# Patient Record
Sex: Female | Born: 1945 | Race: White | Hispanic: No | Marital: Married | Smoking: Former smoker
Health system: Southern US, Community
[De-identification: ages and names within clinical notes are randomized; demographics above are authoritative.]

## PROBLEM LIST (undated history)

## (undated) DIAGNOSIS — H269 Unspecified cataract: Secondary | ICD-10-CM

## (undated) DIAGNOSIS — M1990 Unspecified osteoarthritis, unspecified site: Secondary | ICD-10-CM

## (undated) DIAGNOSIS — Z86010 Personal history of colonic polyps: Secondary | ICD-10-CM

## (undated) DIAGNOSIS — M722 Plantar fascial fibromatosis: Secondary | ICD-10-CM

## (undated) DIAGNOSIS — I10 Essential (primary) hypertension: Secondary | ICD-10-CM

## (undated) DIAGNOSIS — M79673 Pain in unspecified foot: Secondary | ICD-10-CM

## (undated) DIAGNOSIS — M810 Age-related osteoporosis without current pathological fracture: Secondary | ICD-10-CM

## (undated) DIAGNOSIS — E119 Type 2 diabetes mellitus without complications: Secondary | ICD-10-CM

## (undated) DIAGNOSIS — K219 Gastro-esophageal reflux disease without esophagitis: Secondary | ICD-10-CM

## (undated) HISTORY — PX: OTHER SURGICAL HISTORY: SHX169

## (undated) HISTORY — PX: BREAST LUMPECTOMY: SHX2

## (undated) HISTORY — DX: Personal history of colonic polyps: Z86.010

## (undated) HISTORY — DX: Type 2 diabetes mellitus without complications: E11.9

## (undated) HISTORY — DX: Unspecified cataract: H26.9

## (undated) HISTORY — DX: Plantar fascial fibromatosis: M72.2

## (undated) HISTORY — DX: Age-related osteoporosis without current pathological fracture: M81.0

## (undated) HISTORY — PX: FINGER SURGERY: SHX640

## (undated) HISTORY — DX: Pain in unspecified foot: M79.673

## (undated) HISTORY — PX: COLONOSCOPY: SHX174

---

## 2006-08-21 ENCOUNTER — Ambulatory Visit: Payer: Self-pay | Admitting: Internal Medicine

## 2006-09-04 ENCOUNTER — Ambulatory Visit: Payer: Self-pay | Admitting: Internal Medicine

## 2006-09-04 ENCOUNTER — Encounter: Payer: Self-pay | Admitting: Internal Medicine

## 2009-07-19 ENCOUNTER — Encounter (INDEPENDENT_AMBULATORY_CARE_PROVIDER_SITE_OTHER): Payer: Self-pay | Admitting: *Deleted

## 2009-08-20 ENCOUNTER — Encounter: Admission: RE | Admit: 2009-08-20 | Discharge: 2009-08-20 | Payer: Self-pay | Admitting: Orthopaedic Surgery

## 2009-12-21 ENCOUNTER — Telehealth: Payer: Self-pay | Admitting: Internal Medicine

## 2011-01-04 ENCOUNTER — Encounter
Admission: RE | Admit: 2011-01-04 | Discharge: 2011-01-09 | Payer: Self-pay | Source: Home / Self Care | Attending: Podiatry | Admitting: Podiatry

## 2011-01-11 ENCOUNTER — Ambulatory Visit: Payer: Medicare Other | Attending: Podiatry | Admitting: Physical Therapy

## 2011-01-11 DIAGNOSIS — R5381 Other malaise: Secondary | ICD-10-CM | POA: Insufficient documentation

## 2011-01-11 DIAGNOSIS — M25673 Stiffness of unspecified ankle, not elsewhere classified: Secondary | ICD-10-CM | POA: Insufficient documentation

## 2011-01-11 DIAGNOSIS — R269 Unspecified abnormalities of gait and mobility: Secondary | ICD-10-CM | POA: Insufficient documentation

## 2011-01-11 DIAGNOSIS — M25676 Stiffness of unspecified foot, not elsewhere classified: Secondary | ICD-10-CM | POA: Insufficient documentation

## 2011-01-11 DIAGNOSIS — IMO0001 Reserved for inherently not codable concepts without codable children: Secondary | ICD-10-CM | POA: Insufficient documentation

## 2011-01-11 DIAGNOSIS — M25579 Pain in unspecified ankle and joints of unspecified foot: Secondary | ICD-10-CM | POA: Insufficient documentation

## 2011-01-11 NOTE — Progress Notes (Signed)
Summary: Changed practices--GI  Phone Note Outgoing Call   Call placed by: Harlow Mares CMA Duncan Dull),  December 21, 2009 1:47 PM Call placed to: Patient Summary of Call: patient states that she changed insurance and she changed practices and has already had a colonoscopy done. I had Lady Gary put a not in the system that the patient changed practices. Initial call taken by: Harlow Mares CMA (AAMA),  December 21, 2009 1:49 PM

## 2011-01-16 ENCOUNTER — Ambulatory Visit: Payer: Medicare Other | Admitting: *Deleted

## 2011-01-18 ENCOUNTER — Ambulatory Visit: Payer: Medicare Other | Admitting: Physical Therapy

## 2011-01-23 ENCOUNTER — Ambulatory Visit: Payer: Medicare Other | Admitting: Physical Therapy

## 2011-01-25 ENCOUNTER — Ambulatory Visit: Payer: Medicare Other | Admitting: *Deleted

## 2011-01-30 ENCOUNTER — Ambulatory Visit: Payer: Medicare Other | Admitting: Physical Therapy

## 2011-02-01 ENCOUNTER — Ambulatory Visit: Payer: Medicare Other | Admitting: Physical Therapy

## 2011-02-06 ENCOUNTER — Ambulatory Visit: Payer: Medicare Other | Admitting: *Deleted

## 2011-02-08 ENCOUNTER — Ambulatory Visit: Payer: Medicare Other | Attending: Podiatry | Admitting: *Deleted

## 2011-02-08 DIAGNOSIS — M25673 Stiffness of unspecified ankle, not elsewhere classified: Secondary | ICD-10-CM | POA: Insufficient documentation

## 2011-02-08 DIAGNOSIS — M25579 Pain in unspecified ankle and joints of unspecified foot: Secondary | ICD-10-CM | POA: Insufficient documentation

## 2011-02-08 DIAGNOSIS — IMO0001 Reserved for inherently not codable concepts without codable children: Secondary | ICD-10-CM | POA: Insufficient documentation

## 2011-02-08 DIAGNOSIS — R269 Unspecified abnormalities of gait and mobility: Secondary | ICD-10-CM | POA: Insufficient documentation

## 2011-02-08 DIAGNOSIS — M25676 Stiffness of unspecified foot, not elsewhere classified: Secondary | ICD-10-CM | POA: Insufficient documentation

## 2011-02-08 DIAGNOSIS — R5381 Other malaise: Secondary | ICD-10-CM | POA: Insufficient documentation

## 2011-03-22 ENCOUNTER — Ambulatory Visit (INDEPENDENT_AMBULATORY_CARE_PROVIDER_SITE_OTHER): Payer: Medicare Other | Admitting: Sports Medicine

## 2011-03-22 ENCOUNTER — Encounter: Payer: Self-pay | Admitting: Sports Medicine

## 2011-03-22 VITALS — BP 147/85 | Ht 61.0 in | Wt 200.0 lb

## 2011-03-22 DIAGNOSIS — M79609 Pain in unspecified limb: Secondary | ICD-10-CM

## 2011-03-22 DIAGNOSIS — M79672 Pain in left foot: Secondary | ICD-10-CM

## 2011-03-22 DIAGNOSIS — M7672 Peroneal tendinitis, left leg: Secondary | ICD-10-CM

## 2011-03-22 DIAGNOSIS — M7750 Other enthesopathy of unspecified foot: Secondary | ICD-10-CM

## 2011-03-22 DIAGNOSIS — M722 Plantar fascial fibromatosis: Secondary | ICD-10-CM

## 2011-03-22 MED ORDER — TRAMADOL HCL 50 MG PO TABS: 50.0000 mg | ORAL_TABLET | Freq: Three times a day (TID) | ORAL | Status: DC | PRN

## 2011-03-22 NOTE — Progress Notes (Signed)
  Subjective:    Patient ID: Robin Lindsey, female    DOB: 27-Dec-1945, 65 y.o.   MRN: [id number]  HPI New patient is here today for her left heel pain that has been previously diagnosed as plantar fasciitis with spurring. She has had this problem since May of last year. She has previously had 6 cortisone injections in the area along with 11 sessions of physical therapy. This was by Dr Pricilla Holm a podiatrist who comes to [facility].  She is going to be [place] facility at [facility] for sessions  in the past. She has tried diclofenac and other NSAIDs which she now  feels she is allergic to due to  GI issues.  Now the pain is more laterally on the left heel. She has also tried and bought a TENS unit that she uses but without relief.   Review of Systems     Objective:   Physical Exam    NAD Left foot Only mildly TTP at med insertion of PF AT non tender Peroneals show swelling on left Lateral foot breakdown w curving of toes 2 - 5 and lat rotation Same changes RT but less Intact Post tib on standing heel raise Full ROM of left ankle  Some pain at back of heel with ankle motion  Resisted testing of strength did not bring out pain  Musculoskeletal ultrasound There is moderate swelling around the peroneal tendons on the left only at the level of the posterior and inferior malleolus. There is a thickened left plantar fascia measuring 0.71 cm and this compares to 0.38 cm on the right. On the lateral portion of the calcaneus there is a spur with some hypoechoic change and some slight fragmentation of the cortex Achilles tendon appears normal bilaterally.    Assessment & Plan:

## 2011-03-28 ENCOUNTER — Other Ambulatory Visit: Payer: Self-pay | Admitting: Dermatology

## 2011-03-28 DIAGNOSIS — M722 Plantar fascial fibromatosis: Secondary | ICD-10-CM | POA: Insufficient documentation

## 2011-03-28 DIAGNOSIS — M79672 Pain in left foot: Secondary | ICD-10-CM | POA: Insufficient documentation

## 2011-03-28 DIAGNOSIS — M7672 Peroneal tendinitis, left leg: Secondary | ICD-10-CM | POA: Insufficient documentation

## 2011-03-28 NOTE — Assessment & Plan Note (Signed)
This seems to be associated on the lateral side with a spur that she is developed from walking abnormally. With this in mind I think we should try a soft heel cup to try to cushion this area that she can use in all of her shoes.

## 2011-03-28 NOTE — Assessment & Plan Note (Signed)
I suspect the peroneal tendinitis is also a compensation injury from walking abnormally. We will have her ice this area. Begin some gentle resistance and range of motion exercises. Okay to use when necessary ibuprofen or Aleve.

## 2011-03-28 NOTE — Assessment & Plan Note (Signed)
She was started on a series of stretches and exercises to try to help her plantar fascia. In addition we changed the padding and support for her shoe.  We tried to make adjustments today but I'm not sure that she will be able to continue to use the custom orthotics that were made. We did use arch straps. We also added a soft heel cup.  I would like to check this back in 6 weeks.

## 2011-05-08 ENCOUNTER — Ambulatory Visit (INDEPENDENT_AMBULATORY_CARE_PROVIDER_SITE_OTHER): Payer: Medicare Other | Admitting: Sports Medicine

## 2011-05-08 DIAGNOSIS — M722 Plantar fascial fibromatosis: Secondary | ICD-10-CM

## 2011-05-08 DIAGNOSIS — M7750 Other enthesopathy of unspecified foot: Secondary | ICD-10-CM

## 2011-05-08 DIAGNOSIS — M79672 Pain in left foot: Secondary | ICD-10-CM

## 2011-05-08 DIAGNOSIS — M79609 Pain in unspecified limb: Secondary | ICD-10-CM

## 2011-05-08 DIAGNOSIS — M7672 Peroneal tendinitis, left leg: Secondary | ICD-10-CM

## 2011-05-08 MED ORDER — NITROGLYCERIN 0.2 MG/HR TD PT24: MEDICATED_PATCH | TRANSDERMAL | Status: DC

## 2011-05-08 NOTE — Assessment & Plan Note (Signed)
Not much change in pain level. Unfortunately we do not have any medications that seem to be a good choice for her. I would ask her to continue icing.

## 2011-05-08 NOTE — Assessment & Plan Note (Addendum)
Will start nitroglycerin patches today.  Given sports insoles for additional cushion with heel wedge with focus on increased support on lateral side.  Follow-up in 6 weeks.  She was advised that the nitroglycerin treatment is based on the possibility that most of her pain is coming from the peroneal tendons.  For the left foot we placed a lateral wedge so that we can support and take pressure off of the peroneal tendons. Recheck this in 6 weeks.

## 2011-05-08 NOTE — Assessment & Plan Note (Signed)
Continue exercises for this. This may be chronic change and not the key reason why she has ongoing pain.

## 2011-05-08 NOTE — Progress Notes (Signed)
  Subjective:    Patient ID: Robin Lindsey, female    DOB: 11-11-1946, 65 y.o.   MRN: [id number]  HPI Here for 6 week follow-up of left foot pain  Was thought to be multifactorial due to enlarged left plantar fascia, lateral calcaneal spur, and peroneal tendinitis from compensation.  Has been doing stretches daily, not using heel cups as she was wearing open heeled shoes.  Notes no improvement in pain.  Still using TENS on her foot pain.  Cannot tolerate ultram or NSAIDS.  Tylenol with minimal relief.  Tramadol caused nausea  This patient has had multiple interventions, several injections and has had custom orthotics made. None of the previous treatments have eliminated her left foot pain.   Review of Systems See HPI    Objective:   Physical Exam NAD  Left foot  No TTP at med insertion of PF  No TTP over peroneal tendon but notable swelling of tendon sheath. Full ROM of left ankle  Resisted testing of strength did not bring out pain Pain in heel on weight bearing         Assessment & Plan:

## 2011-12-09 ENCOUNTER — Encounter (HOSPITAL_COMMUNITY): Payer: Self-pay | Admitting: *Deleted

## 2011-12-09 ENCOUNTER — Emergency Department (HOSPITAL_COMMUNITY)
Admission: EM | Admit: 2011-12-09 | Discharge: 2011-12-09 | Disposition: A | Discharge status: Home / Self Care | Payer: Medicare Other | Source: Home / Self Care | Attending: Emergency Medicine | Admitting: Emergency Medicine

## 2011-12-09 DIAGNOSIS — J40 Bronchitis, not specified as acute or chronic: Secondary | ICD-10-CM

## 2011-12-09 DIAGNOSIS — J069 Acute upper respiratory infection, unspecified: Secondary | ICD-10-CM

## 2011-12-09 HISTORY — DX: Essential (primary) hypertension: I10

## 2011-12-09 MED ORDER — BENZONATATE 200 MG PO CAPS: 200.0000 mg | ORAL_CAPSULE | Freq: Three times a day (TID) | ORAL | Status: AC | PRN

## 2011-12-09 MED ORDER — AZITHROMYCIN 250 MG PO TABS: ORAL_TABLET | ORAL | Status: AC

## 2011-12-09 MED ORDER — ALBUTEROL SULFATE HFA 108 (90 BASE) MCG/ACT IN AERS: 1.0000 | INHALATION_SPRAY | Freq: Four times a day (QID) | RESPIRATORY_TRACT | Status: DC | PRN

## 2011-12-09 NOTE — ED Provider Notes (Signed)
History     CSN: [id number]  Arrival date & time 12/09/11  0927   First MD Initiated Contact with Patient 12/09/11 [phone]      Chief Complaint  Patient presents with  . Nasal Congestion  . Cough  . Joint Pain    (Consider location/radiation/quality/duration/timing/severity/associated sxs/prior treatment) HPI Comments: Robin Lindsey is a 65 year old female who has had a five-day history of chest congestion, a raw feeling in her chest, cough productive of yellow-brown sputum, nasal congestion with clear rhinorrhea, scratchy throat, left ear pain, and has felt feverish, chilled, and aching. She has had no specific exposures.  Patient is a 65 y.o. female presenting with cough.  Cough Associated symptoms include chills, ear pain, rhinorrhea and sore throat. Pertinent negatives include no shortness of breath, no wheezing and no eye redness.    Past Medical History  Diagnosis Date  . Hypertension     Past Surgical History  Procedure Date  . Breast lumpectomy     History reviewed. No pertinent family history.  History  Substance Use Topics  . Smoking status: Never Smoker   . Smokeless tobacco: Not on file  . Alcohol Use: No    OB History    Grav Para Term Preterm Abortions TAB SAB Ect Mult Living                  Review of Systems  Constitutional: Positive for fever, chills and fatigue.  HENT: Positive for ear pain, congestion, sore throat and rhinorrhea. Negative for sneezing, neck stiffness, voice change and postnasal drip.   Eyes: Negative for pain, discharge and redness.  Respiratory: Positive for cough. Negative for chest tightness, shortness of breath and wheezing.   Gastrointestinal: Negative for nausea, vomiting, abdominal pain and diarrhea.  Skin: Negative for rash.    Allergies  Aspirin and Nsaids  Home Medications   Current Outpatient Rx  Name Route Sig Dispense Refill  . GUAIFENESIN ER 600 MG PO TB12 Oral Take 600 mg by mouth 2 (two) times daily.      Marland Kitchen  OMEPRAZOLE 20 MG PO CPDR Oral Take 20 mg by mouth 2 (two) times daily as needed.      Marland Kitchen VALSARTAN-HYDROCHLOROTHIAZIDE 320-25 MG PO TABS Oral Take 1 tablet by mouth daily.      . ALBUTEROL SULFATE HFA 108 (90 BASE) MCG/ACT IN AERS Inhalation Inhale 1-2 puffs into the lungs every 6 (six) hours as needed for wheezing. 1 Inhaler 0  . AZITHROMYCIN 250 MG PO TABS  Take as directed. 6 tablet 0  . BENZONATATE 200 MG PO CAPS Oral Take 1 capsule (200 mg total) by mouth 3 (three) times daily as needed for cough. 30 capsule 0  . NITROGLYCERIN 0.2 MG/HR TD PT24  Apply one-quarter patch to affected area daily.  Remove after 24 hours. 30 patch 1    BP 137/71  Pulse 93  Temp(Src) 98.7 F (37.1 C) (Oral)  Resp 18  SpO2 97%  Physical Exam  Nursing note and vitals reviewed. Constitutional: She appears well-developed and well-nourished. No distress.  HENT:  Head: Normocephalic and atraumatic.  Right Ear: External ear normal.  Left Ear: External ear normal.  Nose: Nose normal.  Mouth/Throat: Oropharynx is clear and moist. No oropharyngeal exudate.  Eyes: Conjunctivae and EOM are normal. Pupils are equal, round, and reactive to light. Right eye exhibits no discharge. Left eye exhibits no discharge.  Neck: Normal range of motion. Neck supple.  Cardiovascular: Normal rate, regular rhythm and normal heart sounds.  Pulmonary/Chest: Effort normal and breath sounds normal. No stridor. No respiratory distress. She has no wheezes. She has no rales. She exhibits no tenderness.  Lymphadenopathy:    She has no cervical adenopathy.  Skin: Skin is warm and dry. No rash noted. She is not diaphoretic.    ED Course  Procedures (including critical care time)  Labs Reviewed - No data to display No results found.   1. Viral upper respiratory illness   2. Bronchitis       MDM  She has a viral upper respiratory infection with bronchitis. We'll treat with a Z-Pak, Tessalon Perles, and albuterol  inhaler.        Roque Lias, MD 12/09/11 [phone]

## 2011-12-09 NOTE — ED Notes (Signed)
Pt with c/o congestion/cough/joint pain onset of symptoms x 4 days - ribs sore from coughing

## 2011-12-25 DIAGNOSIS — H524 Presbyopia: Secondary | ICD-10-CM | POA: Diagnosis not present

## 2011-12-25 DIAGNOSIS — H2510 Age-related nuclear cataract, unspecified eye: Secondary | ICD-10-CM | POA: Diagnosis not present

## 2011-12-25 DIAGNOSIS — H40019 Open angle with borderline findings, low risk, unspecified eye: Secondary | ICD-10-CM | POA: Diagnosis not present

## 2011-12-25 DIAGNOSIS — H5200 Hypermetropia, unspecified eye: Secondary | ICD-10-CM | POA: Diagnosis not present

## 2012-01-08 DIAGNOSIS — H2510 Age-related nuclear cataract, unspecified eye: Secondary | ICD-10-CM | POA: Diagnosis not present

## 2012-01-08 DIAGNOSIS — H3589 Other specified retinal disorders: Secondary | ICD-10-CM | POA: Diagnosis not present

## 2012-01-08 DIAGNOSIS — H40019 Open angle with borderline findings, low risk, unspecified eye: Secondary | ICD-10-CM | POA: Diagnosis not present

## 2012-01-08 DIAGNOSIS — H40039 Anatomical narrow angle, unspecified eye: Secondary | ICD-10-CM | POA: Diagnosis not present

## 2012-01-28 DIAGNOSIS — H35379 Puckering of macula, unspecified eye: Secondary | ICD-10-CM | POA: Diagnosis not present

## 2012-01-28 DIAGNOSIS — H4011X0 Primary open-angle glaucoma, stage unspecified: Secondary | ICD-10-CM | POA: Diagnosis not present

## 2012-01-28 DIAGNOSIS — H2589 Other age-related cataract: Secondary | ICD-10-CM | POA: Diagnosis not present

## 2012-01-29 DIAGNOSIS — M722 Plantar fascial fibromatosis: Secondary | ICD-10-CM | POA: Diagnosis not present

## 2012-01-29 DIAGNOSIS — M25569 Pain in unspecified knee: Secondary | ICD-10-CM | POA: Diagnosis not present

## 2012-01-29 DIAGNOSIS — IMO0002 Reserved for concepts with insufficient information to code with codable children: Secondary | ICD-10-CM | POA: Diagnosis not present

## 2012-02-08 ENCOUNTER — Encounter (HOSPITAL_COMMUNITY): Payer: Self-pay | Admitting: Pharmacy Technician

## 2012-02-11 ENCOUNTER — Encounter (HOSPITAL_COMMUNITY): Payer: Self-pay

## 2012-02-11 ENCOUNTER — Other Ambulatory Visit: Payer: Self-pay

## 2012-02-11 ENCOUNTER — Encounter (HOSPITAL_COMMUNITY)
Admission: RE | Admit: 2012-02-11 | Discharge: 2012-02-11 | Disposition: A | Discharge status: Home / Self Care | Payer: Medicare Other | Source: Ambulatory Visit | Attending: Ophthalmology | Admitting: Ophthalmology

## 2012-02-11 DIAGNOSIS — Z01810 Encounter for preprocedural cardiovascular examination: Secondary | ICD-10-CM | POA: Diagnosis not present

## 2012-02-11 DIAGNOSIS — I10 Essential (primary) hypertension: Secondary | ICD-10-CM | POA: Diagnosis not present

## 2012-02-11 DIAGNOSIS — H2589 Other age-related cataract: Secondary | ICD-10-CM | POA: Diagnosis not present

## 2012-02-11 DIAGNOSIS — Z79899 Other long term (current) drug therapy: Secondary | ICD-10-CM | POA: Diagnosis not present

## 2012-02-11 DIAGNOSIS — Z01812 Encounter for preprocedural laboratory examination: Secondary | ICD-10-CM | POA: Diagnosis not present

## 2012-02-11 HISTORY — DX: Unspecified osteoarthritis, unspecified site: M19.90

## 2012-02-11 HISTORY — DX: Gastro-esophageal reflux disease without esophagitis: K21.9

## 2012-02-11 LAB — BASIC METABOLIC PANEL
Chloride: 102 mEq/L (ref 96–112)
GFR calc Af Amer: 69 mL/min — ABNORMAL LOW (ref >90)
GFR calc non Af Amer: 60 mL/min — ABNORMAL LOW (ref >90)
Potassium: 3.9 mEq/L (ref 3.5–5.1)
Sodium: 140 mEq/L (ref 135–145)

## 2012-02-11 LAB — HEMOGLOBIN AND HEMATOCRIT, BLOOD
HCT: 40.3 % (ref 36.0–46.0)
Hemoglobin: 13.5 g/dL (ref 12.0–15.0)

## 2012-02-11 NOTE — Patient Instructions (Addendum)
20 Robin Lindsey  02/11/2012   Your procedure is scheduled on:  02/14/12  Report to [facility] at 10:20 AM.  Call this number if you have problems Robin morning of surgery: [phone]   Remember:   Do not eat food:After Midnight.  May have clear liquids:until Midnight .  Clear liquids include soda, tea, black coffee, apple or grape juice, broth.  Take these medicines Robin morning of surgery with A SIP OF WATER: Prilosec and Diovan-HCT.   Do not wear jewelry, make-up or nail polish.  Do not wear lotions, powders, or perfumes. You may wear deodorant.  Do not shave 48 hours prior to surgery.  Do not bring valuables to Robin hospital.  Contacts, dentures or bridgework may not be worn into surgery.  Leave suitcase in Robin car. After surgery it may be brought to your room.  For patients admitted to Robin hospital, checkout time is 11:00 AM Robin day of discharge.   Patients discharged Robin day of surgery will not be allowed to drive home.  Name and phone number of your driver:   Special Instructions: N/A   Please read over Robin following fact sheets that you were given: Pain Booklet, Anesthesia Post-op Instructions and Care and Recovery After Surgery    Cataract A cataract is a clouding of Robin lens of Robin eye. When a lens becomes cloudy, vision is reduced based on Robin degree and nature of Robin clouding. Many cataracts reduce vision to some degree. Some cataracts make people more near-sighted as they develop. Other cataracts increase glare. Cataracts that are ignored and become worse can sometimes look Robin Lindsey. Robin Lindsey color can be seen through Robin pupil. CAUSES   Aging. However, cataracts may occur at any age, even in newborns.   Certain drugs.   Trauma to Robin eye.   Certain diseases such as diabetes.   Specific eye diseases such as chronic inflammation inside Robin eye or a sudden attack of a rare form of glaucoma.   Inherited or acquired medical problems.  SYMPTOMS   Gradual, progressive drop in  vision in Robin affected eye.   Severe, rapid visual loss. This most often happens when trauma is Robin cause.  DIAGNOSIS  To detect a cataract, an eye doctor examines Robin lens. Cataracts are best diagnosed with an exam of Robin eyes with Robin pupils enlarged (dilated) by drops.  TREATMENT  For an early cataract, vision may improve by using different eyeglasses or stronger lighting. If that does not help your vision, surgery is Robin only effective treatment. A cataract needs to be surgically removed when vision loss interferes with your everyday activities, such as driving, reading, or watching TV. A cataract may also have to be removed if it prevents examination or treatment of another eye problem. Surgery removes Robin cloudy lens and usually replaces it with a substitute lens (intraocular lens, IOL).  At a time when both you and your doctor agree, Robin cataract will be surgically removed. If you have cataracts in both eyes, only one is usually removed at a time. This allows Robin operated eye to heal and be out of danger from any possible problems after surgery (such as infection or poor wound healing). In rare cases, a cataract may be doing damage to your eye. In these cases, your caregiver may advise surgical removal right away. Robin vast majority of people who have cataract surgery have better vision afterward. HOME CARE INSTRUCTIONS  If you are not planning surgery, you may be asked to do  Robin following:  Use different eyeglasses.   Use stronger or brighter lighting.   Ask your eye doctor about reducing your medicine dose or changing medicines if it is thought that a medicine caused your cataract. Changing medicines does not make Robin cataract go away on its own.   Become familiar with your surroundings. Poor vision can lead to injury. Avoid bumping into things on Robin affected side. You are at a higher risk for tripping or falling.   Exercise extreme care when driving or operating machinery.   Wear  sunglasses if you are sensitive to bright light or experiencing problems with glare.  SEEK IMMEDIATE MEDICAL CARE IF:   You have a worsening or sudden vision loss.   You notice redness, swelling, or increasing pain in Robin eye.   You have a fever.  Document Released: 11/26/2005 Document Revised: 11/15/2011 Document Reviewed: 07/20/2011 [facility] Patient Information 2012 [place], [place].   PATIENT INSTRUCTIONS POST-ANESTHESIA  IMMEDIATELY FOLLOWING SURGERY:  Do not drive or operate machinery for Robin first twenty four hours after surgery.  Do not make any important decisions for twenty four hours after surgery or while taking narcotic pain medications or sedatives.  If you develop intractable nausea and vomiting or a severe headache please notify your doctor immediately.  FOLLOW-UP:  Please make an appointment with your surgeon as instructed. You do not need to follow up with anesthesia unless specifically instructed to do so.  WOUND CARE INSTRUCTIONS (if applicable):  Keep a dry clean dressing on Robin anesthesia/puncture wound site if there is drainage.  Once Robin wound has quit draining you may leave it open to air.  Generally you should leave Robin bandage intact for twenty four hours unless there is drainage.  If Robin epidural site drains for more than 36-48 hours please call Robin anesthesia department.  QUESTIONS?:  Please feel free to call your physician or Robin hospital operator if you have any questions, and they will be happy to assist you.     [facility] Anesthesia Department [address] [place] [phone]

## 2012-02-13 MED ORDER — NEOMYCIN-POLYMYXIN-DEXAMETH 3.5-10000-0.1 OP OINT
TOPICAL_OINTMENT | OPHTHALMIC | Status: AC
  Filled 2012-02-13: qty 3.5

## 2012-02-13 MED ORDER — LIDOCAINE HCL 3.5 % OP GEL
OPHTHALMIC | Status: AC
  Administered 2012-02-14: 1 via OPHTHALMIC
  Filled 2012-02-13: qty 5

## 2012-02-13 MED ORDER — LIDOCAINE HCL (PF) 1 % IJ SOLN
INTRAMUSCULAR | Status: AC
  Filled 2012-02-13: qty 2

## 2012-02-13 MED ORDER — TETRACAINE HCL 0.5 % OP SOLN
OPHTHALMIC | Status: AC
  Administered 2012-02-14: 1 [drp] via OPHTHALMIC
  Filled 2012-02-13: qty 2

## 2012-02-13 MED ORDER — CYCLOPENTOLATE-PHENYLEPHRINE 0.2-1 % OP SOLN
OPHTHALMIC | Status: AC
  Administered 2012-02-14: 1 [drp] via OPHTHALMIC
  Filled 2012-02-13: qty 2

## 2012-02-14 ENCOUNTER — Ambulatory Visit (HOSPITAL_COMMUNITY): Payer: Medicare Other | Admitting: Anesthesiology

## 2012-02-14 ENCOUNTER — Encounter (HOSPITAL_COMMUNITY)
Admission: RE | Disposition: A | Discharge status: Home / Self Care | Payer: Self-pay | Source: Ambulatory Visit | Attending: Ophthalmology

## 2012-02-14 ENCOUNTER — Encounter (HOSPITAL_COMMUNITY): Payer: Self-pay | Admitting: *Deleted

## 2012-02-14 ENCOUNTER — Ambulatory Visit (HOSPITAL_COMMUNITY)
Admission: RE | Admit: 2012-02-14 | Discharge: 2012-02-14 | Disposition: A | Discharge status: Home / Self Care | Payer: Medicare Other | Source: Ambulatory Visit | Attending: Ophthalmology | Admitting: Ophthalmology

## 2012-02-14 ENCOUNTER — Encounter (HOSPITAL_COMMUNITY): Payer: Self-pay | Admitting: Anesthesiology

## 2012-02-14 DIAGNOSIS — I10 Essential (primary) hypertension: Secondary | ICD-10-CM | POA: Diagnosis not present

## 2012-02-14 DIAGNOSIS — Z79899 Other long term (current) drug therapy: Secondary | ICD-10-CM | POA: Diagnosis not present

## 2012-02-14 DIAGNOSIS — Z01812 Encounter for preprocedural laboratory examination: Secondary | ICD-10-CM | POA: Insufficient documentation

## 2012-02-14 DIAGNOSIS — H2589 Other age-related cataract: Secondary | ICD-10-CM | POA: Diagnosis not present

## 2012-02-14 DIAGNOSIS — Z01810 Encounter for preprocedural cardiovascular examination: Secondary | ICD-10-CM | POA: Insufficient documentation

## 2012-02-14 DIAGNOSIS — H269 Unspecified cataract: Secondary | ICD-10-CM | POA: Diagnosis not present

## 2012-02-14 HISTORY — PX: CATARACT EXTRACTION W/PHACO: SHX586

## 2012-02-14 SURGERY — PHACOEMULSIFICATION, CATARACT, WITH IOL INSERTION
Anesthesia: Monitor Anesthesia Care | Site: Eye | Laterality: Left | Wound class: Clean

## 2012-02-14 MED ORDER — BSS IO SOLN
INTRAOCULAR | Status: DC | PRN
  Administered 2012-02-14: 15 mL via INTRAOCULAR

## 2012-02-14 MED ORDER — FENTANYL CITRATE 0.05 MG/ML IJ SOLN: 25.0000 ug | INTRAMUSCULAR | Status: DC | PRN

## 2012-02-14 MED ORDER — ONDANSETRON HCL 4 MG/2ML IJ SOLN: 4.0000 mg | Freq: Once | INTRAMUSCULAR | Status: DC | PRN

## 2012-02-14 MED ORDER — PROVISC 10 MG/ML IO SOLN
INTRAOCULAR | Status: DC | PRN
  Administered 2012-02-14: 8.5 mg via INTRAOCULAR

## 2012-02-14 MED ORDER — MIDAZOLAM HCL 2 MG/2ML IJ SOLN
INTRAMUSCULAR | Status: AC
  Filled 2012-02-14: qty 2

## 2012-02-14 MED ORDER — TETRACAINE HCL 0.5 % OP SOLN
1.0000 [drp] | OPHTHALMIC | Status: AC
  Administered 2012-02-14 (×3): 1 [drp] via OPHTHALMIC

## 2012-02-14 MED ORDER — LACTATED RINGERS IV SOLN
INTRAVENOUS | Status: DC
  Administered 2012-02-14: 1000 mL via INTRAVENOUS

## 2012-02-14 MED ORDER — MIDAZOLAM HCL 2 MG/2ML IJ SOLN
1.0000 mg | INTRAMUSCULAR | Status: DC | PRN
  Administered 2012-02-14 (×2): 2 mg via INTRAVENOUS

## 2012-02-14 MED ORDER — MIDAZOLAM HCL 5 MG/5ML IJ SOLN
INTRAMUSCULAR | Status: DC | PRN
  Administered 2012-02-14: 2 mg via INTRAVENOUS

## 2012-02-14 MED ORDER — MIDAZOLAM HCL 2 MG/2ML IJ SOLN
INTRAMUSCULAR | Status: AC
  Administered 2012-02-14: 2 mg via INTRAVENOUS
  Filled 2012-02-14: qty 2

## 2012-02-14 MED ORDER — ACETAZOLAMIDE 250 MG PO TABS
ORAL_TABLET | ORAL | Status: AC
  Filled 2012-02-14: qty 1

## 2012-02-14 MED ORDER — POVIDONE-IODINE 5 % OP SOLN
OPHTHALMIC | Status: DC | PRN
  Administered 2012-02-14: 1 via OPHTHALMIC

## 2012-02-14 MED ORDER — LIDOCAINE 3.5 % OP GEL OPTIME - NO CHARGE
OPHTHALMIC | Status: DC | PRN
  Administered 2012-02-14: 2 [drp] via OPHTHALMIC

## 2012-02-14 MED ORDER — PHENYLEPHRINE HCL 2.5 % OP SOLN
1.0000 [drp] | OPHTHALMIC | Status: AC
  Administered 2012-02-14 (×3): 1 [drp] via OPHTHALMIC

## 2012-02-14 MED ORDER — NEOMYCIN-POLYMYXIN-DEXAMETH 0.1 % OP OINT
TOPICAL_OINTMENT | OPHTHALMIC | Status: DC | PRN
  Administered 2012-02-14: 1 via OPHTHALMIC

## 2012-02-14 MED ORDER — LIDOCAINE HCL (PF) 1 % IJ SOLN
INTRAMUSCULAR | Status: DC | PRN
  Administered 2012-02-14: .4 mL

## 2012-02-14 MED ORDER — PHENYLEPHRINE HCL 2.5 % OP SOLN
OPHTHALMIC | Status: AC
  Administered 2012-02-14: 1 [drp] via OPHTHALMIC
  Filled 2012-02-14: qty 2

## 2012-02-14 MED ORDER — CYCLOPENTOLATE-PHENYLEPHRINE 0.2-1 % OP SOLN
1.0000 [drp] | OPHTHALMIC | Status: AC
  Administered 2012-02-14 (×3): 1 [drp] via OPHTHALMIC

## 2012-02-14 MED ORDER — EPINEPHRINE HCL 1 MG/ML IJ SOLN
INTRAOCULAR | Status: DC | PRN
  Administered 2012-02-14: 11:00:00

## 2012-02-14 MED ORDER — ACETAZOLAMIDE 250 MG PO TABS
250.0000 mg | ORAL_TABLET | Freq: Once | ORAL | Status: AC
  Administered 2012-02-14: 250 mg via ORAL

## 2012-02-14 MED ORDER — LIDOCAINE HCL 3.5 % OP GEL
1.0000 "application " | Freq: Once | OPHTHALMIC | Status: AC
  Administered 2012-02-14: 1 via OPHTHALMIC

## 2012-02-14 MED ORDER — EPINEPHRINE HCL 1 MG/ML IJ SOLN
INTRAMUSCULAR | Status: AC
  Filled 2012-02-14: qty 1

## 2012-02-14 SURGICAL SUPPLY — 32 items

## 2012-02-14 NOTE — Discharge Instructions (Signed)
Robin Lindsey  02/14/2012     Instructions  1. Use medications as Instructed.  Shake well before use. Wait 5 minutes between drops.  {OPHTHALMIC ANTIBIOTICS:22167} 4 times a day x 1 week.  {OPHTHALMIC ANTI-INFLAMMATORY:22168} 2 times a day x 4 weeks.  {OPHTHALMIC STEROID:22169} 4 times a day - week 1   3 times a day - Week 2, 2 times a day- Week 3, 1 time a day - Week 4.  2. Do not rub the operative eye. Do not swim underwater for 2 weeks.  3. You may remove the clear shield and resume your normal activities the day after  Surgery. Your eyes may feel more comfortable if you wear dark glasses outside.  4. Call our office at [phone] if you have sudden change in vision, extreme redness or pain. Some fluctuation in vision is normal after surgery. If you have an emergency after hours, call Dr. Alto Denver at [phone].  5. It is important that you attend all of your follow-up appointments.        Follow-up:{follow up:32580} with Gemma Payor, MD.   Dr. Lahoma Crocker: [phone]  Dr. Lita Mains: [phone]  Dr. Alto Denver: [phone]   If you find that you cannot contact your physician, but feel that your signs and   Symptoms warrant a physician's attention, call the Emergency Room at   [phone].   Other{NA AND WNUUVOZD:66440}.

## 2012-02-14 NOTE — Transfer of Care (Signed)
Immediate Anesthesia Transfer of Care Note  Patient: Robin Lindsey  Procedure(s) Performed: Procedure(s) (LRB): CATARACT EXTRACTION PHACO AND INTRAOCULAR LENS PLACEMENT (IOC) (Left)  Patient Location: Shortstay  Anesthesia Type: MAC  Level of Consciousness: awake  Airway & Oxygen Therapy: Patient Spontanous Breathing   Post-op Assessment: Report given to PACU RN, Post -op Vital signs reviewed and stable and Patient moving all extremities  Post vital signs: Reviewed and stable  Complications: No apparent anesthesia complications

## 2012-02-14 NOTE — H&P (Signed)
I have reviewed the H&P, the patient was re-examined, and I have identified no interval changes in medical condition and plan of care since the history and physical of record  

## 2012-02-14 NOTE — Anesthesia Procedure Notes (Signed)
Procedure Name: MAC Date/Time: 02/14/2012 11:25 AM Performed by: Minerva Areola Pre-anesthesia Checklist: Patient identified, Emergency Drugs available, Suction available, Timeout performed and Patient being monitored Patient Re-evaluated:Patient Re-evaluated prior to inductionOxygen Delivery Method: Nasal Cannula

## 2012-02-14 NOTE — Anesthesia Preprocedure Evaluation (Addendum)
Anesthesia Evaluation  Patient identified by MRN, date of birth, ID band Patient awake    Reviewed: Allergy & Precautions, H&P , NPO status , Patient's Chart, lab work & pertinent test results  History of Anesthesia Complications Negative for: history of anesthetic complications  Airway Mallampati: II      Dental  (+) Teeth Intact   Pulmonary neg pulmonary ROS,  breath sounds clear to auscultation        Cardiovascular hypertension, Pt. on medications Rhythm:Regular     Neuro/Psych    GI/Hepatic GERD-  Controlled and Medicated,  Endo/Other    Renal/GU      Musculoskeletal   Abdominal   Peds  Hematology   Anesthesia Other Findings   Reproductive/Obstetrics                           Anesthesia Physical Anesthesia Plan  ASA: II  Anesthesia Plan: MAC   Post-op Pain Management:    Induction: Intravenous  Airway Management Planned: Nasal Cannula  Additional Equipment:   Intra-op Plan:   Post-operative Plan:   Informed Consent: I have reviewed the patients History and Physical, chart, labs and discussed the procedure including the risks, benefits and alternatives for the proposed anesthesia with the patient or authorized representative who has indicated his/her understanding and acceptance.     Plan Discussed with:   Anesthesia Plan Comments:         Anesthesia Quick Evaluation  

## 2012-02-14 NOTE — Anesthesia Postprocedure Evaluation (Signed)
  Anesthesia Post-op Note  Patient: Robin Lindsey  Procedure(s) Performed: Procedure(s) (LRB): CATARACT EXTRACTION PHACO AND INTRAOCULAR LENS PLACEMENT (IOC) (Left)  Patient Location:  Short Stay  Anesthesia Type: MAC  Level of Consciousness: awake  Airway and Oxygen Therapy: Patient Spontanous Breathing  Post-op Pain: none  Post-op Assessment: Post-op Vital signs reviewed, Patient's Cardiovascular Status Stable, Respiratory Function Stable, Patent Airway, No signs of Nausea or vomiting and Pain level controlled  Post-op Vital Signs: Reviewed and stable  Complications: No apparent anesthesia complications

## 2012-02-14 NOTE — Brief Op Note (Signed)
Pre-Op Dx: Cataract OS Post-Op Dx: Cataract OS Surgeon: Deziray Nabi Anesthesia: Topical with MAC Implant: B&L enVista Specimen: None Complications: None 

## 2012-02-15 NOTE — Op Note (Signed)
NAMEDUCHESS, ARMENDAREZ NO.:  [id number]  MEDICAL RECORD NO.:  [id number]  LOCATION:  [facility]                          FACILITY:  [facility]  PHYSICIAN:  Susanne Greenhouse, MD       DATE OF BIRTH:  [birth year]  DATE OF PROCEDURE:  02/14/2012 DATE OF DISCHARGE:  02/14/2012                              OPERATIVE REPORT   PREOPERATIVE DIAGNOSIS:  Combined cataract, left eye, diagnosis code 366.19.  POSTOPERATIVE DIAGNOSIS:  Combined cataract, left eye, diagnosis code 366.19.  OPERATION PERFORMED:  Phacoemulsification with posterior chamber intraocular lens implantation, left eye.  SURGEON:  Bonne Dolores. Garrit Marrow, MD  ANESTHESIA:  General endotracheal anesthesia.  OPERATIVE SUMMARY:  In the preoperative area, dilating drops were placed into the left eye.  The patient was then brought into the operating room where he was placed under general anesthesia.  The eye was then prepped and draped.  Beginning with a 75 blade, a paracentesis port was made at the surgeon's 2 o'clock position.  The anterior chamber was then filled with a 1% nonpreserved lidocaine solution with epinephrine.  This was followed by Viscoat to deepen the chamber.  A small fornix-based peritomy was performed superiorly.  Next, a single iris hook was placed through the limbus superiorly.  A 2.4-mm keratome blade was then used to make a clear corneal incision over the iris hook.  A bent cystotome needle and Utrata forceps were used to create a continuous tear capsulotomy.  Hydrodissection was performed using balanced salt solution on a fine cannula.  The lens nucleus was then removed using phacoemulsification in a quadrant cracking technique.  The cortical material was then removed with irrigation and aspiration.  The capsular bag and anterior chamber were refilled with Provisc.  The wound was widened to approximately 3 mm and a posterior chamber intraocular lens was placed into the capsular bag without difficulty using  an [employer] lens injecting system.  A single 10-0 nylon suture was then used to close the incision as well as stromal hydration.  The Provisc was removed from the anterior chamber and capsular bag with irrigation and aspiration.  At this point, the wounds were tested for leak, which were negative.  The anterior chamber remained deep and stable.  The patient tolerated the procedure well.  There were no operative complications, and he awoke from general anesthesia without problem.  No surgical specimens.  Prosthetic device used is a Cabin crew posterior chamber lens, model MX60, power of 21.5.  Serial number is [id number].          ______________________________ Susanne Greenhouse, MD     KEH/MEDQ  D:  02/14/2012  T:  02/15/2012  Job:  [id number]

## 2012-02-18 ENCOUNTER — Encounter (HOSPITAL_COMMUNITY): Payer: Self-pay | Admitting: Ophthalmology

## 2012-02-25 ENCOUNTER — Encounter (HOSPITAL_COMMUNITY): Payer: Self-pay | Admitting: Pharmacy Technician

## 2012-02-25 DIAGNOSIS — H2589 Other age-related cataract: Secondary | ICD-10-CM | POA: Diagnosis not present

## 2012-02-25 DIAGNOSIS — H35379 Puckering of macula, unspecified eye: Secondary | ICD-10-CM | POA: Diagnosis not present

## 2012-02-25 DIAGNOSIS — H4011X0 Primary open-angle glaucoma, stage unspecified: Secondary | ICD-10-CM | POA: Diagnosis not present

## 2012-02-27 ENCOUNTER — Encounter (HOSPITAL_COMMUNITY)
Admission: RE | Admit: 2012-02-27 | Discharge: 2012-02-27 | Payer: Medicare Other | Source: Ambulatory Visit | Admitting: Ophthalmology

## 2012-02-29 MED ORDER — TETRACAINE HCL 0.5 % OP SOLN
OPHTHALMIC | Status: AC
  Filled 2012-02-29: qty 2

## 2012-02-29 MED ORDER — LIDOCAINE HCL (PF) 1 % IJ SOLN
INTRAMUSCULAR | Status: AC
  Filled 2012-02-29: qty 2

## 2012-02-29 MED ORDER — CYCLOPENTOLATE-PHENYLEPHRINE 0.2-1 % OP SOLN
OPHTHALMIC | Status: AC
  Filled 2012-02-29: qty 2

## 2012-02-29 MED ORDER — LIDOCAINE HCL 3.5 % OP GEL
OPHTHALMIC | Status: AC
  Filled 2012-02-29: qty 5

## 2012-02-29 MED ORDER — NEOMYCIN-POLYMYXIN-DEXAMETH 3.5-10000-0.1 OP OINT
TOPICAL_OINTMENT | OPHTHALMIC | Status: AC
  Filled 2012-02-29: qty 3.5

## 2012-03-03 ENCOUNTER — Ambulatory Visit (HOSPITAL_COMMUNITY): Payer: Medicare Other | Admitting: Anesthesiology

## 2012-03-03 ENCOUNTER — Encounter (HOSPITAL_COMMUNITY)
Admission: RE | Disposition: A | Discharge status: Home / Self Care | Payer: Self-pay | Source: Ambulatory Visit | Attending: Ophthalmology

## 2012-03-03 ENCOUNTER — Encounter (HOSPITAL_COMMUNITY): Payer: Self-pay | Admitting: Anesthesiology

## 2012-03-03 ENCOUNTER — Encounter (HOSPITAL_COMMUNITY): Payer: Self-pay

## 2012-03-03 ENCOUNTER — Ambulatory Visit (HOSPITAL_COMMUNITY)
Admission: RE | Admit: 2012-03-03 | Discharge: 2012-03-03 | Disposition: A | Discharge status: Home / Self Care | Payer: Medicare Other | Source: Ambulatory Visit | Attending: Ophthalmology | Admitting: Ophthalmology

## 2012-03-03 DIAGNOSIS — H2589 Other age-related cataract: Secondary | ICD-10-CM | POA: Diagnosis not present

## 2012-03-03 DIAGNOSIS — Z79899 Other long term (current) drug therapy: Secondary | ICD-10-CM | POA: Diagnosis not present

## 2012-03-03 DIAGNOSIS — I10 Essential (primary) hypertension: Secondary | ICD-10-CM | POA: Diagnosis not present

## 2012-03-03 DIAGNOSIS — H269 Unspecified cataract: Secondary | ICD-10-CM | POA: Diagnosis not present

## 2012-03-03 HISTORY — PX: CATARACT EXTRACTION W/PHACO: SHX586

## 2012-03-03 SURGERY — PHACOEMULSIFICATION, CATARACT, WITH IOL INSERTION
Anesthesia: Monitor Anesthesia Care | Site: Eye | Laterality: Right | Wound class: Clean

## 2012-03-03 MED ORDER — PROVISC 10 MG/ML IO SOLN
INTRAOCULAR | Status: DC | PRN
  Administered 2012-03-03: .85 mL via INTRAOCULAR

## 2012-03-03 MED ORDER — PHENYLEPHRINE HCL 2.5 % OP SOLN
1.0000 [drp] | OPHTHALMIC | Status: AC
  Administered 2012-03-03 (×3): 1 [drp] via OPHTHALMIC

## 2012-03-03 MED ORDER — PHENYLEPHRINE HCL 2.5 % OP SOLN
OPHTHALMIC | Status: AC
  Administered 2012-03-03: 1 [drp] via OPHTHALMIC
  Filled 2012-03-03: qty 2

## 2012-03-03 MED ORDER — MIDAZOLAM HCL 5 MG/5ML IJ SOLN
INTRAMUSCULAR | Status: DC | PRN
  Administered 2012-03-03: 2 mg via INTRAVENOUS

## 2012-03-03 MED ORDER — NEOMYCIN-POLYMYXIN-DEXAMETH 0.1 % OP OINT
TOPICAL_OINTMENT | OPHTHALMIC | Status: DC | PRN
  Administered 2012-03-03: 1 via OPHTHALMIC

## 2012-03-03 MED ORDER — MIDAZOLAM HCL 2 MG/2ML IJ SOLN
INTRAMUSCULAR | Status: AC
  Administered 2012-03-03: 2 mg via INTRAVENOUS
  Filled 2012-03-03: qty 2

## 2012-03-03 MED ORDER — EPINEPHRINE HCL 1 MG/ML IJ SOLN
INTRAMUSCULAR | Status: AC
  Filled 2012-03-03: qty 1

## 2012-03-03 MED ORDER — MIDAZOLAM HCL 2 MG/2ML IJ SOLN
INTRAMUSCULAR | Status: AC
  Filled 2012-03-03: qty 2

## 2012-03-03 MED ORDER — MIDAZOLAM HCL 2 MG/2ML IJ SOLN
1.0000 mg | INTRAMUSCULAR | Status: DC | PRN
  Administered 2012-03-03: 2 mg via INTRAVENOUS

## 2012-03-03 MED ORDER — POVIDONE-IODINE 5 % OP SOLN
OPHTHALMIC | Status: DC | PRN
  Administered 2012-03-03: 1 via OPHTHALMIC

## 2012-03-03 MED ORDER — ONDANSETRON HCL 4 MG/2ML IJ SOLN: 4.0000 mg | Freq: Once | INTRAMUSCULAR | Status: DC | PRN

## 2012-03-03 MED ORDER — FENTANYL CITRATE 0.05 MG/ML IJ SOLN: 25.0000 ug | INTRAMUSCULAR | Status: DC | PRN

## 2012-03-03 MED ORDER — LACTATED RINGERS IV SOLN
INTRAVENOUS | Status: DC
  Administered 2012-03-03: 10:00:00 via INTRAVENOUS

## 2012-03-03 MED ORDER — LIDOCAINE 3.5 % OP GEL OPTIME - NO CHARGE
OPHTHALMIC | Status: DC | PRN
  Administered 2012-03-03: 1 [drp] via OPHTHALMIC

## 2012-03-03 MED ORDER — EPINEPHRINE HCL 1 MG/ML IJ SOLN
INTRAOCULAR | Status: DC | PRN
  Administered 2012-03-03: 11:00:00

## 2012-03-03 MED ORDER — LIDOCAINE HCL (PF) 1 % IJ SOLN
INTRAMUSCULAR | Status: DC | PRN
  Administered 2012-03-03: .3 mL

## 2012-03-03 MED ORDER — LIDOCAINE HCL 3.5 % OP GEL
1.0000 "application " | Freq: Once | OPHTHALMIC | Status: AC
  Administered 2012-03-03: 1 via OPHTHALMIC

## 2012-03-03 MED ORDER — BSS IO SOLN
INTRAOCULAR | Status: DC | PRN
  Administered 2012-03-03: 15 mL via INTRAOCULAR

## 2012-03-03 MED ORDER — CYCLOPENTOLATE-PHENYLEPHRINE 0.2-1 % OP SOLN
1.0000 [drp] | OPHTHALMIC | Status: AC
  Administered 2012-03-03 (×3): 1 [drp] via OPHTHALMIC

## 2012-03-03 MED ORDER — TETRACAINE HCL 0.5 % OP SOLN
1.0000 [drp] | OPHTHALMIC | Status: AC
  Administered 2012-03-03 (×3): 1 [drp] via OPHTHALMIC

## 2012-03-03 SURGICAL SUPPLY — 32 items

## 2012-03-03 NOTE — Anesthesia Preprocedure Evaluation (Signed)
Anesthesia Evaluation  Patient identified by MRN, date of birth, ID band Patient awake    Reviewed: Allergy & Precautions, H&P , NPO status , Patient's Chart, lab work & pertinent test results  History of Anesthesia Complications Negative for: history of anesthetic complications  Airway Mallampati: II      Dental  (+) Teeth Intact   Pulmonary neg pulmonary ROS,  breath sounds clear to auscultation        Cardiovascular hypertension, Pt. on medications Rhythm:Regular     Neuro/Psych    GI/Hepatic GERD-  Controlled and Medicated,  Endo/Other    Renal/GU      Musculoskeletal   Abdominal   Peds  Hematology   Anesthesia Other Findings   Reproductive/Obstetrics                           Anesthesia Physical Anesthesia Plan  ASA: II  Anesthesia Plan: MAC   Post-op Pain Management:    Induction: Intravenous  Airway Management Planned: Nasal Cannula  Additional Equipment:   Intra-op Plan:   Post-operative Plan:   Informed Consent: I have reviewed the patients History and Physical, chart, labs and discussed the procedure including the risks, benefits and alternatives for the proposed anesthesia with the patient or authorized representative who has indicated his/her understanding and acceptance.     Plan Discussed with:   Anesthesia Plan Comments:         Anesthesia Quick Evaluation

## 2012-03-03 NOTE — Discharge Instructions (Signed)
Robin Lindsey  03/03/2012     Instructions  1. Use medications as Instructed.  Shake well before use. Wait 5 minutes between drops.  {OPHTHALMIC ANTIBIOTICS:22167} 4 times a day x 1 week.  {OPHTHALMIC ANTI-INFLAMMATORY:22168} 2 times a day x 4 weeks.  {OPHTHALMIC STEROID:22169} 4 times a day - week 1   3 times a day - Week 2, 2 times a day- Week 3, 1 time a day - Week 4.  2. Do not rub the operative eye. Do not swim underwater for 2 weeks.  3. You may remove the clear shield and resume your normal activities the day after  Surgery. Your eyes may feel more comfortable if you wear dark glasses outside.  4. Call our office at [phone] if you have sudden change in vision, extreme redness or pain. Some fluctuation in vision is normal after surgery. If you have an emergency after hours, call Dr. Alto Denver at [phone].  5. It is important that you attend all of your follow-up appointments.        Follow-up:{follow up:32580} with Gemma Payor, MD.   Dr. Lahoma Crocker: [phone]  Dr. Lita Mains: [phone]  Dr. Alto Denver: [phone]   If you find that you cannot contact your physician, but feel that your signs and   Symptoms warrant a physician's attention, call the Emergency Room at   [phone].   Other{NA AND KGMWNUUV:25366}.

## 2012-03-03 NOTE — Anesthesia Postprocedure Evaluation (Signed)
  Anesthesia Post-op Note  Patient: Robin Lindsey  Procedure(s) Performed: Procedure(s) (LRB): CATARACT EXTRACTION PHACO AND INTRAOCULAR LENS PLACEMENT (IOC) (Right)  Patient Location:  Short Stay  Anesthesia Type: MAC  Level of Consciousness: awake  Airway and Oxygen Therapy: Patient Spontanous Breathing  Post-op Pain: none  Post-op Assessment: Post-op Vital signs reviewed, Patient's Cardiovascular Status Stable, Respiratory Function Stable, Patent Airway, No signs of Nausea or vomiting and Pain level controlled  Post-op Vital Signs: Reviewed and stable  Complications: No apparent anesthesia complications

## 2012-03-03 NOTE — Brief Op Note (Signed)
Pre-Op Dx: Cataract OD Post-Op Dx: Cataract OD Surgeon: Jacilyn Sanpedro Anesthesia: Topical with MAC Implant: B&L enVista Specimen: None Complications: None 

## 2012-03-03 NOTE — H&P (Signed)
I have reviewed the H&P, the patient was re-examined, and I have identified no interval changes in medical condition and plan of care since the history and physical of record  

## 2012-03-03 NOTE — Anesthesia Procedure Notes (Signed)
Procedure Name: MAC Date/Time: 03/03/2012 10:32 AM Performed by: Franco Nones Pre-anesthesia Checklist: Patient identified, Emergency Drugs available, Suction available, Timeout performed and Patient being monitored Patient Re-evaluated:Patient Re-evaluated prior to inductionOxygen Delivery Method: Nasal Cannula

## 2012-03-03 NOTE — Op Note (Signed)
Robin Lindsey, SYMONDS NO.:  [id number]  MEDICAL RECORD NO.:  [id number]  LOCATION:  [facility]                          FACILITY:  [facility]  PHYSICIAN:  Susanne Greenhouse, MD       DATE OF BIRTH:  [birth year]  DATE OF PROCEDURE:  03/03/2012 DATE OF DISCHARGE:  03/03/2012                              OPERATIVE REPORT   PREOPERATIVE DIAGNOSIS:  Combined cataract, right eye.  Diagnosis code 366.19.  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  No surgical specimens.  Prosthetic device used is a [employer]ch [employer] Lomb enVista posterior chamber lens, model MX60.  Power of 22.0.  Serial number is [id number].          ______________________________ Susanne Greenhouse, MD     KEH/MEDQ  D:  03/03/2012  T:  03/03/2012  Job:  [id number]

## 2012-03-03 NOTE — Transfer of Care (Signed)
Immediate Anesthesia Transfer of Care Note  Patient: Robin Lindsey  Procedure(s) Performed: Procedure(s) (LRB): CATARACT EXTRACTION PHACO AND INTRAOCULAR LENS PLACEMENT (IOC) (Right)  Patient Location: Shortstay  Anesthesia Type: MAC  Level of Consciousness: awake  Airway & Oxygen Therapy: Patient Spontanous Breathing   Post-op Assessment: Report given to PACU RN, Post -op Vital signs reviewed and stable and Patient moving all extremities  Post vital signs: Reviewed and stable  Complications: No apparent anesthesia complications

## 2012-03-06 ENCOUNTER — Encounter (HOSPITAL_COMMUNITY): Payer: Self-pay | Admitting: Ophthalmology

## 2012-04-09 DIAGNOSIS — E785 Hyperlipidemia, unspecified: Secondary | ICD-10-CM | POA: Diagnosis not present

## 2012-04-09 DIAGNOSIS — I10 Essential (primary) hypertension: Secondary | ICD-10-CM | POA: Diagnosis not present

## 2012-04-09 DIAGNOSIS — K440 Diaphragmatic hernia with obstruction, without gangrene: Secondary | ICD-10-CM | POA: Diagnosis not present

## 2012-04-18 DIAGNOSIS — Z961 Presence of intraocular lens: Secondary | ICD-10-CM | POA: Diagnosis not present

## 2012-04-18 DIAGNOSIS — H5200 Hypermetropia, unspecified eye: Secondary | ICD-10-CM | POA: Diagnosis not present

## 2012-04-18 DIAGNOSIS — H52229 Regular astigmatism, unspecified eye: Secondary | ICD-10-CM | POA: Diagnosis not present

## 2012-04-18 DIAGNOSIS — H4011X0 Primary open-angle glaucoma, stage unspecified: Secondary | ICD-10-CM | POA: Diagnosis not present

## 2012-05-20 DIAGNOSIS — Z09 Encounter for follow-up examination after completed treatment for conditions other than malignant neoplasm: Secondary | ICD-10-CM | POA: Diagnosis not present

## 2012-05-20 DIAGNOSIS — N63 Unspecified lump in unspecified breast: Secondary | ICD-10-CM | POA: Diagnosis not present

## 2012-06-02 DIAGNOSIS — H1010 Acute atopic conjunctivitis, unspecified eye: Secondary | ICD-10-CM | POA: Diagnosis not present

## 2012-06-02 DIAGNOSIS — H1045 Other chronic allergic conjunctivitis: Secondary | ICD-10-CM | POA: Diagnosis not present

## 2012-06-02 DIAGNOSIS — H10439 Chronic follicular conjunctivitis, unspecified eye: Secondary | ICD-10-CM | POA: Diagnosis not present

## 2012-06-16 DIAGNOSIS — H1010 Acute atopic conjunctivitis, unspecified eye: Secondary | ICD-10-CM | POA: Diagnosis not present

## 2012-06-23 DIAGNOSIS — H40019 Open angle with borderline findings, low risk, unspecified eye: Secondary | ICD-10-CM | POA: Diagnosis not present

## 2012-07-21 DIAGNOSIS — H4011X0 Primary open-angle glaucoma, stage unspecified: Secondary | ICD-10-CM | POA: Diagnosis not present

## 2012-07-25 ENCOUNTER — Other Ambulatory Visit: Payer: Self-pay

## 2012-07-25 DIAGNOSIS — Z124 Encounter for screening for malignant neoplasm of cervix: Secondary | ICD-10-CM | POA: Diagnosis not present

## 2012-08-21 DIAGNOSIS — H4011X0 Primary open-angle glaucoma, stage unspecified: Secondary | ICD-10-CM | POA: Diagnosis not present

## 2012-10-24 DIAGNOSIS — H4011X0 Primary open-angle glaucoma, stage unspecified: Secondary | ICD-10-CM | POA: Diagnosis not present

## 2012-10-24 DIAGNOSIS — Z961 Presence of intraocular lens: Secondary | ICD-10-CM | POA: Diagnosis not present

## 2012-10-31 ENCOUNTER — Encounter: Payer: Self-pay | Admitting: Internal Medicine

## 2012-11-17 DIAGNOSIS — Z09 Encounter for follow-up examination after completed treatment for conditions other than malignant neoplasm: Secondary | ICD-10-CM | POA: Diagnosis not present

## 2012-11-17 DIAGNOSIS — N6489 Other specified disorders of breast: Secondary | ICD-10-CM | POA: Diagnosis not present

## 2012-11-25 ENCOUNTER — Ambulatory Visit (AMBULATORY_SURGERY_CENTER): Payer: Medicare Other | Admitting: *Deleted

## 2012-11-25 ENCOUNTER — Telehealth: Payer: Self-pay | Admitting: *Deleted

## 2012-11-25 VITALS — Ht 61.0 in | Wt 200.0 lb

## 2012-11-25 DIAGNOSIS — Z1211 Encounter for screening for malignant neoplasm of colon: Secondary | ICD-10-CM

## 2012-11-25 MED ORDER — SUPREP BOWEL PREP KIT 17.5-3.13-1.6 GM/177ML PO SOLN: ORAL | Status: DC

## 2012-11-25 NOTE — Telephone Encounter (Signed)
Ms Huffstetler had a colon around 2010 that was done by Dr. Linna Darner at [facility].  I gave the release of medical information to Patti Swaziland CMA

## 2012-11-26 NOTE — Telephone Encounter (Signed)
I faxed ROI today and hopefully we will get reports soon from [facility] where the Dr. Works out of.

## 2012-12-09 ENCOUNTER — Encounter: Payer: Self-pay | Admitting: Internal Medicine

## 2012-12-09 ENCOUNTER — Ambulatory Visit (AMBULATORY_SURGERY_CENTER): Payer: Medicare Other | Admitting: Internal Medicine

## 2012-12-09 VITALS — BP 133/63 | HR 72 | Temp 97.7°F | Resp 14 | Ht 61.0 in | Wt 200.0 lb

## 2012-12-09 DIAGNOSIS — Z860100 Personal history of colon polyps, unspecified: Secondary | ICD-10-CM

## 2012-12-09 DIAGNOSIS — Z1211 Encounter for screening for malignant neoplasm of colon: Secondary | ICD-10-CM | POA: Diagnosis not present

## 2012-12-09 DIAGNOSIS — D126 Benign neoplasm of colon, unspecified: Secondary | ICD-10-CM | POA: Diagnosis not present

## 2012-12-09 DIAGNOSIS — I10 Essential (primary) hypertension: Secondary | ICD-10-CM | POA: Diagnosis not present

## 2012-12-09 HISTORY — DX: Personal history of colon polyps, unspecified: Z86.0100

## 2012-12-09 HISTORY — DX: Personal history of colonic polyps: Z86.010

## 2012-12-09 MED ORDER — SODIUM CHLORIDE 0.9 % IV SOLN: 500.0000 mL | INTRAVENOUS | Status: DC

## 2012-12-09 NOTE — Progress Notes (Signed)
Patient did not experience any of the following events: a burn prior to discharge; a fall within the facility; wrong site/side/patient/procedure/implant event; or a hospital transfer or hospital admission upon discharge from the facility. (G8907) Patient did not have preoperative order for IV antibiotic SSI prophylaxis. (G8918)  

## 2012-12-09 NOTE — Op Note (Signed)
[facility] [address] [place], [address]   COLONOSCOPY PROCEDURE REPORT  PATIENT: Robin, Lindsey  MR#: [id number] BIRTHDATE: 1946/05/29 , 66  yrs. old GENDER: Female ENDOSCOPIST: Iva Boop, MD, [facility] PROCEDURE DATE:  12/09/2012 PROCEDURE:   Colonoscopy with snare polypectomy ASA CLASS:   Class II INDICATIONS:Screening and surveillance,personal history of colonic polyps. MEDICATIONS: propofol (Diprivan) 350mg  IV, MAC sedation, administered by CRNA, and These medications were titrated to patient response per physician's verbal order  DESCRIPTION OF PROCEDURE:   After the risks benefits and alternatives of the procedure were thoroughly explained, informed consent was obtained.  A digital rectal exam revealed hemorrhoids. The LB [id number]  endoscope was introduced through the anus and advanced to the cecum, which was identified by both the appendix and ileocecal valve. No adverse events experienced.   The quality of the prep was Suprep excellent  The instrument was then slowly withdrawn as the colon was fully examined.      COLON FINDINGS: Three polypoid shaped sessile polyps ranging between 3-13mm in size were found at the cecum and in the ascending colon. A polypectomy was performed with a cold snare.  The resection was complete and the polyp tissue was completely retrieved.   Small internal hemorrhoids were found.   The colon mucosa was otherwise normal.  Retroflexed views revealed internal hemorrhoids. The time to cecum=1 minutes 34 seconds.  Withdrawal time=17 minutes 28 seconds.  The scope was withdrawn and the procedure completed. COMPLICATIONS: There were no complications.  ENDOSCOPIC IMPRESSION: 1.   Three sessile polyps ranging between 3-53mm in size were found at the cecum and in the ascending colon; polypectomy was performed with a cold snare 2.   Small internal hemorrhoids 3.   The colon mucosa was otherwise normal - in patient  with prior tv adenoma (2003) and serrated adenoma (2007)  RECOMMENDATIONS: Timing of repeat colonoscopy will be determined by pathology findings.   eSigned:  Iva Boop, MD, [facility] 12/09/2012 12:43 PM  cc: Rudi Heap, MD and The Patient

## 2012-12-09 NOTE — Patient Instructions (Addendum)
Three small polyps were removed today. I will let you know the pathology results and when to have another colonoscopy by mail.  Please soak in the tub and use Recticare for the hemorrhoids.  Thank you for choosing me and [facility].  Iva Boop, MD, FACG  YOU HAD AN ENDOSCOPIC PROCEDURE TODAY AT THE [facility]: Refer to the procedure report that was given to you for any specific questions about what was found during the examination.  If the procedure report does not answer your questions, please call your gastroenterologist to clarify.  If you requested that your care partner not be given the details of your procedure findings, then the procedure report has been included in a sealed envelope for you to review at your convenience later.  YOU SHOULD EXPECT: Some feelings of bloating in the abdomen. Passage of more gas than usual.  Walking can help get rid of the air that was put into your GI tract during the procedure and reduce the bloating. If you had a lower endoscopy (such as a colonoscopy or flexible sigmoidoscopy) you may notice spotting of blood in your stool or on the toilet paper. If you underwent a bowel prep for your procedure, then you may not have a normal bowel movement for a few days.  DIET: Your first meal following the procedure should be a light meal and then it is ok to progress to your normal diet.  A half-sandwich or bowl of soup is an example of a good first meal.  Heavy or fried foods are harder to digest and may make you feel nauseous or bloated.  Likewise meals heavy in dairy and vegetables can cause extra gas to form and this can also increase the bloating.  Drink plenty of fluids but you should avoid alcoholic beverages for 24 hours.  ACTIVITY: Your care partner should take you home directly after the procedure.  You should plan to take it easy, moving slowly for the rest of the day.  You can resume normal activity the day after the procedure  however you should NOT DRIVE or use heavy machinery for 24 hours (because of the sedation medicines used during the test).    SYMPTOMS TO REPORT IMMEDIATELY: A gastroenterologist can be reached at any hour.  During normal business hours, 8:30 AM to 5:00 PM Monday through Friday, call [phone].  After hours and on weekends, please call the GI answering service at [phone] who will take a message and have the physician on call contact you.   Following lower endoscopy (colonoscopy or flexible sigmoidoscopy):  Excessive amounts of blood in the stool  Significant tenderness or worsening of abdominal pains  Swelling of the abdomen that is new, acute  Fever of 100F or higher  FOLLOW UP: If any biopsies were taken you will be contacted by phone or by letter within the next 1-3 weeks.  Call your gastroenterologist if you have not heard about the biopsies in 3 weeks.  Our staff will call the home number listed on your records the next business day following your procedure to check on you and address any questions or concerns that you may have at that time regarding the information given to you following your procedure. This is a courtesy call and so if there is no answer at the home number and we have not heard from you through the emergency physician on call, we will assume that you have returned to your regular daily activities without incident.  SIGNATURES/CONFIDENTIALITY: You and/or your care partner have signed paperwork which will be entered into your electronic medical record.  These signatures attest to the fact that that the information above on your After Visit Summary has been reviewed and is understood.  Full responsibility of the confidentiality of this discharge information lies with you and/or your care-partner.

## 2012-12-11 ENCOUNTER — Telehealth: Payer: Self-pay

## 2012-12-11 NOTE — Telephone Encounter (Signed)
  Follow up Call-  Call back number 12/09/2012  Post procedure Call Back phone  # [phone]  Permission to leave phone message Yes     Patient questions:  Do you have a fever, pain , or abdominal swelling? no Pain Score  0 *  Have you tolerated food without any problems? yes  Have you been able to return to your normal activities? yes  Do you have any questions about your discharge instructions: Diet   no Medications  no Follow up visit  no  Do you have questions or concerns about your Care? no  Actions: * If pain score is 4 or above: No action needed, pain <4.  Per the pt, "everything was fine". Maw

## 2012-12-17 ENCOUNTER — Encounter: Payer: Self-pay | Admitting: Internal Medicine

## 2012-12-17 NOTE — Progress Notes (Signed)
Quick Note:  3 diminutive adenomas Repeat colonoscopy about 12/2015 ______

## 2012-12-18 DIAGNOSIS — Z23 Encounter for immunization: Secondary | ICD-10-CM | POA: Diagnosis not present

## 2013-01-26 DIAGNOSIS — H35379 Puckering of macula, unspecified eye: Secondary | ICD-10-CM | POA: Diagnosis not present

## 2013-01-26 DIAGNOSIS — H26499 Other secondary cataract, unspecified eye: Secondary | ICD-10-CM | POA: Diagnosis not present

## 2013-01-26 DIAGNOSIS — H353190 Nonexudative age-related macular degeneration, unspecified eye, stage unspecified: Secondary | ICD-10-CM | POA: Diagnosis not present

## 2013-01-26 DIAGNOSIS — Z961 Presence of intraocular lens: Secondary | ICD-10-CM | POA: Diagnosis not present

## 2013-03-12 DIAGNOSIS — H26499 Other secondary cataract, unspecified eye: Secondary | ICD-10-CM | POA: Diagnosis not present

## 2013-03-12 DIAGNOSIS — H35059 Retinal neovascularization, unspecified, unspecified eye: Secondary | ICD-10-CM | POA: Diagnosis not present

## 2013-03-16 ENCOUNTER — Other Ambulatory Visit: Payer: Self-pay | Admitting: *Deleted

## 2013-03-16 MED ORDER — VALSARTAN-HYDROCHLOROTHIAZIDE 320-25 MG PO TABS: 0.5000 | ORAL_TABLET | Freq: Every day | ORAL | Status: DC

## 2013-03-17 DIAGNOSIS — Z961 Presence of intraocular lens: Secondary | ICD-10-CM | POA: Diagnosis not present

## 2013-03-17 DIAGNOSIS — H26499 Other secondary cataract, unspecified eye: Secondary | ICD-10-CM | POA: Diagnosis not present

## 2013-03-17 DIAGNOSIS — H4011X0 Primary open-angle glaucoma, stage unspecified: Secondary | ICD-10-CM | POA: Diagnosis not present

## 2013-03-21 DIAGNOSIS — H26499 Other secondary cataract, unspecified eye: Secondary | ICD-10-CM | POA: Diagnosis not present

## 2013-04-15 DIAGNOSIS — H4011X0 Primary open-angle glaucoma, stage unspecified: Secondary | ICD-10-CM | POA: Diagnosis not present

## 2013-04-23 ENCOUNTER — Encounter: Payer: Self-pay | Admitting: Physician Assistant

## 2013-04-23 ENCOUNTER — Ambulatory Visit (INDEPENDENT_AMBULATORY_CARE_PROVIDER_SITE_OTHER): Payer: Medicare Other | Admitting: Physician Assistant

## 2013-04-23 VITALS — BP 143/70 | HR 73 | Temp 98.0°F | Ht 59.25 in | Wt 206.0 lb

## 2013-04-23 DIAGNOSIS — K219 Gastro-esophageal reflux disease without esophagitis: Secondary | ICD-10-CM | POA: Diagnosis not present

## 2013-04-23 DIAGNOSIS — M6530 Trigger finger, unspecified finger: Secondary | ICD-10-CM | POA: Diagnosis not present

## 2013-04-23 DIAGNOSIS — M25579 Pain in unspecified ankle and joints of unspecified foot: Secondary | ICD-10-CM

## 2013-04-23 DIAGNOSIS — I10 Essential (primary) hypertension: Secondary | ICD-10-CM | POA: Diagnosis not present

## 2013-04-23 LAB — BASIC METABOLIC PANEL WITH GFR
BUN: 13 mg/dL (ref 6–23)
CO2: 26 mEq/L (ref 19–32)
Chloride: 104 mEq/L (ref 96–112)
Creat: 0.92 mg/dL (ref 0.50–1.10)
Glucose, Bld: 115 mg/dL — ABNORMAL HIGH (ref 70–99)

## 2013-04-23 LAB — THYROID PANEL WITH TSH
T3 Uptake: 30.4 % (ref 22.5–37.0)
T4, Total: 8.6 ug/dL (ref 5.0–12.5)

## 2013-04-23 LAB — LIPID PANEL
Cholesterol: 183 mg/dL (ref 0–200)
LDL Cholesterol: 108 mg/dL — ABNORMAL HIGH (ref 0–99)
Total CHOL/HDL Ratio: 3.2 Ratio
VLDL: 18 mg/dL (ref 0–40)

## 2013-04-23 MED ORDER — VALSARTAN-HYDROCHLOROTHIAZIDE 160-12.5 MG PO TABS: 1.0000 | ORAL_TABLET | Freq: Every day | ORAL | Status: DC

## 2013-04-23 MED ORDER — OMEPRAZOLE 20 MG PO CPDR: 20.0000 mg | DELAYED_RELEASE_CAPSULE | Freq: Every day | ORAL | Status: DC

## 2013-04-23 MED ORDER — MELOXICAM 15 MG PO TABS: 15.0000 mg | ORAL_TABLET | Freq: Every day | ORAL | Status: DC

## 2013-04-23 NOTE — Patient Instructions (Signed)
Hypertension As your heart beats, it forces blood through your arteries. This force is your blood pressure. If the pressure is too high, it is called hypertension (HTN) or high blood pressure. HTN is dangerous because you may have it and not know it. High blood pressure may mean that your heart has to work harder to pump blood. Your arteries may be narrow or stiff. The extra work puts you at risk for heart disease, stroke, and other problems.  Blood pressure consists of two numbers, a higher number over a lower, 110/72, for example. It is stated as "110 over 72." The ideal is below 120 for the top number (systolic) and under 80 for the bottom (diastolic). Write down your blood pressure today. You should pay close attention to your blood pressure if you have certain conditions such as:  Heart failure.  Prior heart attack.  Diabetes  Chronic kidney disease.  Prior stroke.  Multiple risk factors for heart disease. To see if you have HTN, your blood pressure should be measured while you are seated with your arm held at the level of the heart. It should be measured at least twice. A one-time elevated blood pressure reading (especially in the Emergency Department) does not mean that you need treatment. There may be conditions in which the blood pressure is different between your right and left arms. It is important to see your caregiver soon for a recheck. Most people have essential hypertension which means that there is not a specific cause. This type of high blood pressure may be lowered by changing lifestyle factors such as:  Stress.  Smoking.  Lack of exercise.  Excessive weight.  Drug/tobacco/alcohol use.  Eating less salt. Most people do not have symptoms from high blood pressure until it has caused damage to the body. Effective treatment can often prevent, delay or reduce that damage. TREATMENT  When a cause has been identified, treatment for high blood pressure is directed at the  cause. There are a large number of medications to treat HTN. These fall into several categories, and your caregiver will help you select the medicines that are best for you. Medications may have side effects. You should review side effects with your caregiver. If your blood pressure stays high after you have made lifestyle changes or started on medicines,   Your medication(s) may need to be changed.  Other problems may need to be addressed.  Be certain you understand your prescriptions, and know how and when to take your medicine.  Be sure to follow up with your caregiver within the time frame advised (usually within two weeks) to have your blood pressure rechecked and to review your medications.  If you are taking more than one medicine to lower your blood pressure, make sure you know how and at what times they should be taken. Taking two medicines at the same time can result in blood pressure that is too low. SEEK IMMEDIATE MEDICAL CARE IF:  You develop a severe headache, blurred or changing vision, or confusion.  You have unusual weakness or numbness, or a faint feeling.  You have severe chest or abdominal pain, vomiting, or breathing problems. MAKE SURE YOU:   Understand these instructions.  Will watch your condition.  Will get help right away if you are not doing well or get worse. Document Released: 11/26/2005 Document Revised: 02/18/2012 Document Reviewed: 07/16/2008 [facility] Patient Information 2013 [place], [place]. Trigger Finger Trigger finger (digital tendinitis and stenosing tenosynovitis) is a common disorder that causes an  often painful catching of the fingers or thumb. It occurs as a clicking, snapping or locking of a finger in the palm of the hand. The reason for this is that there is a problem with the tendons which flex the fingers sliding smoothly through their sheaths. The cause of this may be inflammation of the tendon and sheath, or from a thickening or nodule in  the tendon. The condition may occur in any finger or a couple fingers at the same time. The cause may be overuse while doing the same activity over and over again with your hands.  Tendons are the tough cords that connect the muscles to bones. Muscles and tendons are part of the system which allows your body to move. When muscles contract in the forearm on the palm side, they pull the tendons toward the elbow and cause the fingers and thumb to bend (flex) toward the palm. These are the flexor tendons. The tendons slide through a slippery smooth membrane (synovium) which is called the tendon sheath. The sheaths have areas of tough fibrous tissues surrounding them which hold the tendons close to the bone. These are called pulleys because they work like a pulley. The first pulley is in the palm of the hand near the crease which runs across your palm. If the area of the tendon thickening is near the pulley, the tendon cannot slide smoothly through the pulley and this causes the trigger finger. The finger may lock with the finger curled or suddenly straighten out with a snap. This is more common in patients with rheumatoid arthritis and diabetes. Left untreated, the condition may get worse to the point where the finger becomes locked in flexion, like making a fist, or less commonly locked with the finger straightened out. DIAGNOSIS  Your caregiver will easily make this diagnosis on examination. TREATMENT   Splinting for 6 to 8 weeks of time may be helpful. Use the splints as your caregiver suggests.  Heat used for twenty minutes at least four times a day followed by ice packs for twenty minutes unless directed otherwise by your caregiver may be helpful. If you find either heat or cold seems to be making the problem worse, quit using them and ask your caregiver for directions.  Cortisone injections along with splinting may speed up recovery. Several injections may be required. Cortisone may give relief after  one injection.  Only take over-the-counter or prescription medicines for pain, discomfort, or fever as directed by your caregiver.  Surgery is another treatment that may be used if conservative treatments using injection and splinting does not work. Surgery can be minor without incisions (a cut does not have to be made) and can be done with a needle through the skin. No stitches are needed and most patients may return to work the same day.  Other surgical choices involve an open procedure where the surgeon opens the hand through a small incision (cut) and cuts the pulley so the tendon can again slide smoothly. Your hand will still work fine. This small operation requires stitches and the recovery will be a little longer and the incisions will need to be protected until completely healed. You may have to limit your activities for up to 6 months.  Occupational or hand therapy may be required if there is stiffness remaining in the finger. RISKS AND COMPLICATIONS Complications are uncommon but some problems that may occur are:  Recurrence of the trigger finger. This does not mean that the surgery was not well  done. It simply means that you may have formed scar tissue following surgery that causes the problem to reoccur.  Infection which could ruin the results of the surgery and can result in a finger which is frozen and can not move normally.  Nerve injury is possible which could result in permanent numbness of one or more fingers. CARE AFTER SURGERY  Elevate your hand above your heart and use ice as instructed.  Follow instructions regarding finger motion/exercise.  Keep the surgical wound dry for at least 48 hrs or longer if instructed.  Keep your follow-up appointments.  Return to work and normal activities as instructed. SEEK IMMEDIATE MEDICAL CARE IF:  Your problems are getting worse or you do not obtain relief from the treatment. Document Released: 09/15/2004 Document Revised:  02/18/2012 Document Reviewed: 05/10/2009 [facility] Patient Information 2013 [place], [place].

## 2013-04-23 NOTE — Progress Notes (Signed)
Subjective:     Patient ID: Robin Lindsey, female   DOB: [birth year], 67 y.o.   MRN: [id number]  Hypertension This is a chronic problem. The current episode started more than 1 year ago. The problem has been gradually improving since onset. The problem is controlled. There are no associated agents to hypertension. Risk factors for coronary artery disease include dyslipidemia, sedentary lifestyle and obesity. Past treatments include ACE inhibitors and diuretics. The current treatment provides moderate improvement. Compliance problems include diet and exercise.   Gastrophageal Reflux She complains of abdominal pain and belching. This is a chronic problem. The current episode started more than 1 year ago. The problem has been resolved. The symptoms are aggravated by certain foods, medications and stress. Risk factors include lack of exercise and obesity. She has tried a PPI for the symptoms. The treatment provided significant relief.     Review of Systems  Gastrointestinal: Positive for abdominal pain.  All other systems reviewed and are negative.       Objective:   Physical Exam  Vitals reviewed. Cardiovascular: Normal rate, regular rhythm, normal heart sounds and intact distal pulses.   Pulmonary/Chest: Effort normal and breath sounds normal.  No JVD/Bruits Trace lower ext edema R thumb- no edema/ecchy + triggering God pulses/sensory     Assessment:     1. HTN (hypertension)   2. GERD (gastroesophageal reflux disease)   3. Pain in joint, ankle and foot, unspecified laterality   4. Trigger finger, acquired        Plan:     Rf of meds today Pt wanted to try course of Mobic Discussed possibility of GI upset Will try 1/2 tab prn Pt up to date with mammo and colonoscopy Will inform of lab results

## 2013-05-14 DIAGNOSIS — L723 Sebaceous cyst: Secondary | ICD-10-CM | POA: Diagnosis not present

## 2013-05-14 DIAGNOSIS — L820 Inflamed seborrheic keratosis: Secondary | ICD-10-CM | POA: Diagnosis not present

## 2013-05-14 DIAGNOSIS — L819 Disorder of pigmentation, unspecified: Secondary | ICD-10-CM | POA: Diagnosis not present

## 2013-05-14 DIAGNOSIS — L570 Actinic keratosis: Secondary | ICD-10-CM | POA: Diagnosis not present

## 2013-07-15 ENCOUNTER — Other Ambulatory Visit: Payer: Self-pay

## 2013-07-16 DIAGNOSIS — H4011X0 Primary open-angle glaucoma, stage unspecified: Secondary | ICD-10-CM | POA: Diagnosis not present

## 2013-07-16 DIAGNOSIS — Z961 Presence of intraocular lens: Secondary | ICD-10-CM | POA: Diagnosis not present

## 2013-08-07 ENCOUNTER — Encounter: Payer: Self-pay | Admitting: General Practice

## 2013-08-07 ENCOUNTER — Ambulatory Visit (INDEPENDENT_AMBULATORY_CARE_PROVIDER_SITE_OTHER): Payer: Medicare Other | Admitting: General Practice

## 2013-08-07 VITALS — BP 129/70 | HR 74 | Temp 97.0°F | Ht 59.0 in | Wt 215.0 lb

## 2013-08-07 DIAGNOSIS — N63 Unspecified lump in unspecified breast: Secondary | ICD-10-CM

## 2013-08-07 DIAGNOSIS — N6320 Unspecified lump in the left breast, unspecified quadrant: Secondary | ICD-10-CM

## 2013-08-07 NOTE — Progress Notes (Signed)
  Subjective:    Patient ID: Robin Lindsey, female    DOB: 1946/09/11, 67 y.o.   MRN: [id number]  HPI Patient presents today with complaints of pain in left breast. She reports having a mass that has been under observation for the past year and yearly mammograms recommended. She reports onset of pain was one week ago. Denies drainage or discharge from nipple. Would like referral.     Review of Systems  Constitutional: Negative for fever and chills.  Respiratory: Negative for chest tightness and shortness of breath.   Cardiovascular: Negative for chest pain.  Neurological: Negative for dizziness, weakness and headaches.       Objective:   Physical Exam  Constitutional: She is oriented to person, place, and time. She appears well-developed and well-nourished.  Cardiovascular: Normal rate, regular rhythm and normal heart sounds.   Pulmonary/Chest: Effort normal and breath sounds normal. No respiratory distress. She exhibits no tenderness. Left breast exhibits no inverted nipple and no nipple discharge. Breasts are symmetrical.  Unable to palpate mass in left breast  Neurological: She is alert and oriented to person, place, and time.  Skin: Skin is warm and dry.  Psychiatric: She has a normal mood and affect.          Assessment & Plan:  1. Left breast mass - MM Digital Diagnostic Unilat L; Future -RTO if symptoms worsen, prior to scheduled mammogram visit -Patient verbalized understanding -Coralie Keens, FNP-C

## 2013-08-13 DIAGNOSIS — N63 Unspecified lump in unspecified breast: Secondary | ICD-10-CM | POA: Diagnosis not present

## 2013-08-31 ENCOUNTER — Ambulatory Visit (INDEPENDENT_AMBULATORY_CARE_PROVIDER_SITE_OTHER): Payer: Medicare Other | Admitting: Family Medicine

## 2013-08-31 ENCOUNTER — Telehealth: Payer: Self-pay | Admitting: General Practice

## 2013-08-31 ENCOUNTER — Encounter: Payer: Self-pay | Admitting: Family Medicine

## 2013-08-31 VITALS — BP 132/71 | HR 85 | Temp 99.0°F | Ht 59.25 in | Wt 207.4 lb

## 2013-08-31 DIAGNOSIS — J069 Acute upper respiratory infection, unspecified: Secondary | ICD-10-CM | POA: Diagnosis not present

## 2013-08-31 MED ORDER — AZITHROMYCIN 250 MG PO TABS: ORAL_TABLET | ORAL | Status: DC

## 2013-08-31 NOTE — Patient Instructions (Signed)

## 2013-08-31 NOTE — Progress Notes (Signed)
  Subjective:    Patient ID: Tommy Rainwater, female    DOB: 1946-04-16, 67 y.o.   MRN: [id number]  HPI This 67 y.o. female presents for evaluation of URI sx's for over a week.  She is having Some fever and productive cough.  She is feeling washed out and tired.   Review of Systems    No chest pain, SOB, HA, dizziness, vision change, N/V, diarrhea, constipation, dysuria, urinary urgency or frequency, myalgias, arthralgias or rash.  Objective:   Physical Exam  Vital signs noted  Well developed well nourished female.  HEENT - Head atraumatic Normocephalic                Eyes - PERRLA, Conjuctiva - clear Sclera- Clear EOMI                Ears - EAC's Wnl TM's Wnl Gross Hearing WNL                Nose - Nares patent                 Throat - oropharanx wnl Respiratory - Lungs CTA bilateral Cardiac - RRR S1 and S2 without murmur Neuro - Grossly intact.      Assessment & Plan:  URI (upper respiratory infection) - Plan: azithromycin (ZITHROMAX) 250 MG tablet Push po fluids, rest, tylenol and motrin otc prn as directed.  Follow up prn if sx's persist or continue.

## 2013-08-31 NOTE — Telephone Encounter (Signed)
Appt given for today 

## 2013-10-06 ENCOUNTER — Ambulatory Visit (INDEPENDENT_AMBULATORY_CARE_PROVIDER_SITE_OTHER): Payer: Medicare Other

## 2013-10-06 DIAGNOSIS — Z23 Encounter for immunization: Secondary | ICD-10-CM | POA: Diagnosis not present

## 2013-11-17 DIAGNOSIS — Z961 Presence of intraocular lens: Secondary | ICD-10-CM | POA: Diagnosis not present

## 2013-11-17 DIAGNOSIS — H4011X0 Primary open-angle glaucoma, stage unspecified: Secondary | ICD-10-CM | POA: Diagnosis not present

## 2013-11-19 DIAGNOSIS — N63 Unspecified lump in unspecified breast: Secondary | ICD-10-CM | POA: Diagnosis not present

## 2014-03-08 ENCOUNTER — Other Ambulatory Visit: Payer: Self-pay | Admitting: Physician Assistant

## 2014-03-09 NOTE — Telephone Encounter (Signed)
Last ov 9/14 

## 2014-03-16 DIAGNOSIS — Z961 Presence of intraocular lens: Secondary | ICD-10-CM | POA: Diagnosis not present

## 2014-03-16 DIAGNOSIS — H4011X0 Primary open-angle glaucoma, stage unspecified: Secondary | ICD-10-CM | POA: Diagnosis not present

## 2014-03-16 DIAGNOSIS — H26499 Other secondary cataract, unspecified eye: Secondary | ICD-10-CM | POA: Diagnosis not present

## 2014-04-08 ENCOUNTER — Other Ambulatory Visit: Payer: Self-pay | Admitting: Physician Assistant

## 2014-04-21 ENCOUNTER — Ambulatory Visit (INDEPENDENT_AMBULATORY_CARE_PROVIDER_SITE_OTHER): Payer: Medicare Other

## 2014-04-21 ENCOUNTER — Ambulatory Visit (INDEPENDENT_AMBULATORY_CARE_PROVIDER_SITE_OTHER): Payer: Medicare Other | Admitting: Physician Assistant

## 2014-04-21 ENCOUNTER — Encounter: Payer: Self-pay | Admitting: Physician Assistant

## 2014-04-21 VITALS — BP 139/70 | HR 76 | Temp 98.2°F | Ht 59.25 in | Wt 209.0 lb

## 2014-04-21 DIAGNOSIS — Z2911 Encounter for prophylactic immunotherapy for respiratory syncytial virus (RSV): Secondary | ICD-10-CM

## 2014-04-21 DIAGNOSIS — Z23 Encounter for immunization: Secondary | ICD-10-CM | POA: Diagnosis not present

## 2014-04-21 DIAGNOSIS — K219 Gastro-esophageal reflux disease without esophagitis: Secondary | ICD-10-CM

## 2014-04-21 DIAGNOSIS — R0602 Shortness of breath: Secondary | ICD-10-CM

## 2014-04-21 DIAGNOSIS — I10 Essential (primary) hypertension: Secondary | ICD-10-CM

## 2014-04-21 LAB — POCT CBC
Granulocyte percent: 61.7 %G (ref 37–80)
HCT, POC: 41.3 % (ref 37.7–47.9)
Hemoglobin: 13.4 g/dL (ref 12.2–16.2)
LYMPH, POC: 2.3 (ref 0.6–3.4)
MCH: 29.5 pg (ref 27–31.2)
MCHC: 32.5 g/dL (ref 31.8–35.4)
MCV: 90.8 fL (ref 80–97)
MPV: 8.9 fL (ref 0–99.8)
PLATELET COUNT, POC: 235 10*3/uL (ref 142–424)
POC Granulocyte: 4.3 (ref 2–6.9)
POC LYMPH %: 33.1 % (ref 10–50)
RBC: 4.6 M/uL (ref 4.04–5.48)
RDW, POC: 13.7 %
WBC: 7 10*3/uL (ref 4.6–10.2)

## 2014-04-21 MED ORDER — OMEPRAZOLE 20 MG PO CPDR: 20.0000 mg | DELAYED_RELEASE_CAPSULE | Freq: Every day | ORAL | Status: DC

## 2014-04-21 MED ORDER — VALSARTAN-HYDROCHLOROTHIAZIDE 160-12.5 MG PO TABS: 1.0000 | ORAL_TABLET | Freq: Every day | ORAL | Status: DC

## 2014-04-21 NOTE — Progress Notes (Signed)
   Subjective:    Patient ID: Robin Lindsey, female    DOB: [birth year], 68 y.o.   MRN: [id number]  HPI Pt here for recheck of HTN and reflux She continues to take the Prilosec daily She has tried stopping but has return of sx Pt has cont to watch intake of dairy and raw vegetables due to sx She denies any CP but has had some SOB with minimal activities - walking up hills and steps This has been progressive over the last 5 months No change in her trace lower ext edema by the end of the day She also denies any sig. weight gain Pt with prev treadmill that was negative When she has checked her BP it has been normal Denies any cramping to the legs/calves with sx + Hx of smoking but quit 40 yrs ago    Review of Systems     Objective:   Physical Exam NAD sitting comfortably No JVD/Bruits Heart- RRR w/o murmur laying and sitting Lungs- CTA bilat with no wheezes Abd- Obese, soft, NT/ND, no masses/HSM Pulses equal in the upper ext Lower ext with trace edema bilat EKG done today- NSR  No ST elevation  BMP/CBC/Lipid panel done today CXR- done today        Assessment & Plan:  A- HTN GERD Lower ext edema New onset of progressive dyspnea  P- Refer to Cardiology for stress testing/eval Will inform of all lab results Cont current meds -  rf done for 6 months Keep f/u appt with Opth  F/U in 6 months

## 2014-04-21 NOTE — Addendum Note (Signed)
Addended by: Ilean China on: 04/21/2014 10:40 AM   Modules accepted: Orders

## 2014-04-21 NOTE — Patient Instructions (Addendum)
Shortness of Breath Shortness of breath means you have trouble breathing. Shortness of breath may indicate that you have a medical problem. You should seek immediate medical care for shortness of breath. CAUSES   Not enough oxygen in the air (as with high altitudes or a smoke-filled room).  Short-term (acute) lung disease, including:  Infections, such as pneumonia.  Fluid in the lungs, such as heart failure.  A blood clot in the lungs (pulmonary embolism).  Long-term (chronic) lung diseases.  Heart disease (heart attack, angina, heart failure, and others).  Low red blood cells (anemia).  Poor physical fitness. This can cause shortness of breath when you exercise.  Chest or back injuries or stiffness.  Being overweight.  Smoking.  Anxiety. This can make you feel like you are not getting enough air. DIAGNOSIS  Serious medical problems can usually be found during your physical exam. Tests may also be done to determine why you are having shortness of breath. Tests may include:  Chest X-rays.  Lung function tests.  Blood tests.  Electrocardiography.  Exercise testing.  Echocardiography.  Imaging scans. Your caregiver may not be able to find a cause for your shortness of breath after your exam. In this case, it is important to have a follow-up exam with your caregiver as directed.  TREATMENT  Treatment for shortness of breath depends on the cause of your symptoms and can vary greatly. HOME CARE INSTRUCTIONS   Do not smoke. Smoking is a common cause of shortness of breath. If you smoke, ask for help to quit.  Avoid being around chemicals or things that may bother your breathing, such as paint fumes and dust.  Rest as needed. Slowly resume your usual activities.  If medicines were prescribed, take them as directed for the full length of time directed. This includes oxygen and any inhaled medicines.  Keep all follow-up appointments as directed by your caregiver. SEEK  MEDICAL CARE IF:   Your condition does not improve in the time expected.  You have a hard time doing your normal activities even with rest.  You have any side effects or problems with the medicines prescribed.  You develop any new symptoms. SEEK IMMEDIATE MEDICAL CARE IF:   Your shortness of breath gets worse.  You feel lightheaded, faint, or develop a cough not controlled with medicines.  You start coughing up blood.  You have pain with breathing.  You have chest pain or pain in your arms, shoulders, or abdomen.  You have a fever.  You are unable to walk up stairs or exercise the way you normally do. MAKE SURE YOU:  Understand these instructions.  Will watch your condition.  Will get help right away if you are not doing well or get worse. Document Released: 08/21/2001 Document Revised: 05/27/2012 Document Reviewed: 02/11/2012 [facility] Patient Information 2014 [place] [employer], [place]. Hypertension As your heart beats, it forces blood through your arteries. This force is your blood pressure. If the pressure is too high, it is called hypertension (HTN) or high blood pressure. HTN is dangerous because you may have it and not know it. High blood pressure may mean that your heart has to work harder to pump blood. Your arteries may be narrow or stiff. The extra work puts you at risk for heart disease, stroke, and other problems.  Blood pressure consists of two numbers, a higher number over a lower, 110/72, for example. It is stated as "110 over 72." The ideal is below 120 for the top number (systolic) and  under 80 for the bottom (diastolic). Write down your blood pressure today. You should pay close attention to your blood pressure if you have certain conditions such as:  Heart failure.  Prior heart attack.  Diabetes  Chronic kidney disease.  Prior stroke.  Multiple risk factors for heart disease. To see if you have HTN, your blood pressure should be measured while you are seated  with your arm held at the level of the heart. It should be measured at least twice. A one-time elevated blood pressure reading (especially in the Emergency Department) does not mean that you need treatment. There may be conditions in which the blood pressure is different between your right and left arms. It is important to see your caregiver soon for a recheck. Most people have essential hypertension which means that there is not a specific cause. This type of high blood pressure may be lowered by changing lifestyle factors such as:  Stress.  Smoking.  Lack of exercise.  Excessive weight.  Drug/tobacco/alcohol use.  Eating less salt. Most people do not have symptoms from high blood pressure until it has caused damage to the body. Effective treatment can often prevent, delay or reduce that damage. TREATMENT  When a cause has been identified, treatment for high blood pressure is directed at the cause. There are a large number of medications to treat HTN. These fall into several categories, and your caregiver will help you select the medicines that are best for you. Medications may have side effects. You should review side effects with your caregiver. If your blood pressure stays high after you have made lifestyle changes or started on medicines,   Your medication(s) may need to be changed.  Other problems may need to be addressed.  Be certain you understand your prescriptions, and know how and when to take your medicine.  Be sure to follow up with your caregiver within the time frame advised (usually within two weeks) to have your blood pressure rechecked and to review your medications.  If you are taking more than one medicine to lower your blood pressure, make sure you know how and at what times they should be taken. Taking two medicines at the same time can result in blood pressure that is too low. SEEK IMMEDIATE MEDICAL CARE IF:  You develop a severe headache, blurred or changing  vision, or confusion.  You have unusual weakness or numbness, or a faint feeling.  You have severe chest or abdominal pain, vomiting, or breathing problems. MAKE SURE YOU:   Understand these instructions.  Will watch your condition.  Will get help right away if you are not doing well or get worse. Document Released: 11/26/2005 Document Revised: 02/18/2012 Document Reviewed: 07/16/2008 [facility] Patient Information 2014 [place].  Shingles Vaccine What You Need to Know WHAT IS SHINGLES?  Shingles is a painful skin rash, often with blisters. It is also called Herpes Zoster or just Zoster.  A shingles rash usually appears on one side of the face or body and lasts from 2 to 4 weeks. Its main symptom is pain, which can be quite severe. Other symptoms of shingles can include fever, headache, chills, and upset stomach. Very rarely, a shingles infection can lead to pneumonia, hearing problems, blindness, brain inflammation (encephalitis), or death.  For about 1 person in 5, severe pain can continue even after the rash clears up. This is called post-herpetic neuralgia.  Shingles is caused by the Varicella Zoster virus. This is the same virus that causes chickenpox.  Only someone who has had a case of chickenpox or rarely, has gotten chickenpox vaccine, can get shingles. The virus stays in your body. It can reappear many years later to cause a case of shingles.  You cannot catch shingles from another person with shingles. However, a person who has never had chickenpox (or chickenpox vaccine) could get chickenpox from someone with shingles. This is not very common.  Shingles is far more common in people 77 and older than in younger people. It is also more common in people whose immune systems are weakened because of a disease such as cancer or drugs such as steroids or chemotherapy.  At least 1 million people get shingles per year in the [place]. SHINGLES VACCINE  A vaccine for  shingles was licensed in 0932. In clinical trials, the vaccine reduced the risk of shingles by 50%. It can also reduce the pain in people who still get shingles after being vaccinated.  A single dose of shingles vaccine is recommended for adults 32 years of age and older. SOME PEOPLE SHOULD NOT GET SHINGLES VACCINE OR SHOULD WAIT A person should not get shingles vaccine if he or she:  Has ever had a life-threatening allergic reaction to gelatin, the antibiotic neomycin, or any other component of shingles vaccine. Tell your caregiver if you have any severe allergies.  Has a weakened immune system because of current:  AIDS or another disease that affects the immune system.  Treatment with drugs that affect the immune system, such as prolonged use of high-dose steroids.  Cancer treatment, such as radiation or chemotherapy.  Cancer affecting the bone marrow or lymphatic system, such as leukemia or lymphoma.  Is pregnant, or might be pregnant. Women should not become pregnant until at least 4 weeks after getting shingles vaccine. Someone with a minor illness, such as a cold, may be vaccinated. Anyone with a moderate or severe acute illness should usually wait until he or she recovers before getting the vaccine. This includes anyone with a temperature of 101.3 F (38 C) or higher. WHAT ARE THE RISKS FROM SHINGLES VACCINE?  A vaccine, like any medicine, could possibly cause serious problems, such as severe allergic reactions. However, the risk of a vaccine causing serious harm, or death, is extremely small.  No serious problems have been identified with shingles vaccine. Mild Problems  Redness, soreness, swelling, or itching at the site of the injection (about 1 person in 3).  Headache (about 1 person in 58). Like all vaccines, shingles vaccine is being closely monitored for unusual or severe problems. WHAT IF THERE IS A MODERATE OR SEVERE REACTION? What should I look for? Any unusual  condition, such as a severe allergic reaction or a high fever. If a severe allergic reaction occurred, it would be within a few minutes to an hour after the shot. Signs of a serious allergic reaction can include difficulty breathing, weakness, hoarseness or wheezing, a fast heartbeat, hives, dizziness, paleness, or swelling of the throat. What should I do?  Call your caregiver, or get the person to a caregiver right away.  Tell the caregiver what happened, the date and time it happened, and when the vaccination was given.  Ask the caregiver to report the reaction by filing a Vaccine Adverse Event Reporting System (VAERS) form. Or, you can file this report through the VAERS web site at www.vaers.SamedayNews.es or by calling (667)170-0550. VAERS does not provide medical advice. HOW CAN I LEARN MORE?  Ask your caregiver. He or she  can give you the vaccine package insert or suggest other sources of information.  Contact the Centers for Disease Control and Prevention (CDC):  Call 2483309263 (1-800-CDC-INFO).  Visit the CDC website at http://hunter.com/ CDC Shingles Vaccine VIS (09/14/08) Document Released: 09/23/2006 Document Revised: 02/18/2012 Document Reviewed: 03/18/2013 [facility] Patient Information 2014 [place].

## 2014-04-22 LAB — BMP8+EGFR
BUN/Creatinine Ratio: 15 (ref 11–26)
BUN: 13 mg/dL (ref 8–27)
CHLORIDE: 100 mmol/L (ref 97–108)
CO2: 26 mmol/L (ref 18–29)
CREATININE: 0.89 mg/dL (ref 0.57–1.00)
Calcium: 9.3 mg/dL (ref 8.7–10.3)
GFR calc Af Amer: 76 mL/min/{1.73_m2} (ref >59)
GFR calc non Af Amer: 66 mL/min/{1.73_m2} (ref >59)
Glucose: 114 mg/dL — ABNORMAL HIGH (ref 65–99)
POTASSIUM: 4.3 mmol/L (ref 3.5–5.2)
SODIUM: 141 mmol/L (ref 134–144)

## 2014-04-22 LAB — LIPID PANEL
CHOL/HDL RATIO: 2.7 ratio (ref 0.0–4.4)
CHOLESTEROL TOTAL: 175 mg/dL (ref 100–199)
HDL: 65 mg/dL (ref >39)
LDL Calculated: 92 mg/dL (ref 0–99)
TRIGLYCERIDES: 90 mg/dL (ref 0–149)
VLDL Cholesterol Cal: 18 mg/dL (ref 5–40)

## 2014-05-04 ENCOUNTER — Ambulatory Visit (INDEPENDENT_AMBULATORY_CARE_PROVIDER_SITE_OTHER): Payer: Medicare Other | Admitting: Cardiovascular Disease

## 2014-05-04 ENCOUNTER — Encounter: Payer: Self-pay | Admitting: Cardiovascular Disease

## 2014-05-04 ENCOUNTER — Encounter: Payer: Self-pay | Admitting: *Deleted

## 2014-05-04 VITALS — BP 127/75 | HR 69 | Ht 60.0 in | Wt 208.0 lb

## 2014-05-04 DIAGNOSIS — R0609 Other forms of dyspnea: Secondary | ICD-10-CM

## 2014-05-04 DIAGNOSIS — R0989 Other specified symptoms and signs involving the circulatory and respiratory systems: Secondary | ICD-10-CM | POA: Diagnosis not present

## 2014-05-04 DIAGNOSIS — R0789 Other chest pain: Secondary | ICD-10-CM | POA: Diagnosis not present

## 2014-05-04 NOTE — Progress Notes (Signed)
Patient ID: Robin Lindsey, female   DOB: [birth year], 68 y.o.   MRN: [id number]       CARDIOLOGY CONSULT NOTE  Patient ID: Robin Lindsey MRN: [id number] DOB/AGE: [birth year] 68 y.o.  Admit date: (Not on file) Primary Physician Redge Gainer, MD  Reason for Consultation: DOE, chest pressure  HPI: The patient is a 68 year old woman who has been referred for the evaluation of dyspnea on exertion pressure. She has a history of hypertension and GERD.  For the past 2 months, she has noticed that when she walks downhill to her neighbors she experiences both chest pressure and shortness of breath. When she has to walk back up the hill, she has to stop. The pressure radiates in between her shoulder blades. She denies associated lightheadedness, nausea, and diaphoresis. She also denies palpitations. She denies leg swelling, orthopnea, and paroxysmal nocturnal dyspnea. She smoked between the ages of 44 and 56.   Recent chest x-ray was normal. Recent lipid panel showed LDL 92, HDL 65. I reviewed recent blood work which demonstrated normal renal function and a normal CBC. 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  Fam: Father had CABG and died of CVA. Mother has murmur, age 54.   Allergies  Allergen Reactions  . Aspirin Nausea Only and Other (See Comments)    pain  . Nsaids     Current Outpatient Prescriptions  Medication Sig Dispense Refill  . dorzolamide-timolol (COSOPT) 22.3-6.8 MG/ML ophthalmic solution Place 1 drop into the left eye 2 (two) times daily.       Marland Kitchen latanoprost (XALATAN) 0.005 % ophthalmic solution Place 1-2 drops into both eyes 2 (two) times daily.       Marland Kitchen omeprazole (PRILOSEC) 20 MG capsule Take 1 capsule (20 mg total) by mouth daily.  30 capsule  5  . valsartan-hydrochlorothiazide (DIOVAN-HCT) 160-12.5 MG per tablet Take 1 tablet by mouth daily.  30 tablet  5   No current facility-administered medications for this  visit.    Past Medical History  Diagnosis Date  . Hypertension   . GERD (gastroesophageal reflux disease)   . Arthritis   . 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 Personal history of colonic polyps 12/09/2012    Past Surgical History  Procedure Laterality Date  . Breast lumpectomy  10 + yrs.    left breast  . Cataract extraction w/phaco  02/14/2012    Procedure: CATARACT EXTRACTION PHACO AND INTRAOCULAR LENS PLACEMENT (IOC);  Surgeon: Tonny Branch, MD;  Location: [facility];  Service: Ophthalmology;  Laterality: Left;  CDE 13.73  . Cataract extraction w/phaco  03/03/2012    Procedure: CATARACT EXTRACTION PHACO AND INTRAOCULAR LENS PLACEMENT (IOC);  Surgeon: Tonny Branch, MD;  Location: [facility];  Service: Ophthalmology;  Laterality: Right;  CDE: 16.49  . Colonoscopy      History   Social History  . Marital Status: Married    Spouse Name: N/A    Number of Children: N/A  . Years of Education: N/A   Occupational History  . Not on file.   Social History Main Topics  . Smoking status: Former Smoker -- 1.00 packs/day for 5 years    Types: Cigarettes    Start date: 03/10/1962    Quit date: 03/11/1967  . Smokeless tobacco: Never Used     Comment: smoked for 4-5 years from age 50- 39 years old  . Alcohol Use: No  .  Drug Use: No  . Sexual Activity: Not on file   Other Topics Concern  . Not on file   Social History Narrative  . No narrative on file      Prior to Admission medications   Medication Sig Start Date End Date Taking? Authorizing Provider  dorzolamide-timolol (COSOPT) 22.3-6.8 MG/ML ophthalmic solution Place 1 drop into the left eye 2 (two) times daily.  03/29/14  Yes Historical Provider, MD  latanoprost (XALATAN) 0.005 % ophthalmic solution Place 1-2 drops into both eyes 2 (two) times daily.  04/06/13  Yes Historical Provider, MD  omeprazole (PRILOSEC) 20 MG capsule Take 1 capsule (20 mg total) by mouth daily. 04/21/14  Yes Lodema Pilot, PA-C    valsartan-hydrochlorothiazide (DIOVAN-HCT) 160-12.5 MG per tablet Take 1 tablet by mouth daily. 04/21/14  Yes Lodema Pilot, PA-C     Review of systems complete and found to be negative unless listed above in HPI     Physical exam Blood pressure 127/75, pulse 69, height 5' (1.524 m), weight 208 lb (94.348 kg), SpO2 99.00%. General: NAD Neck: No JVD, no thyromegaly or thyroid nodule.  Lungs: Clear to auscultation bilaterally with normal respiratory effort. CV: Nondisplaced PMI.  Heart regular S1/S2, no S3/S4, no murmur.  No peripheral edema.  No carotid bruit.  Normal pedal pulses.  Abdomen: Soft, nontender, no hepatosplenomegaly, no distention.  Skin: Intact without lesions or rashes.  Neurologic: Alert and oriented x 3.  Psych: Normal affect. Extremities: No clubbing or cyanosis.  HEENT: Normal.   Labs:   Lab Results  Component Value Date   WBC 7.0 04/21/2014   HGB 13.4 04/21/2014   HCT 41.3 04/21/2014   MCV 90.8 04/21/2014   No results found for this basename: NA, K, CL, CO2, BUN, CREATININE, CALCIUM, LABALBU, PROT, BILITOT, ALKPHOS, ALT, AST, GLUCOSE,  in the last 168 hours No results found for this basename: CKTOTAL, CKMB, CKMBINDEX, TROPONINI    Lab Results  Component Value Date   CHOL 183 04/23/2013   Lab Results  Component Value Date   HDL 65 04/21/2014   HDL 57 04/23/2013   Lab Results  Component Value Date   LDLCALC 92 04/21/2014   LDLCALC 108* 04/23/2013   Lab Results  Component Value Date   TRIG 90 04/21/2014   TRIG 88 04/23/2013   Lab Results  Component Value Date   CHOLHDL 2.7 04/21/2014   CHOLHDL 3.2 04/23/2013   No results found for this basename: LDLDIRECT         Studies: No results found.  ASSESSMENT AND PLAN:  1. Chest pressure and dyspnea on exertion:  Other than hypertension and elevated BMI, she does not appear to have several risk factors for ischemic heart disease. She does attest to lack of physical activity. Her father had CABG and  stroke. In order to comprehensively evaluate this, and not to overlook potentially atypical symptoms, I will proceed with an echocardiogram and exercise Cardiolite stress test.  Dispo: f/u 1 month.  Signed: Kate Sable, M.D., F.A.C.C.  05/04/2014, 1:58 PM

## 2014-05-04 NOTE — Patient Instructions (Signed)
Your physician has requested that you have an echocardiogram. Echocardiography is a painless test that uses sound waves to create images of your heart. It provides your doctor with information about the size and shape of your heart and how well your heart's chambers and valves are working. This procedure takes approximately one hour. There are no restrictions for this procedure. Your physician has requested that you have en exercise stress myoview. For further information please visit [email or web address]. Please follow instruction sheet, as given. Office will contact with results via phone or letter.   Continue all medications.   Follow up in  1 month

## 2014-05-04 NOTE — Addendum Note (Signed)
Addended by: Laurine Blazer on: 05/04/2014 02:23 PM   Modules accepted: Orders

## 2014-05-12 ENCOUNTER — Encounter (HOSPITAL_COMMUNITY)
Admission: RE | Admit: 2014-05-12 | Discharge: 2014-05-12 | Disposition: A | Discharge status: Home / Self Care | Payer: Medicare Other | Source: Ambulatory Visit | Attending: Cardiovascular Disease | Admitting: Cardiovascular Disease

## 2014-05-12 ENCOUNTER — Encounter (HOSPITAL_COMMUNITY): Payer: Self-pay

## 2014-05-12 ENCOUNTER — Ambulatory Visit (HOSPITAL_COMMUNITY)
Admission: RE | Admit: 2014-05-12 | Discharge: 2014-05-12 | Disposition: A | Discharge status: Home / Self Care | Payer: Medicare Other | Source: Ambulatory Visit | Attending: Cardiovascular Disease | Admitting: Cardiovascular Disease

## 2014-05-12 DIAGNOSIS — R0609 Other forms of dyspnea: Secondary | ICD-10-CM

## 2014-05-12 DIAGNOSIS — R0789 Other chest pain: Secondary | ICD-10-CM

## 2014-05-12 DIAGNOSIS — R079 Chest pain, unspecified: Secondary | ICD-10-CM | POA: Insufficient documentation

## 2014-05-12 DIAGNOSIS — R0989 Other specified symptoms and signs involving the circulatory and respiratory systems: Secondary | ICD-10-CM | POA: Insufficient documentation

## 2014-05-12 DIAGNOSIS — I059 Rheumatic mitral valve disease, unspecified: Secondary | ICD-10-CM | POA: Insufficient documentation

## 2014-05-12 DIAGNOSIS — Z87891 Personal history of nicotine dependence: Secondary | ICD-10-CM | POA: Diagnosis not present

## 2014-05-12 DIAGNOSIS — I10 Essential (primary) hypertension: Secondary | ICD-10-CM | POA: Insufficient documentation

## 2014-05-12 DIAGNOSIS — I379 Nonrheumatic pulmonary valve disorder, unspecified: Secondary | ICD-10-CM

## 2014-05-12 MED ORDER — SODIUM CHLORIDE 0.9 % IJ SOLN
INTRAMUSCULAR | Status: AC
  Administered 2014-05-12: 10 mL via INTRAVENOUS
  Filled 2014-05-12: qty 10

## 2014-05-12 MED ORDER — TECHNETIUM TC 99M SESTAMIBI GENERIC - CARDIOLITE
10.0000 | Freq: Once | INTRAVENOUS | Status: AC | PRN
  Administered 2014-05-12: 10 via INTRAVENOUS

## 2014-05-12 MED ORDER — TECHNETIUM TC 99M SESTAMIBI - CARDIOLITE
30.0000 | Freq: Once | INTRAVENOUS | Status: AC | PRN
  Administered 2014-05-12: 12:00:00 30 via INTRAVENOUS

## 2014-05-12 MED ORDER — REGADENOSON 0.4 MG/5ML IV SOLN
INTRAVENOUS | Status: AC
  Filled 2014-05-12: qty 5

## 2014-05-12 NOTE — Progress Notes (Signed)
Stress Lab Nurses Notes - Robin Lindsey 05/12/2014 Reason for doing test: Chest Pain and Dyspnea Type of test: Stress Cardiolite Nurse performing test: Gerrit Halls, RN Nuclear Medicine Tech: Melburn Hake Echo Tech: Not Applicable MD performing test: Koneswaran/M.Bonnell Public PA Family MD: Laurance Flatten Test explained and consent signed: yes IV started: 22g jelco, Saline lock flushed, No redness or edema and Saline lock started in radiology Symptoms: SOB Treatment/Intervention: None Reason test stopped: fatigue and SOB After recovery IV was: Discontinued via X-ray tech and No redness or edema Patient to return to [place]. Med at : 12:00 Patient discharged: Home Patient's Condition upon discharge was: stable Comments: During test peak BP 172/63 & HR 136.  Recovery BP 145/57 & HR 86.  Symptoms resolved in recovery. Robin Lindsey

## 2014-05-12 NOTE — Progress Notes (Signed)
  Echocardiogram 2D Echocardiogram has been performed.  Basilia Jumbo 05/12/2014, 8:47 AM

## 2014-05-17 ENCOUNTER — Telehealth: Payer: Self-pay | Admitting: *Deleted

## 2014-05-17 NOTE — Telephone Encounter (Signed)
ECHO -   Notes Recorded by Herminio Commons, MD on 05/12/2014 at 3:03 PM Fairly good pumping function.  ========================================================= STRESS TEST -   Notes Recorded by Herminio Commons, MD on 05/12/2014 at 12:59 PM Please inform pt of normal results.

## 2014-05-17 NOTE — Telephone Encounter (Signed)
Notes Recorded by Laurine Blazer, LPN on 0/08/4075 at 8:08 PM Patient notified and verbalized understanding. Follow up scheduled for 06/04/2014 with Dr. Bronson Ing.

## 2014-05-18 DIAGNOSIS — H4011X0 Primary open-angle glaucoma, stage unspecified: Secondary | ICD-10-CM | POA: Diagnosis not present

## 2014-06-04 ENCOUNTER — Ambulatory Visit (INDEPENDENT_AMBULATORY_CARE_PROVIDER_SITE_OTHER): Payer: Medicare Other | Admitting: Cardiovascular Disease

## 2014-06-04 ENCOUNTER — Encounter: Payer: Self-pay | Admitting: Cardiovascular Disease

## 2014-06-04 VITALS — BP 115/71 | HR 66 | Ht 60.0 in | Wt 209.0 lb

## 2014-06-04 DIAGNOSIS — Z136 Encounter for screening for cardiovascular disorders: Secondary | ICD-10-CM | POA: Diagnosis not present

## 2014-06-04 DIAGNOSIS — R0609 Other forms of dyspnea: Secondary | ICD-10-CM

## 2014-06-04 DIAGNOSIS — R0789 Other chest pain: Secondary | ICD-10-CM | POA: Diagnosis not present

## 2014-06-04 DIAGNOSIS — IMO0001 Reserved for inherently not codable concepts without codable children: Secondary | ICD-10-CM

## 2014-06-04 DIAGNOSIS — R0989 Other specified symptoms and signs involving the circulatory and respiratory systems: Secondary | ICD-10-CM | POA: Diagnosis not present

## 2014-06-04 NOTE — Patient Instructions (Signed)
Continue all current medications. Your physician wants you to follow up in: 6 months.  You will receive a reminder letter in the mail one-two months in advance.  If you don't receive a letter, please call our office to schedule the follow up appointment   

## 2014-06-04 NOTE — Progress Notes (Signed)
Patient ID: Robin Lindsey, female   DOB: Jun 07, 1946, 69 y.o.   MRN: [id number]      SUBJECTIVE: The patient returns for followup on the results of cardiovascular testing performed for evaluation of exertional chest pressure and dyspnea. Echocardiography demonstrated low normal left ventricular systolic function with EF approximately 50% and borderline diffuse global hypokinesis, along with mild LVH and grade 1 diastolic dysfunction. Exercise Cardiolite stress test was normal.    Allergies  Allergen Reactions  . Aspirin Nausea Only and Other (See Comments)    pain  . Nsaids     Current Outpatient Prescriptions  Medication Sig Dispense Refill  . dorzolamide-timolol (COSOPT) 22.3-6.8 MG/ML ophthalmic solution Place 1 drop into the left eye 2 (two) times daily.       Marland Kitchen latanoprost (XALATAN) 0.005 % ophthalmic solution Place 1-2 drops into both eyes 2 (two) times daily.       Marland Kitchen omeprazole (PRILOSEC) 20 MG capsule Take 1 capsule (20 mg total) by mouth daily.  30 capsule  5  . valsartan-hydrochlorothiazide (DIOVAN-HCT) 160-12.5 MG per tablet Take 1 tablet by mouth daily.  30 tablet  5   No current facility-administered medications for this visit.    Past Medical History  Diagnosis Date  . Hypertension   . GERD (gastroesophageal reflux disease)   . Arthritis   . 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 Personal history of colonic polyps 12/09/2012    Past Surgical History  Procedure Laterality Date  . Breast lumpectomy  10 + yrs.    left breast  . Cataract extraction w/phaco  02/14/2012    Procedure: CATARACT EXTRACTION PHACO AND INTRAOCULAR LENS PLACEMENT (IOC);  Surgeon: Tonny Branch, MD;  Location: [facility];  Service: Ophthalmology;  Laterality: Left;  CDE 13.73  . Cataract extraction w/phaco  03/03/2012    Procedure: CATARACT EXTRACTION PHACO AND INTRAOCULAR LENS PLACEMENT (IOC);  Surgeon: Tonny Branch, MD;  Location: [facility];  Service: Ophthalmology;  Laterality: Right;  CDE:  16.49  . Colonoscopy      History   Social History  . Marital Status: Married    Spouse Name: N/A    Number of Children: N/A  . Years of Education: N/A   Occupational History  . Not on file.   Social History Main Topics  . Smoking status: Former Smoker -- 1.00 packs/day for 5 years    Types: Cigarettes    Start date: 03/10/1962    Quit date: 03/11/1967  . Smokeless tobacco: Never Used     Comment: smoked for 4-5 years from age 25- 32 years old  . Alcohol Use: No  . Drug Use: No  . Sexual Activity: Not on file   Other Topics Concern  . Not on file   Social History Narrative  . No narrative on file     Filed Vitals:   06/04/14 1348  BP: 115/71  Pulse: 66  Height: 5' (1.524 m)  Weight: 209 lb (94.802 kg)    PHYSICAL EXAM General: NAD Neck: No JVD, no thyromegaly. Lungs: Clear to auscultation bilaterally with normal respiratory effort. CV: Nondisplaced PMI.  Regular rate and rhythm, normal S1/S2, no S3/S4, no murmur. No pretibial or periankle edema.  No carotid bruit.  Normal pedal pulses.  Abdomen: Soft, nontender, no hepatosplenomegaly, no distention.  Neurologic: Alert and oriented x 3.  Psych: Normal affect. Extremities: No clubbing or cyanosis.   ECG: reviewed and available in electronic records.  ASSESSMENT AND PLAN: 1. Chest pressure and exertional dyspnea: In light of a normal exercise nuclear myocardial perfusion study, I do not feel any further testing is warranted. Some of her symptoms could be related to cardiovascular deconditioning. I will continue to monitor her symptoms and informed her that if they should progress in frequency or severity, I would then consider further testing. She is in agreement with this plan.  Dispo: f/u 6 months.  Kate Sable, M.D., F.A.C.C.

## 2014-06-08 DIAGNOSIS — H4011X0 Primary open-angle glaucoma, stage unspecified: Secondary | ICD-10-CM | POA: Diagnosis not present

## 2014-06-08 DIAGNOSIS — Z961 Presence of intraocular lens: Secondary | ICD-10-CM | POA: Diagnosis not present

## 2014-06-08 DIAGNOSIS — H26499 Other secondary cataract, unspecified eye: Secondary | ICD-10-CM | POA: Diagnosis not present

## 2014-09-02 IMAGING — CR DG CHEST 2V
2 series · 2 of 2 positions shown · non-contrast
Comparison: None.

CLINICAL DATA: Difficulty breathing

EXAM:
CHEST  2 VIEW

[view not recorded (1 of 2)]
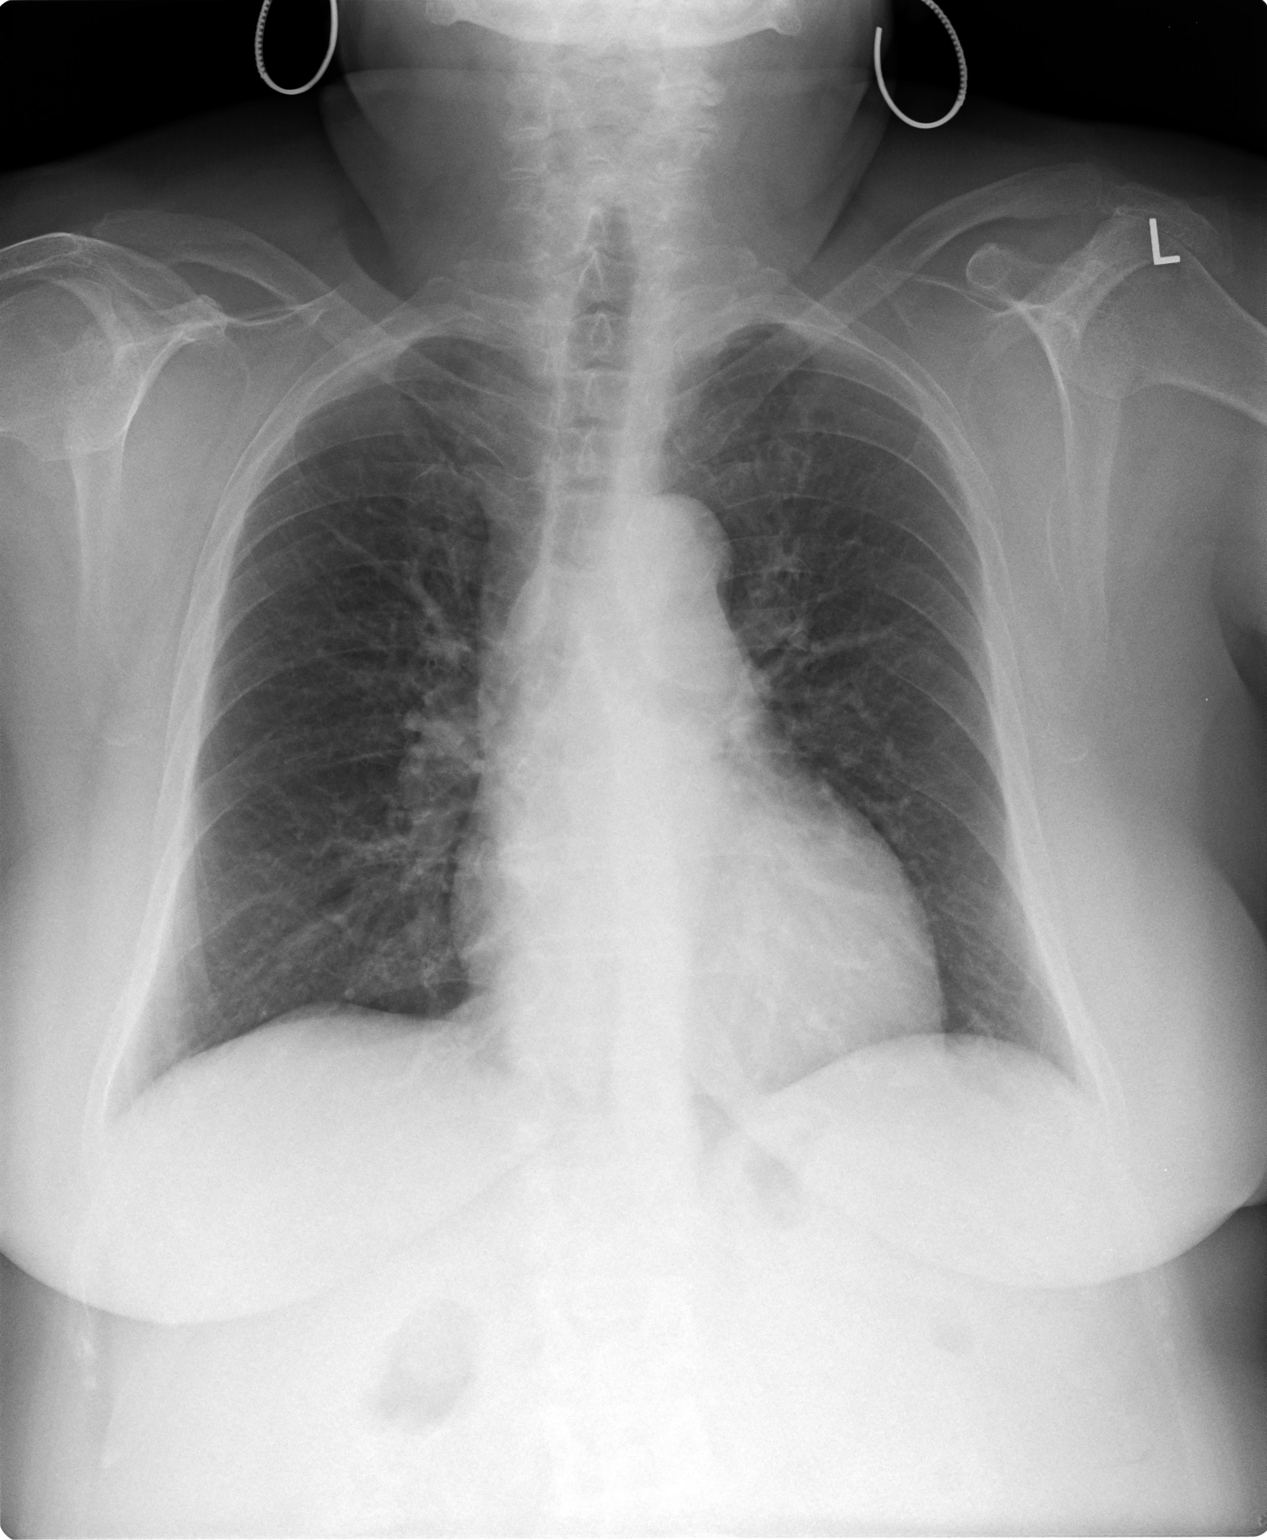

[view not recorded (2 of 2)]
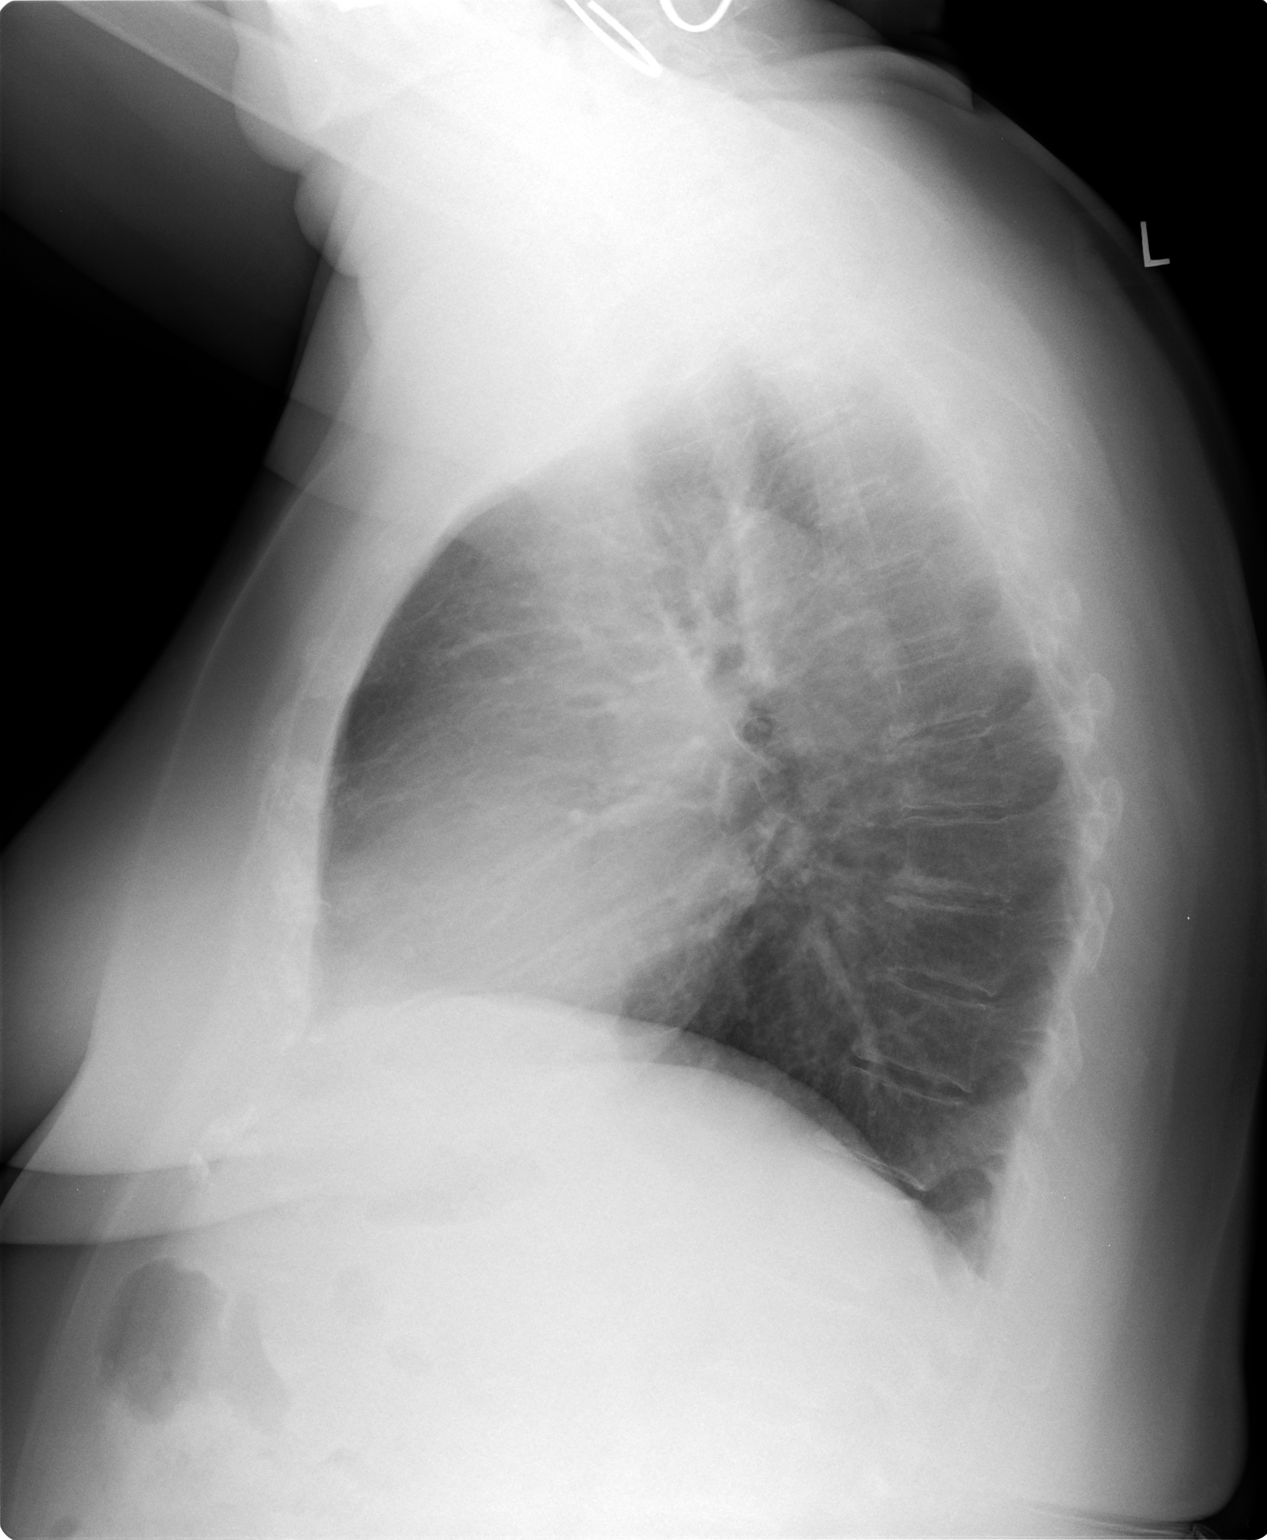

[2 of 2 positions shown; findings below may reference images not displayed]

FINDINGS: Lungs are clear. Heart size and pulmonary vascularity are normal. No
adenopathy. There is mild degenerative change in the thoracic spine.
IMPRESSION: No edema or consolidation.

## 2014-09-22 ENCOUNTER — Ambulatory Visit (INDEPENDENT_AMBULATORY_CARE_PROVIDER_SITE_OTHER): Payer: Medicare Other

## 2014-09-22 DIAGNOSIS — Z23 Encounter for immunization: Secondary | ICD-10-CM | POA: Diagnosis not present

## 2014-10-05 DIAGNOSIS — Z961 Presence of intraocular lens: Secondary | ICD-10-CM | POA: Diagnosis not present

## 2014-10-05 DIAGNOSIS — H4011X3 Primary open-angle glaucoma, severe stage: Secondary | ICD-10-CM | POA: Diagnosis not present

## 2014-10-05 DIAGNOSIS — H4011X1 Primary open-angle glaucoma, mild stage: Secondary | ICD-10-CM | POA: Diagnosis not present

## 2014-11-08 ENCOUNTER — Other Ambulatory Visit: Payer: Self-pay | Admitting: Physician Assistant

## 2014-11-08 ENCOUNTER — Other Ambulatory Visit: Payer: Self-pay | Admitting: Family Medicine

## 2014-11-16 ENCOUNTER — Other Ambulatory Visit: Payer: Self-pay | Admitting: Obstetrics & Gynecology

## 2014-11-16 DIAGNOSIS — B373 Candidiasis of vulva and vagina: Secondary | ICD-10-CM | POA: Diagnosis not present

## 2014-11-16 DIAGNOSIS — Z01419 Encounter for gynecological examination (general) (routine) without abnormal findings: Secondary | ICD-10-CM | POA: Diagnosis not present

## 2014-11-16 DIAGNOSIS — Z124 Encounter for screening for malignant neoplasm of cervix: Secondary | ICD-10-CM | POA: Diagnosis not present

## 2014-11-17 LAB — CYTOLOGY - PAP

## 2014-11-22 DIAGNOSIS — Z1231 Encounter for screening mammogram for malignant neoplasm of breast: Secondary | ICD-10-CM | POA: Diagnosis not present

## 2014-11-30 DIAGNOSIS — N63 Unspecified lump in breast: Secondary | ICD-10-CM | POA: Diagnosis not present

## 2014-11-30 DIAGNOSIS — R928 Other abnormal and inconclusive findings on diagnostic imaging of breast: Secondary | ICD-10-CM | POA: Diagnosis not present

## 2014-12-06 ENCOUNTER — Other Ambulatory Visit: Payer: Self-pay | Admitting: *Deleted

## 2014-12-06 MED ORDER — OMEPRAZOLE 20 MG PO CPDR: DELAYED_RELEASE_CAPSULE | ORAL | Status: DC

## 2014-12-06 MED ORDER — VALSARTAN-HYDROCHLOROTHIAZIDE 160-12.5 MG PO TABS: ORAL_TABLET | ORAL | Status: DC

## 2014-12-28 ENCOUNTER — Telehealth: Payer: Self-pay | Admitting: Cardiovascular Disease

## 2014-12-28 NOTE — Telephone Encounter (Signed)
Patient has recall in for this past Dec 2015.  She wants to know since she is doing well with no problems is her follow up needed.

## 2014-12-28 NOTE — Telephone Encounter (Signed)
If she is doing well, she can f/u prn.

## 2014-12-28 NOTE — Telephone Encounter (Signed)
Patient informed that she can follow up as needed.

## 2015-01-06 ENCOUNTER — Other Ambulatory Visit: Payer: Self-pay | Admitting: Physician Assistant

## 2015-01-31 ENCOUNTER — Other Ambulatory Visit: Payer: Self-pay | Admitting: Family Medicine

## 2015-01-31 NOTE — Telephone Encounter (Signed)
Appointment scheduled for 1:20 with [facility]

## 2015-02-08 DIAGNOSIS — M7731 Calcaneal spur, right foot: Secondary | ICD-10-CM | POA: Diagnosis not present

## 2015-02-08 DIAGNOSIS — M722 Plantar fascial fibromatosis: Secondary | ICD-10-CM | POA: Diagnosis not present

## 2015-02-09 ENCOUNTER — Ambulatory Visit (INDEPENDENT_AMBULATORY_CARE_PROVIDER_SITE_OTHER): Payer: Medicare Other | Admitting: Family

## 2015-02-09 ENCOUNTER — Encounter: Payer: Self-pay | Admitting: Family

## 2015-02-09 VITALS — BP 132/59 | HR 65 | Temp 97.2°F | Ht 60.0 in | Wt 213.8 lb

## 2015-02-09 DIAGNOSIS — I10 Essential (primary) hypertension: Secondary | ICD-10-CM | POA: Diagnosis not present

## 2015-02-09 DIAGNOSIS — K219 Gastro-esophageal reflux disease without esophagitis: Secondary | ICD-10-CM

## 2015-02-09 DIAGNOSIS — M722 Plantar fascial fibromatosis: Secondary | ICD-10-CM | POA: Diagnosis not present

## 2015-02-09 DIAGNOSIS — Z1321 Encounter for screening for nutritional disorder: Secondary | ICD-10-CM | POA: Diagnosis not present

## 2015-02-09 DIAGNOSIS — E559 Vitamin D deficiency, unspecified: Secondary | ICD-10-CM | POA: Diagnosis not present

## 2015-02-09 DIAGNOSIS — R5383 Other fatigue: Secondary | ICD-10-CM

## 2015-02-09 MED ORDER — OMEPRAZOLE 20 MG PO CPDR: DELAYED_RELEASE_CAPSULE | ORAL | Status: DC

## 2015-02-09 MED ORDER — VALSARTAN-HYDROCHLOROTHIAZIDE 160-12.5 MG PO TABS: ORAL_TABLET | ORAL | Status: DC

## 2015-02-09 NOTE — Progress Notes (Signed)
Subjective:    Patient ID: Robin Lindsey, female    DOB: 05/13/1946, 69 y.o.   MRN: [id number]  Hypertension This is a chronic problem. The current episode started more than 1 year ago. The problem has been resolved since onset. The problem is controlled. Associated symptoms include peripheral edema ("trace amt at times"). Pertinent negatives include no anxiety, headaches, malaise/fatigue, palpitations or shortness of breath. Risk factors for coronary artery disease include obesity, post-menopausal state, family history and sedentary lifestyle. Past treatments include diuretics and angiotensin blockers. The current treatment provides significant improvement. There is no history of kidney disease, CAD/MI, CVA, heart failure or a thyroid problem. There is no history of sleep apnea.  Gastrophageal Reflux She reports no belching, no coughing, no heartburn, no sore throat or no wheezing. This is a chronic problem. The current episode started more than 1 year ago. The problem occurs rarely. The problem has been resolved. The symptoms are aggravated by certain foods. Risk factors include obesity. She has tried a PPI for the symptoms. The treatment provided significant relief.      Review of Systems  Constitutional: Negative.  Negative for malaise/fatigue.  HENT: Negative.  Negative for sore throat.   Eyes: Negative.   Respiratory: Negative.  Negative for cough, shortness of breath and wheezing.   Cardiovascular: Negative.  Negative for palpitations.  Gastrointestinal: Negative.  Negative for heartburn.  Endocrine: Negative.   Genitourinary: Negative.   Musculoskeletal: Negative.   Neurological: Negative.  Negative for headaches.  Hematological: Negative.   Psychiatric/Behavioral: Negative.   All other systems reviewed and are negative.      Objective:   Physical Exam  Constitutional: She is oriented to person, place, and time. She appears well-developed and well-nourished. No distress.    HENT:  Head: Normocephalic and atraumatic.  Right Ear: External ear normal.  Mouth/Throat: Oropharynx is clear and moist.  Eyes: Pupils are equal, round, and reactive to light.  Neck: Normal range of motion. Neck supple. No thyromegaly present.  Cardiovascular: Normal rate, regular rhythm, normal heart sounds and intact distal pulses.   No murmur heard. Pulmonary/Chest: Effort normal and breath sounds normal. No respiratory distress. She has no wheezes.  Abdominal: Soft. Bowel sounds are normal. She exhibits no distension. There is no tenderness.  Musculoskeletal: Normal range of motion. She exhibits edema (trace amt in BLE) and tenderness (right heel pain and tenderness).  Neurological: She is alert and oriented to person, place, and time. She has normal reflexes. No cranial nerve deficit.  Skin: Skin is warm and dry.  Psychiatric: She has a normal mood and affect. Her behavior is normal. Judgment and thought content normal.  Vitals reviewed.   BP 132/59 mmHg  Pulse 65  Temp(Src) 97.2 F (36.2 C) (Oral)  Ht 5' (1.524 m)  Wt 213 lb 12.8 oz (96.979 kg)  BMI 41.75 kg/m2       Assessment & Plan:  1. Essential hypertension - CMP14+EGFR  2. Gastroesophageal reflux disease, esophagitis presence not specified - CMP14+EGFR  3. Encounter for vitamin deficiency screening - CMP14+EGFR - Vit D  25 hydroxy (rtn osteoporosis monitoring)  4. Other fatigue - CMP14+EGFR - Thyroid Panel With TSH - Vit D  25 hydroxy (rtn osteoporosis monitoring)   Continue all meds Labs pending Health Maintenance reviewed Diet and exercise encouraged RTO 1 year  Evelina Dun, FNP

## 2015-02-09 NOTE — Patient Instructions (Addendum)
Health Maintenance Adopting a healthy lifestyle and getting preventive care can go a long way to promote health and wellness. Talk with your health care provider about what schedule of regular examinations is right for you. This is a good chance for you to check in with your provider about disease prevention and staying healthy. In between checkups, there are plenty of things you can do on your own. Experts have done a lot of research about which lifestyle changes and preventive measures are most likely to keep you healthy. Ask your health care provider for more information. WEIGHT AND DIET  Eat a healthy diet  Be sure to include plenty of vegetables, fruits, low-fat dairy products, and lean protein.  Do not eat a lot of foods high in solid fats, added sugars, or salt.  Get regular exercise. This is one of the most important things you can do for your health.  Most adults should exercise for at least 150 minutes each week. The exercise should increase your heart rate and make you sweat (moderate-intensity exercise).  Most adults should also do strengthening exercises at least twice a week. This is in addition to the moderate-intensity exercise.  Maintain a healthy weight  Body mass index (BMI) is a measurement that can be used to identify possible weight problems. It estimates body fat based on height and weight. Your health care provider can help determine your BMI and help you achieve or maintain a healthy weight.  For females 25 years of age and older:   A BMI below 18.5 is considered underweight.  A BMI of 18.5 to 24.9 is normal.  A BMI of 25 to 29.9 is considered overweight.  A BMI of 30 and above is considered obese.  Watch levels of cholesterol and blood lipids  You should start having your blood tested for lipids and cholesterol at 71 years of age, then have this test every 5 years.  You may need to have your cholesterol levels checked more often if:  Your lipid or  cholesterol levels are high.  You are older than 71 years of age.  You are at high risk for heart disease.  CANCER SCREENING   Lung Cancer  Lung cancer screening is recommended for adults 97-92 years old who are at high risk for lung cancer because of a history of smoking.  A yearly low-dose CT scan of the lungs is recommended for people who:  Currently smoke.  Have quit within the past 15 years.  Have at least a 30-pack-year history of smoking. A pack year is smoking an average of one pack of cigarettes a day for 1 year.  Yearly screening should continue until it has been 15 years since you quit.  Yearly screening should stop if you develop a health problem that would prevent you from having lung cancer treatment.  Breast Cancer  Practice breast self-awareness. This means understanding how your breasts normally appear and feel.  It also means doing regular breast self-exams. Let your health care provider know about any changes, no matter how small.  If you are in your 20s or 30s, you should have a clinical breast exam (CBE) by a health care provider every 1-3 years as part of a regular health exam.  If you are 76 or older, have a CBE every year. Also consider having a breast X-ray (mammogram) every year.  If you have a family history of breast cancer, talk to your health care provider about genetic screening.  If you are  at high risk for breast cancer, talk to your health care provider about having an MRI and a mammogram every year.  Breast cancer gene (BRCA) assessment is recommended for women who have family members with BRCA-related cancers. BRCA-related cancers include:  Breast.  Ovarian.  Tubal.  Peritoneal cancers.  Results of the assessment will determine the need for genetic counseling and BRCA1 and BRCA2 testing. Cervical Cancer Routine pelvic examinations to screen for cervical cancer are no longer recommended for nonpregnant women who are considered low  risk for cancer of the pelvic organs (ovaries, uterus, and vagina) and who do not have symptoms. A pelvic examination may be necessary if you have symptoms including those associated with pelvic infections. Ask your health care provider if a screening pelvic exam is right for you.   The Pap test is the screening test for cervical cancer for women who are considered at risk.  If you had a hysterectomy for a problem that was not cancer or a condition that could lead to cancer, then you no longer need Pap tests.  If you are older than 65 years, and you have had normal Pap tests for the past 10 years, you no longer need to have Pap tests.  If you have had past treatment for cervical cancer or a condition that could lead to cancer, you need Pap tests and screening for cancer for at least 20 years after your treatment.  If you no longer get a Pap test, assess your risk factors if they change (such as having a new sexual partner). This can affect whether you should start being screened again.  Some women have medical problems that increase their chance of getting cervical cancer. If this is the case for you, your health care provider may recommend more frequent screening and Pap tests.  The human papillomavirus (HPV) test is another test that may be used for cervical cancer screening. The HPV test looks for the virus that can cause cell changes in the cervix. The cells collected during the Pap test can be tested for HPV.  The HPV test can be used to screen women 30 years of age and older. Getting tested for HPV can extend the interval between normal Pap tests from three to five years.  An HPV test also should be used to screen women of any age who have unclear Pap test results.  After 71 years of age, women should have HPV testing as often as Pap tests.  Colorectal Cancer  This type of cancer can be detected and often prevented.  Routine colorectal cancer screening usually begins at 71 years of  age and continues through 71 years of age.  Your health care provider may recommend screening at an earlier age if you have risk factors for colon cancer.  Your health care provider may also recommend using home test kits to check for hidden blood in the stool.  A small camera at the end of a tube can be used to examine your colon directly (sigmoidoscopy or colonoscopy). This is done to check for the earliest forms of colorectal cancer.  Routine screening usually begins at age 50.  Direct examination of the colon should be repeated every 5-10 years through 71 years of age. However, you may need to be screened more often if early forms of precancerous polyps or small growths are found. Skin Cancer  Check your skin from head to toe regularly.  Tell your health care provider about any new moles or changes in   moles, especially if there is a change in a mole's shape or color.  Also tell your health care provider if you have a mole that is larger than the size of a pencil eraser.  Always use sunscreen. Apply sunscreen liberally and repeatedly throughout the day.  Protect yourself by wearing long sleeves, pants, a wide-brimmed hat, and sunglasses whenever you are outside. HEART DISEASE, DIABETES, AND HIGH BLOOD PRESSURE   Have your blood pressure checked at least every 1-2 years. High blood pressure causes heart disease and increases the risk of stroke.  If you are between 75 years and 42 years old, ask your health care provider if you should take aspirin to prevent strokes.  Have regular diabetes screenings. This involves taking a blood sample to check your fasting blood sugar level.  If you are at a normal weight and have a low risk for diabetes, have this test once every three years after 71 years of age.  If you are overweight and have a high risk for diabetes, consider being tested at a younger age or more often. PREVENTING INFECTION  Hepatitis B  If you have a higher risk for  hepatitis B, you should be screened for this virus. You are considered at high risk for hepatitis B if:  You were born in a country where hepatitis B is common. Ask your health care provider which countries are considered high risk.  Your parents were born in a high-risk country, and you have not been immunized against hepatitis B (hepatitis B vaccine).  You have HIV or AIDS.  You use needles to inject street drugs.  You live with someone who has hepatitis B.  You have had sex with someone who has hepatitis B.  You get hemodialysis treatment.  You take certain medicines for conditions, including cancer, organ transplantation, and autoimmune conditions. Hepatitis C  Blood testing is recommended for:  Everyone born from [birth year] through 1967.  Anyone with known risk factors for hepatitis C. Sexually transmitted infections (STIs)  You should be screened for sexually transmitted infections (STIs) including gonorrhea and chlamydia if:  You are sexually active and are younger than 71 years of age.  You are older than 71 years of age and your health care provider tells you that you are at risk for this type of infection.  Your sexual activity has changed since you were last screened and you are at an increased risk for chlamydia or gonorrhea. Ask your health care provider if you are at risk.  If you do not have HIV, but are at risk, it may be recommended that you take a prescription medicine daily to prevent HIV infection. This is called pre-exposure prophylaxis (PrEP). You are considered at risk if:  You are sexually active and do not regularly use condoms or know the HIV status of your partner(s).  You take drugs by injection.  You are sexually active with a partner who has HIV. Talk with your health care provider about whether you are at high risk of being infected with HIV. If you choose to begin PrEP, you should first be tested for HIV. You should then be tested every 3 months for  as long as you are taking PrEP.  PREGNANCY   If you are premenopausal and you may become pregnant, ask your health care provider about preconception counseling.  If you may become pregnant, take 400 to 800 micrograms (mcg) of folic acid every day.  If you want to prevent pregnancy, talk to your  health care provider about birth control (contraception). OSTEOPOROSIS AND MENOPAUSE   Osteoporosis is a disease in which the bones lose minerals and strength with aging. This can result in serious bone fractures. Your risk for osteoporosis can be identified using a bone density scan.  If you are 40 years of age or older, or if you are at risk for osteoporosis and fractures, ask your health care provider if you should be screened.  Ask your health care provider whether you should take a calcium or vitamin D supplement to lower your risk for osteoporosis.  Menopause may have certain physical symptoms and risks.  Hormone replacement therapy may reduce some of these symptoms and risks. Talk to your health care provider about whether hormone replacement therapy is right for you.  HOME CARE INSTRUCTIONS   Schedule regular health, dental, and eye exams.  Stay current with your immunizations.   Do not use any tobacco products including cigarettes, chewing tobacco, or electronic cigarettes.  If you are pregnant, do not drink alcohol.  If you are breastfeeding, limit how much and how often you drink alcohol.  Limit alcohol intake to no more than 1 drink per day for nonpregnant women. One drink equals 12 ounces of beer, 5 ounces of wine, or 1 ounces of hard liquor.  Do not use street drugs.  Do not share needles.  Ask your health care provider for help if you need support or information about quitting drugs.  Tell your health care provider if you often feel depressed.  Tell your health care provider if you have ever been abused or do not feel safe at home. Document Released: 06/11/2011  Document Revised: 04/12/2014 Document Reviewed: 10/28/2013 [facility] Patient Information 2015 [place], [place]. This information is not intended to replace advice given to you by your health care provider. Make sure you discuss any questions you have with your health care provider. Plantar Fasciitis Plantar fasciitis is a common condition that causes foot pain. It is soreness (inflammation) of the band of tough fibrous tissue on the bottom of the foot that runs from the heel bone (calcaneus) to the ball of the foot. The cause of this soreness may be from excessive standing, poor fitting shoes, running on hard surfaces, being overweight, having an abnormal walk, or overuse (this is common in runners) of the painful foot or feet. It is also common in aerobic exercise dancers and ballet dancers. SYMPTOMS  Most people with plantar fasciitis complain of:  Severe pain in the morning on the bottom of their foot especially when taking the first steps out of bed. This pain recedes after a few minutes of walking.  Severe pain is experienced also during walking following a long period of inactivity.  Pain is worse when walking barefoot or up stairs DIAGNOSIS   Your caregiver will diagnose this condition by examining and feeling your foot.  Special tests such as X-rays of your foot, are usually not needed. PREVENTION   Consult a sports medicine professional before beginning a new exercise program.  Walking programs offer a good workout. With walking there is a lower chance of overuse injuries common to runners. There is less impact and less jarring of the joints.  Begin all new exercise programs slowly. If problems or pain develop, decrease the amount of time or distance until you are at a comfortable level.  Wear good shoes and replace them regularly.  Stretch your foot and the heel cords at the back of the ankle (Achilles tendon) both  before and after exercise.  Run or exercise on even surfaces that  are not hard. For example, asphalt is better than pavement.  Do not run barefoot on hard surfaces.  If using a treadmill, vary the incline.  Do not continue to workout if you have foot or joint problems. Seek professional help if they do not improve. HOME CARE INSTRUCTIONS   Avoid activities that cause you pain until you recover.  Use ice or cold packs on the problem or painful areas after working out.  Only take over-the-counter or prescription medicines for pain, discomfort, or fever as directed by your caregiver.  Soft shoe inserts or athletic shoes with air or gel sole cushions may be helpful.  If problems continue or become more severe, consult a sports medicine caregiver or your own health care provider. Cortisone is a potent anti-inflammatory medication that may be injected into the painful area. You can discuss this treatment with your caregiver. MAKE SURE YOU:   Understand these instructions.  Will watch your condition.  Will get help right away if you are not doing well or get worse. Document Released: 08/21/2001 Document Revised: 02/18/2012 Document Reviewed: 10/20/2008 [facility] Patient Information 2015 [place], [place]. This information is not intended to replace advice given to you by your health care provider. Make sure you discuss any questions you have with your health care provider.

## 2015-02-10 LAB — THYROID PANEL WITH TSH
Free Thyroxine Index: 1.9 (ref 1.2–4.9)
T3 UPTAKE RATIO: 25 % (ref 24–39)
T4 TOTAL: 7.5 ug/dL (ref 4.5–12.0)
TSH: 1.77 u[IU]/mL (ref 0.450–4.500)

## 2015-02-10 LAB — CMP14+EGFR
ALT: 14 IU/L (ref 0–32)
AST: 13 IU/L (ref 0–40)
Albumin/Globulin Ratio: 1.5 (ref 1.1–2.5)
Albumin: 3.8 g/dL (ref 3.5–4.8)
Alkaline Phosphatase: 76 IU/L (ref 39–117)
BUN/Creatinine Ratio: 13 (ref 11–26)
BUN: 13 mg/dL (ref 8–27)
Bilirubin Total: 0.2 mg/dL (ref 0.0–1.2)
CALCIUM: 9.2 mg/dL (ref 8.7–10.3)
CHLORIDE: 100 mmol/L (ref 97–108)
CO2: 25 mmol/L (ref 18–29)
Creatinine, Ser: 0.98 mg/dL (ref 0.57–1.00)
GFR calc Af Amer: 68 mL/min/{1.73_m2} (ref >59)
GFR, EST NON AFRICAN AMERICAN: 59 mL/min/{1.73_m2} — AB (ref >59)
Globulin, Total: 2.5 g/dL (ref 1.5–4.5)
Glucose: 195 mg/dL — ABNORMAL HIGH (ref 65–99)
POTASSIUM: 3.9 mmol/L (ref 3.5–5.2)
Sodium: 138 mmol/L (ref 134–144)
Total Protein: 6.3 g/dL (ref 6.0–8.5)

## 2015-02-10 LAB — VITAMIN D 25 HYDROXY (VIT D DEFICIENCY, FRACTURES): Vit D, 25-Hydroxy: 17 ng/mL — ABNORMAL LOW (ref 30.0–100.0)

## 2015-02-11 ENCOUNTER — Other Ambulatory Visit: Payer: Self-pay | Admitting: Family

## 2015-02-11 ENCOUNTER — Telehealth: Payer: Self-pay | Admitting: *Deleted

## 2015-02-11 DIAGNOSIS — E559 Vitamin D deficiency, unspecified: Secondary | ICD-10-CM | POA: Insufficient documentation

## 2015-02-11 MED ORDER — VITAMIN D (ERGOCALCIFEROL) 1.25 MG (50000 UNIT) PO CAPS: 50000.0000 [IU] | ORAL_CAPSULE | ORAL | Status: DC

## 2015-02-11 NOTE — Telephone Encounter (Signed)
-----   Message from Sharion Balloon, [place] sent at 02/11/2015 11:13 AM EST ----- Kidney and liver function stable Thyroid levels WNL Vit D levels low-Prescription sent to pharmacy

## 2015-02-16 NOTE — Telephone Encounter (Signed)
Patient aware of results.

## 2015-03-01 DIAGNOSIS — M722 Plantar fascial fibromatosis: Secondary | ICD-10-CM | POA: Diagnosis not present

## 2015-03-22 DIAGNOSIS — M722 Plantar fascial fibromatosis: Secondary | ICD-10-CM | POA: Diagnosis not present

## 2015-04-07 DIAGNOSIS — Z961 Presence of intraocular lens: Secondary | ICD-10-CM | POA: Diagnosis not present

## 2015-04-07 DIAGNOSIS — H4011X1 Primary open-angle glaucoma, mild stage: Secondary | ICD-10-CM | POA: Diagnosis not present

## 2015-04-07 DIAGNOSIS — H4011X3 Primary open-angle glaucoma, severe stage: Secondary | ICD-10-CM | POA: Diagnosis not present

## 2015-06-06 ENCOUNTER — Other Ambulatory Visit: Payer: Self-pay

## 2015-09-29 DIAGNOSIS — M722 Plantar fascial fibromatosis: Secondary | ICD-10-CM | POA: Diagnosis not present

## 2015-10-07 DIAGNOSIS — H401111 Primary open-angle glaucoma, right eye, mild stage: Secondary | ICD-10-CM | POA: Diagnosis not present

## 2015-10-07 DIAGNOSIS — Z961 Presence of intraocular lens: Secondary | ICD-10-CM | POA: Diagnosis not present

## 2015-10-07 DIAGNOSIS — H401123 Primary open-angle glaucoma, left eye, severe stage: Secondary | ICD-10-CM | POA: Diagnosis not present

## 2015-10-10 DIAGNOSIS — Z23 Encounter for immunization: Secondary | ICD-10-CM | POA: Diagnosis not present

## 2015-10-27 DIAGNOSIS — M722 Plantar fascial fibromatosis: Secondary | ICD-10-CM | POA: Diagnosis not present

## 2015-11-02 DIAGNOSIS — H401111 Primary open-angle glaucoma, right eye, mild stage: Secondary | ICD-10-CM | POA: Diagnosis not present

## 2015-11-02 DIAGNOSIS — Z961 Presence of intraocular lens: Secondary | ICD-10-CM | POA: Diagnosis not present

## 2015-11-02 DIAGNOSIS — H401123 Primary open-angle glaucoma, left eye, severe stage: Secondary | ICD-10-CM | POA: Diagnosis not present

## 2015-11-22 DIAGNOSIS — Z6841 Body Mass Index (BMI) 40.0 and over, adult: Secondary | ICD-10-CM | POA: Diagnosis not present

## 2015-11-22 DIAGNOSIS — R351 Nocturia: Secondary | ICD-10-CM | POA: Diagnosis not present

## 2015-11-22 DIAGNOSIS — Z01419 Encounter for gynecological examination (general) (routine) without abnormal findings: Secondary | ICD-10-CM | POA: Diagnosis not present

## 2015-11-22 DIAGNOSIS — N3945 Continuous leakage: Secondary | ICD-10-CM | POA: Diagnosis not present

## 2015-11-22 DIAGNOSIS — N760 Acute vaginitis: Secondary | ICD-10-CM | POA: Diagnosis not present

## 2015-11-30 ENCOUNTER — Encounter: Payer: Self-pay | Admitting: Internal Medicine

## 2015-12-08 DIAGNOSIS — Z803 Family history of malignant neoplasm of breast: Secondary | ICD-10-CM | POA: Diagnosis not present

## 2015-12-08 DIAGNOSIS — Z1231 Encounter for screening mammogram for malignant neoplasm of breast: Secondary | ICD-10-CM | POA: Diagnosis not present

## 2015-12-08 LAB — HM MAMMOGRAPHY

## 2015-12-11 HISTORY — PX: OTHER SURGICAL HISTORY: SHX169

## 2015-12-13 ENCOUNTER — Encounter: Payer: Self-pay | Admitting: Internal Medicine

## 2015-12-13 ENCOUNTER — Encounter: Payer: Self-pay | Admitting: *Deleted

## 2015-12-20 DIAGNOSIS — M722 Plantar fascial fibromatosis: Secondary | ICD-10-CM | POA: Diagnosis not present

## 2015-12-21 DIAGNOSIS — H401111 Primary open-angle glaucoma, right eye, mild stage: Secondary | ICD-10-CM | POA: Diagnosis not present

## 2015-12-21 DIAGNOSIS — H26491 Other secondary cataract, right eye: Secondary | ICD-10-CM | POA: Diagnosis not present

## 2015-12-21 DIAGNOSIS — H401123 Primary open-angle glaucoma, left eye, severe stage: Secondary | ICD-10-CM | POA: Diagnosis not present

## 2015-12-21 DIAGNOSIS — Z961 Presence of intraocular lens: Secondary | ICD-10-CM | POA: Diagnosis not present

## 2015-12-29 DIAGNOSIS — K219 Gastro-esophageal reflux disease without esophagitis: Secondary | ICD-10-CM | POA: Diagnosis not present

## 2015-12-29 DIAGNOSIS — I10 Essential (primary) hypertension: Secondary | ICD-10-CM | POA: Diagnosis not present

## 2015-12-29 DIAGNOSIS — Z79899 Other long term (current) drug therapy: Secondary | ICD-10-CM | POA: Diagnosis not present

## 2015-12-29 DIAGNOSIS — M722 Plantar fascial fibromatosis: Secondary | ICD-10-CM | POA: Diagnosis not present

## 2015-12-29 DIAGNOSIS — M7731 Calcaneal spur, right foot: Secondary | ICD-10-CM | POA: Diagnosis not present

## 2016-02-06 ENCOUNTER — Encounter: Payer: Medicare Other | Admitting: Internal Medicine

## 2016-02-13 ENCOUNTER — Ambulatory Visit: Payer: Medicare Other | Admitting: Pediatrics

## 2016-02-16 ENCOUNTER — Encounter: Payer: Self-pay | Admitting: Pediatrics

## 2016-02-16 ENCOUNTER — Ambulatory Visit (INDEPENDENT_AMBULATORY_CARE_PROVIDER_SITE_OTHER): Payer: Medicare Other | Admitting: Pediatrics

## 2016-02-16 VITALS — BP 121/69 | HR 70 | Temp 97.7°F | Ht 60.0 in | Wt 220.2 lb

## 2016-02-16 DIAGNOSIS — K219 Gastro-esophageal reflux disease without esophagitis: Secondary | ICD-10-CM

## 2016-02-16 DIAGNOSIS — Z780 Asymptomatic menopausal state: Secondary | ICD-10-CM | POA: Diagnosis not present

## 2016-02-16 DIAGNOSIS — Z23 Encounter for immunization: Secondary | ICD-10-CM

## 2016-02-16 DIAGNOSIS — E559 Vitamin D deficiency, unspecified: Secondary | ICD-10-CM | POA: Diagnosis not present

## 2016-02-16 DIAGNOSIS — Z1159 Encounter for screening for other viral diseases: Secondary | ICD-10-CM | POA: Diagnosis not present

## 2016-02-16 DIAGNOSIS — E119 Type 2 diabetes mellitus without complications: Secondary | ICD-10-CM | POA: Diagnosis not present

## 2016-02-16 DIAGNOSIS — Z6841 Body Mass Index (BMI) 40.0 and over, adult: Secondary | ICD-10-CM | POA: Diagnosis not present

## 2016-02-16 DIAGNOSIS — I10 Essential (primary) hypertension: Secondary | ICD-10-CM | POA: Diagnosis not present

## 2016-02-16 LAB — BAYER DCA HB A1C WAIVED: HB A1C: 7 % — AB (ref <7.0)

## 2016-02-16 NOTE — Progress Notes (Signed)
Subjective:    Patient ID: Robin Lindsey, female    DOB: 1946/05/01, 70 y.o.   MRN: [id number]  CC: Follow-up   HPI: Robin Lindsey is a 70 y.o. female presenting for Follow-up  Occasionally has discomfort under her L arm, happens once a week or once a month Feels like a "discomfort", lasts sometimes for several hours, comes on at rest.  L breast has place that gets regular surveillance GER: taking omeprazole regularly Does get SOB with climbing steps but thinks it is because she is out of shape. No Chest pain or discomfort with exertion Had a stress test a couple years ago that was normal   Depression screen [facility] 2/9 02/16/2016 02/09/2015  Decreased Interest 0 0  Down, Depressed, Hopeless 0 0  PHQ - 2 Score 0 0     Relevant past medical, surgical, family and social history reviewed and updated as indicated. Interim medical history since our last visit reviewed. Allergies and medications reviewed and updated.    ROS: Per HPI unless specifically indicated above  History  Smoking status  . Former Smoker -- 1.00 packs/day for 5 years  . Types: Cigarettes  . Start date: 03/10/1962  . Quit date: 03/11/1967  Smokeless tobacco  . Never Used    Comment: smoked for 4-5 years from age 11- 55 years old    Past Medical History Patient Active Problem List   Diagnosis Date Noted  . Vitamin D deficiency 02/11/2015  . HTN (hypertension) 04/23/2013  . GERD (gastroesophageal reflux disease) 04/23/2013  . Personal history of colonic polyps-adenomas 12/09/2012  . Plantar fasciitis 03/28/2011  . Foot pain, left 03/28/2011  . Peroneal tendinitis of left lower extremity 03/28/2011       Objective:    BP 121/69 mmHg  Pulse 70  Temp(Src) 97.7 F (36.5 C) (Oral)  Ht 5' (1.524 m)  Wt 220 lb 3.2 oz (99.882 kg)  BMI 43.00 kg/m2  Wt Readings from Last 3 Encounters:  02/16/16 220 lb 3.2 oz (99.882 kg)  02/09/15 213 lb 12.8 oz (96.979 kg)  06/04/14 209 lb (94.802 kg)     Gen:  NAD, alert, cooperative with exam, NCAT EYES: EOMI, no scleral injection or icterus ENT:  TMs pearly gray b/l, OP without erythema NECK: nl thyroid LYMPH: no cervical LAD CV: NRRR, normal S1/S2, no murmur, distal pulses 2+ b/l.  Resp: CTABL, no wheezes, normal WOB Abd: +BS, soft, NTND. no guarding or organomegaly Ext: No edema, warm Neuro: Alert and oriented, strength equal b/l UE and LE, coordination grossly normal MSK: slightly TTP L sided ribs with palpation     Assessment & Plan:    Aldea was seen today for follow-up multiple med problems.  Diagnoses and all orders for this visit:  Essential hypertension -     BMP8+EGFR -     TSH -     Lipid Panel -     Bayer DCA Hb A1c Waived  Gastroesophageal reflux disease, esophagitis presence not specified -     BMP8+EGFR -     TSH -     Lipid Panel -     Bayer DCA Hb A1c Waived  Vitamin D deficiency -     BMP8+EGFR -     Vitamin D, 25-hydroxy -     TSH -     Lipid Panel -     Bayer DCA Hb A1c Waived  BMI 40.0-44.9, adult (HCC) -     BMP8+EGFR -  TSH -     Lipid Panel -     Bayer DCA Hb A1c Waived  Post-menopausal -     Vitamin D, 25-hydroxy -     DG Bone Density; Future  Need for hepatitis C screening test -     Hepatitis C antibody   Rib pain rest, allergic to NSAIDs, can try OTC topical cream with lidocaine   Follow up plan: Return for tammy for weight loss when available.  Assunta Found, MD [facility] 02/16/2016, 12:05 PM

## 2016-02-17 LAB — BMP8+EGFR
BUN / CREAT RATIO: 14 (ref 11–26)
BUN: 13 mg/dL (ref 8–27)
CHLORIDE: 100 mmol/L (ref 96–106)
CO2: 26 mmol/L (ref 18–29)
Calcium: 9.1 mg/dL (ref 8.7–10.3)
Creatinine, Ser: 0.96 mg/dL (ref 0.57–1.00)
GFR calc Af Amer: 69 mL/min/{1.73_m2} (ref >59)
GFR calc non Af Amer: 60 mL/min/{1.73_m2} (ref >59)
Glucose: 111 mg/dL — ABNORMAL HIGH (ref 65–99)
Potassium: 4.1 mmol/L (ref 3.5–5.2)
SODIUM: 140 mmol/L (ref 134–144)

## 2016-02-17 LAB — HEPATITIS C ANTIBODY: HEP C VIRUS AB: 0.1 {s_co_ratio} (ref 0.0–0.9)

## 2016-02-17 LAB — VITAMIN D 25 HYDROXY (VIT D DEFICIENCY, FRACTURES): VIT D 25 HYDROXY: 17.4 ng/mL — AB (ref 30.0–100.0)

## 2016-02-17 LAB — LIPID PANEL
CHOL/HDL RATIO: 3.1 ratio (ref 0.0–4.4)
Cholesterol, Total: 176 mg/dL (ref 100–199)
HDL: 57 mg/dL (ref >39)
LDL Calculated: 94 mg/dL (ref 0–99)
Triglycerides: 125 mg/dL (ref 0–149)
VLDL Cholesterol Cal: 25 mg/dL (ref 5–40)

## 2016-02-17 LAB — TSH: TSH: 2.91 u[IU]/mL (ref 0.450–4.500)

## 2016-02-22 ENCOUNTER — Telehealth: Payer: Self-pay | Admitting: *Deleted

## 2016-02-22 MED ORDER — PRAVASTATIN SODIUM 20 MG PO TABS: 20.0000 mg | ORAL_TABLET | Freq: Every day | ORAL | Status: DC

## 2016-02-22 MED ORDER — METFORMIN HCL 500 MG PO TABS: 250.0000 mg | ORAL_TABLET | Freq: Two times a day (BID) | ORAL | Status: DC

## 2016-02-22 NOTE — Telephone Encounter (Signed)
Pt with diabetes., hgA1c 7.0. Will start metformin 250mg  BID, pt worried about side effects. Also will start pravastatin 20mg , also worried about side effects from statin.

## 2016-02-22 NOTE — Telephone Encounter (Signed)
Appointment made for Monday at 2:30 with Tammy. Patient aware.

## 2016-02-27 ENCOUNTER — Ambulatory Visit (INDEPENDENT_AMBULATORY_CARE_PROVIDER_SITE_OTHER): Payer: Medicare Other | Admitting: Pharmacist

## 2016-02-27 ENCOUNTER — Encounter: Payer: Self-pay | Admitting: Pharmacist

## 2016-02-27 VITALS — BP 121/68 | HR 67 | Ht 60.0 in | Wt 216.0 lb

## 2016-02-27 DIAGNOSIS — E119 Type 2 diabetes mellitus without complications: Secondary | ICD-10-CM

## 2016-02-27 MED ORDER — METFORMIN HCL ER 500 MG PO TB24: ORAL_TABLET | ORAL | Status: DC

## 2016-02-27 MED ORDER — BAYER MICROLET LANCETS MISC: Status: DC

## 2016-02-27 MED ORDER — GLUCOSE BLOOD VI STRP: ORAL_STRIP | Status: DC

## 2016-02-27 NOTE — Progress Notes (Signed)
Patient ID: GALIANA PALLA, female   DOB: 08-Jul-1946, 70 y.o.   MRN: [id number] Subjective:    Robin Lindsey is a 70 y.o. female who presents for an initial evaluation and education after new diagnosis of Type 2 diabetes mellitus.  Current symptoms/problems include hyperglycemia   FBG was 111 and A1c 7.0% (02/16/2016)  Known diabetic complications: none Cardiovascular risk factors: advanced age (older than 79 for men, 24 for women), diabetes mellitus, dyslipidemia, hypertension, obesity (BMI >= 30 kg/m2) and sedentary lifestyle Current diabetic medications include metformin 500mg  1/2 tablet bid. Patient reports having loose stool since starting metformin  Eye exam current (within one year): no Weight trend: decreasing steadily Prior visit with dietician: no Current diet: in general, an "unhealthy" diet Current exercise: none - due to foot surgery in January 2017  Current monitoring regimen: none Home blood sugar records: none Any episodes of hypoglycemia? no  Is She on ACE inhibitor or angiotensin II receptor blocker?  Yes    valsartan + HCTZ (Diovan HCT)  The following portions of the patient's history were reviewed and updated as appropriate: allergies, current medications, past family history, past medical history, past social history and problem list.   Objective:    BP 121/68 mmHg  Pulse 67  Ht 5' (1.524 m)  Wt 216 lb (97.977 kg)  BMI 42.18 kg/m2   A1c = 7.0% RBG was 141 today    Lab Review GLUCOSE (mg/dL)  Date Value  02/16/2016 111*  02/09/2015 195*  04/21/2014 114*   GLUCOSE, BLD (mg/dL)  Date Value  04/23/2013 115*  02/11/2012 115*   CO2 (mmol/L)  Date Value  02/16/2016 26  02/09/2015 25  04/21/2014 26   BUN (mg/dL)  Date Value  02/16/2016 13  02/09/2015 13  04/21/2014 13  04/23/2013 13  02/11/2012 13   CREAT (mg/dL)  Date Value  04/23/2013 0.92   CREATININE, SER (mg/dL)  Date Value  02/16/2016 0.96  02/09/2015 0.98  04/21/2014 0.89      Assessment:    Diabetes Mellitus type II,  Newly diagnosed Plan:    1.  Rx changes: change metformin to XR 500mg  1 tablet daily to see if better tolerated 2.  Education: Reviewed 'ABCs' of diabetes management (respective goals in parentheses):  A1C (<7), blood pressure (<130/80), and cholesterol (LDL <100). 3.  Discussed CHO counting and serving size recommendations.  Patient to limit CHO to 45 grams per meal and 15 to 20 grams per snack 4.  Patient given Contour Next glucometer.  Taught how to use glucometer.  Discussed fasting and post prandial BG goals. 5.  Follow up: 3 months    Cherre Robins, PharmD, CPP

## 2016-02-27 NOTE — Patient Instructions (Signed)
Diabetes and Standards of Medical Care   Diabetes is complicated. You may find that your diabetes team includes a dietitian, nurse, diabetes educator, eye doctor, and more. To help everyone know what is going on and to help you get the care you deserve, the following schedule of care was developed to help keep you on track. Below are the tests, exams, vaccines, medicines, education, and plans you will need.  Blood Glucose Goals Prior to meals = 80 - 130 Within 2 hours of the start of a meal = less than 180  HbA1c test (goal is less than 7.0% - your last value was %) This test shows how well you have controlled your glucose over the past 2 to 3 months. It is used to see if your diabetes management plan needs to be adjusted.   It is performed at least 2 times a year if you are meeting treatment goals.  It is performed 4 times a year if therapy has changed or if you are not meeting treatment goals.  Blood pressure test  This test is performed at every routine medical visit. The goal is less than 140/90 mmHg for most people, but 130/80 mmHg in some cases. Ask your health care provider about your goal.  Dental exam  Follow up with the dentist regularly.  Eye exam  If you are diagnosed with type 1 diabetes as a child, get an exam upon reaching the age of 10 years or older and have had diabetes for 3 to 5 years. Yearly eye exams are recommended after that initial eye exam.  If you are diagnosed with type 1 diabetes as an adult, get an exam within 5 years of diagnosis and then yearly.  If you are diagnosed with type 2 diabetes, get an exam as soon as possible after the diagnosis and then yearly.  Foot care exam  Visual foot exams are performed at every routine medical visit. The exams check for cuts, injuries, or other problems with the feet.  A comprehensive foot exam should be done yearly. This includes visual inspection as well as assessing foot pulses and testing for loss of  sensation.  Check your feet nightly for cuts, injuries, or other problems with your feet. Tell your health care provider if anything is not healing.  Kidney function test (urine microalbumin)  This test is performed once a year.  Type 1 diabetes: The first test is performed 5 years after diagnosis.  Type 2 diabetes: The first test is performed at the time of diagnosis.  A serum creatinine and estimated glomerular filtration rate (eGFR) test is done once a year to assess the level of chronic kidney disease (CKD), if present.  Lipid profile (cholesterol, HDL, LDL, triglycerides)  Performed every 5 years for most people.  The goal for LDL is less than 100 mg/dL. If you are at high risk, the goal is less than 70 mg/dL.  The goal for HDL is 40 mg/dL to 50 mg/dL for men and 50 mg/dL to 60 mg/dL for women. An HDL cholesterol of 60 mg/dL or higher gives some protection against heart disease.  The goal for triglycerides is less than 150 mg/dL.  Influenza vaccine, pneumococcal vaccine, and hepatitis B vaccine  The influenza vaccine is recommended yearly.  The pneumococcal vaccine is generally given once in a lifetime. However, there are some instances when another vaccination is recommended. Check with your health care provider.  The hepatitis B vaccine is also recommended for adults with diabetes.    Diabetes self-management education  Education is recommended at diagnosis and ongoing as needed.  Treatment plan  Your treatment plan is reviewed at every medical visit.  Document Released: 09/23/2009 Document Revised: 07/29/2013 Document Reviewed: 04/28/2013 ExitCare Patient Information 2014 [employer].   

## 2016-03-07 ENCOUNTER — Encounter: Payer: Self-pay | Admitting: Internal Medicine

## 2016-03-27 DIAGNOSIS — M722 Plantar fascial fibromatosis: Secondary | ICD-10-CM | POA: Diagnosis not present

## 2016-04-02 ENCOUNTER — Other Ambulatory Visit: Payer: Self-pay | Admitting: Family

## 2016-04-04 ENCOUNTER — Ambulatory Visit: Payer: Medicare Other | Attending: Podiatry | Admitting: Physical Therapy

## 2016-04-04 DIAGNOSIS — M79671 Pain in right foot: Secondary | ICD-10-CM

## 2016-04-04 NOTE — Therapy (Signed)
[facility] [place], [place], [address] Phone: [phone]   Fax:  [phone]  Physical Therapy Evaluation  Patient Details  Name: Robin Lindsey MRN: [id number] Date of Birth: 02-Aug-1946 Referring Provider: Steffanie Rainwater  Encounter Date: 04/04/2016      PT End of Session - 04/04/16 1509    Visit Number 1   Number of Visits 12   Date for PT Re-Evaluation 05/30/16   PT Start Time 1031   PT Stop Time 1120   PT Time Calculation (min) 49 min   Activity Tolerance Patient tolerated treatment well   Behavior During Therapy [facility] for tasks assessed/performed      Past Medical History  Diagnosis Date  . Hypertension   . GERD (gastroesophageal reflux disease)   . Arthritis   . 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 Personal history of colonic polyps 12/09/2012  . Diabetes mellitus without complication [facility])     Past Surgical History  Procedure Laterality Date  . Breast lumpectomy  10 + yrs.    left breast  . Cataract extraction w/phaco  02/14/2012    Procedure: CATARACT EXTRACTION PHACO AND INTRAOCULAR LENS PLACEMENT (IOC);  Surgeon: Tonny Branch, MD;  Location: [facility];  Service: Ophthalmology;  Laterality: Left;  CDE 13.73  . Cataract extraction w/phaco  03/03/2012    Procedure: CATARACT EXTRACTION PHACO AND INTRAOCULAR LENS PLACEMENT (IOC);  Surgeon: Tonny Branch, MD;  Location: [facility];  Service: Ophthalmology;  Laterality: Right;  CDE: 16.49  . Colonoscopy    . Percutaneous micrfasciotomy with topaz coblation wand      There were no vitals filed for this visit.       Subjective Assessment - 04/04/16 1255    Subjective I can't walk like I want to because of pain.   Limitations Walking   How long can you walk comfortably? Short distances.   Patient Stated Goals Wear shoes that aren't athlete in nature.   Currently in Pain? Yes   Pain Score 5    Pain Location Foot   Pain Orientation Right   Pain Descriptors /  Indicators Aching;Throbbing   Pain Type Chronic pain   Pain Onset More than a month ago   Pain Frequency Constant   Aggravating Factors  Increased walking and standing.   Pain Relieving Factors Ice.            [facility] PT Assessment - 04/04/16 0001    Assessment   Medical Diagnosis Right plantar fasciitis.   Referring Provider Steffanie Rainwater   Onset Date/Surgical Date --  Many years.   Precautions   Precautions None   Restrictions   Weight Bearing Restrictions No   Balance Screen   Has the patient fallen in the past 6 months No   Has the patient had a decrease in activity level because of a fear of falling?  No   Is the patient reluctant to leave their home because of a fear of falling?  No   Home Ec[employer] residence   Prior Function   Level of Independence Independent   ROM / Strength   AROM / PROM / Strength AROM;Strength   AROM   Overall AROM Comments Full active right ankle range of motion.   Strength   Overall Strength Comments Normal right ankle strength.   Palpation   Palpation comment No palpable tenderness over right heel though patient reports diffuse right plantar surface  pain and lateral foot pain when weight bearing.  She is however, very tender to palpation over right medial Soleus with active trigger points present.   Ambulation/Gait   Gait Pattern Decreased stance time - right;Antalgic                   [facility] Adult PT Treatment/Exercise - 04/04/16 0001    Manual Therapy   Manual Therapy Soft tissue mobilization   Manual therapy comments STW/M to ffected right calf and IASTM to affected right plantar fascia x 15 minutes.                  PT Short Term Goals - 04/04/16 1539    PT SHORT TERM GOAL #1   Title Ind with a HEP.   Time 2   Period Weeks   Status New           PT Long Term Goals - 04/04/16 1551    PT LONG TERM GOAL #1   Title Stand 30 minutes with pain not > 3/10.   Time 8   Period  Weeks   Status New   PT LONG TERM GOAL #2   Title Walk a community distance wiht pain not > 3/10.   Time 8   Period Weeks   Status New               Plan - 04/04/16 1510    Clinical Impression Statement The patient has had ongoing right heel pain for many years.  The pain got so bad that she underwent surgery on 12/29/15 to alleviate her right plantar fascia pain.  She reports the surgery was helpful but she still hurts and she is unable to walk like she would like to due to rising pain-levels.  She is also unable to wear anything other than athletic footwear due to pain.  She rated her pain at a 5/10 today.  She reports that on Easter she was up a lot and and got to a point her pain reached a 10/10 and she was unable to walk.  Her goal is to walk longer without pain and be able to wear different kinds of shoes.   Rehab Potential Good   PT Frequency 2x / week   PT Duration 6 weeks   PT Treatment/Interventions ADLs/Self Care Home Management;Cryotherapy;Electrical Stimulation;Moist Heat;Therapeutic exercise;Therapeutic activities;Ultrasound;Patient/family education;Manual techniques;Vasopneumatic Device   PT Next Visit Plan U/S and STW to affectedright plantar fascia and calf.   Consulted and Agree with Plan of Care Patient      Patient will benefit from skilled therapeutic intervention in order to improve the following deficits and impairments:  Abnormal gait, Decreased activity tolerance, Pain  Visit Diagnosis: Pain in right foot - Plan: PT plan of care cert/re-cert      G-Codes - [id number]    Functional Assessment Tool Used FOTO.Marland KitchenMarland KitchenMarland Kitchen54% limitation.   Functional Limitation Mobility: Walking and moving around   Mobility: Walking and Moving Around Current Status [phone]) At least 40 percent but less than 60 percent impaired, limited or restricted   Mobility: Walking and Moving Around Goal Status [phone]) At least 1 percent but less than 20 percent impaired, limited or restricted        Problem List Patient Active Problem List   Diagnosis Date Noted  . Diabetes mellitus without complication ([place]) 123XX123  . Vitamin D deficiency 02/11/2015  . HTN (hypertension) 04/23/2013  . GERD (gastroesophageal reflux disease) 04/23/2013  . Personal history of colonic polyps-adenomas 12/09/2012  .  Plantar fasciitis 03/28/2011  . Foot pain, left 03/28/2011  . Peroneal tendinitis of left lower extremity 03/28/2011    Milt Coye, [place] MPT 04/04/2016, 3:55 PM  [facility] [place], [place], [address] Phone: [phone]   Fax:  [phone]  Name: CHANTAVIA KRAMARZ MRN: [id number] Date of Birth: [birth year]

## 2016-04-06 ENCOUNTER — Ambulatory Visit: Payer: Medicare Other | Admitting: Physical Therapy

## 2016-04-06 ENCOUNTER — Encounter: Payer: Self-pay | Admitting: Physical Therapy

## 2016-04-06 DIAGNOSIS — M79671 Pain in right foot: Secondary | ICD-10-CM

## 2016-04-06 NOTE — Therapy (Signed)
[facility] [place], [place], [address] Phone: [phone]   Fax:  [phone]  Physical Therapy Treatment  Patient Details  Name: Robin Lindsey MRN: [id number] Date of Birth: [birth year] Referring Provider: Steffanie Rainwater  Encounter Date: 04/06/2016      PT End of Session - 04/06/16 1029    Visit Number 2   Number of Visits 12   Date for PT Re-Evaluation 05/30/16   PT Start Time 1030   PT Stop Time 1111   PT Time Calculation (min) 41 min   Activity Tolerance Patient tolerated treatment well   Behavior During Therapy [facility] for tasks assessed/performed      Past Medical History  Diagnosis Date  . Hypertension   . GERD (gastroesophageal reflux disease)   . Arthritis   . 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 Personal history of colonic polyps 12/09/2012  . Diabetes mellitus without complication [facility])     Past Surgical History  Procedure Laterality Date  . Breast lumpectomy  10 + yrs.    left breast  . Cataract extraction w/phaco  02/14/2012    Procedure: CATARACT EXTRACTION PHACO AND INTRAOCULAR LENS PLACEMENT (IOC);  Surgeon: Tonny Branch, MD;  Location: [facility];  Service: Ophthalmology;  Laterality: Left;  CDE 13.73  . Cataract extraction w/phaco  03/03/2012    Procedure: CATARACT EXTRACTION PHACO AND INTRAOCULAR LENS PLACEMENT (IOC);  Surgeon: Tonny Branch, MD;  Location: [facility];  Service: Ophthalmology;  Laterality: Right;  CDE: 16.49  . Colonoscopy    . Percutaneous micrfasciotomy with topaz coblation wand      There were no vitals filed for this visit.      Subjective Assessment - 04/06/16 1028    Subjective Reports soreness in R foot today. Experiences increased R foot pain 6-7/10 with increased ambulation.   Limitations Walking   How long can you walk comfortably? Short distances.   Patient Stated Goals Wear shoes that aren't athlete in nature.   Currently in Pain? Yes   Pain Score 3    Pain  Location Foot   Pain Orientation Right   Pain Descriptors / Indicators Sore;Aching;Tender   Pain Type Chronic pain   Pain Onset More than a month ago                         [facility] Adult PT Treatment/Exercise - 04/06/16 0001    Modalities   Modalities Ultrasound   Ultrasound   Ultrasound Location R Heel   Ultrasound Parameters 1.0 w/cm2, 100%, 3.3 mhz x10 min   Ultrasound Goals Pain   Manual Therapy   Manual Therapy Myofascial release   Myofascial Release IASTW to R heel/ Achilles Tendon and MFR/TPR to R calf musculature to decrease pain and tightness                  PT Short Term Goals - 04/04/16 1539    PT SHORT TERM GOAL #1   Title Ind with a HEP.   Time 2   Period Weeks   Status New           PT Long Term Goals - 04/04/16 1551    PT LONG TERM GOAL #1   Title Stand 30 minutes with pain not > 3/10.   Time 8   Period Weeks   Status New   PT LONG TERM GOAL #2   Title Walk a community distance  wiht pain not > 3/10.   Time 8   Period Weeks   Status New               Plan - 04/06/16 1121    Clinical Impression Statement Patient tolerated today's treatment well and only reports tenderness and soreness with manual therapy in R calf musculature. Normal [place] response noted following end of the [place] session. Patient tolerated IASTW to R posterior heel and Achilles tendon well with no reports of pain or tenderness. Patient presented with TPs and tightness throughout R calf although tenderness and soreness was experienced mostly in R medial calf musculature. Patient noted that her foot felt better and experienced only soreness following today's treatment.   Rehab Potential Good   PT Frequency 2x / week   PT Duration 6 weeks   PT Treatment/Interventions ADLs/Self Care Home Management;Cryotherapy;Electrical Stimulation;Moist Heat;Therapeutic exercise;Therapeutic activities;Ultrasound;Patient/family education;Manual techniques;Vasopneumatic Device    PT Next Visit Plan U/S and STW to affectedright plantar fascia and calf.   Consulted and Agree with Plan of Care Patient      Patient will benefit from skilled therapeutic intervention in order to improve the following deficits and impairments:  Abnormal gait, Decreased activity tolerance, Pain  Visit Diagnosis: Pain in right foot     Problem List Patient Active Problem List   Diagnosis Date Noted  . Diabetes mellitus without complication ([place]) 123XX123  . Vitamin D deficiency 02/11/2015  . HTN (hypertension) 04/23/2013  . GERD (gastroesophageal reflux disease) 04/23/2013  . Personal history of colonic polyps-adenomas 12/09/2012  . Plantar fasciitis 03/28/2011  . Foot pain, left 03/28/2011  . Peroneal tendinitis of left lower extremity 03/28/2011    Wynelle Fanny, PTA 04/06/2016, 11:25 AM  [facility] [place], [place], [address] Phone: [phone]   Fax:  [phone]  Name: ROBERT DESIRE MRN: [id number] Date of Birth: 09-14-1946

## 2016-04-10 ENCOUNTER — Ambulatory Visit: Payer: Medicare Other | Attending: Podiatry | Admitting: Physical Therapy

## 2016-04-10 ENCOUNTER — Encounter: Payer: Self-pay | Admitting: Physical Therapy

## 2016-04-10 DIAGNOSIS — M79671 Pain in right foot: Secondary | ICD-10-CM | POA: Diagnosis not present

## 2016-04-10 NOTE — Therapy (Signed)
[facility] [place], [place], [address] Phone: [phone]   Fax:  [phone]  Physical Therapy Treatment  Patient Details  Name: Robin Lindsey MRN: [id number] Date of Birth: 10/28/1946 Referring Provider: Steffanie Rainwater  Encounter Date: 04/10/2016      PT End of Session - 04/10/16 1034    Visit Number 3   Number of Visits 12   Date for PT Re-Evaluation 05/30/16   PT Start Time 1036   PT Stop Time 1125   PT Time Calculation (min) 49 min   Activity Tolerance Patient tolerated treatment well   Behavior During Therapy [facility] for tasks assessed/performed      Past Medical History  Diagnosis Date  . Hypertension   . GERD (gastroesophageal reflux disease)   . Arthritis   . 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 Personal history of colonic polyps 12/09/2012  . Diabetes mellitus without complication [facility])     Past Surgical History  Procedure Laterality Date  . Breast lumpectomy  10 + yrs.    left breast  . Cataract extraction w/phaco  02/14/2012    Procedure: CATARACT EXTRACTION PHACO AND INTRAOCULAR LENS PLACEMENT (IOC);  Surgeon: Tonny Branch, MD;  Location: [facility];  Service: Ophthalmology;  Laterality: Left;  CDE 13.73  . Cataract extraction w/phaco  03/03/2012    Procedure: CATARACT EXTRACTION PHACO AND INTRAOCULAR LENS PLACEMENT (IOC);  Surgeon: Tonny Branch, MD;  Location: [facility];  Service: Ophthalmology;  Laterality: Right;  CDE: 16.49  . Colonoscopy    . Percutaneous micrfasciotomy with topaz coblation wand      There were no vitals filed for this visit.      Subjective Assessment - 04/10/16 1033    Subjective Reports that she still has soreness in posterior heel region.    Limitations Walking   How long can you walk comfortably? Short distances.   Patient Stated Goals Wear shoes that aren't athlete in nature.   Currently in Pain? Yes   Pain Score 3    Pain Location Heel   Pain Orientation Right   Pain  Descriptors / Indicators Sore   Pain Type Chronic pain   Pain Onset More than a month ago   Pain Frequency Intermittent            [facility] PT Assessment - 04/10/16 0001    Assessment   Medical Diagnosis Right plantar fasciitis.   Precautions   Precautions None   Restrictions   Weight Bearing Restrictions No                     [facility] Adult PT Treatment/Exercise - 04/10/16 0001    Modalities   Modalities Electrical Stimulation;Moist Heat;Ultrasound   Moist Heat Therapy   Number Minutes Moist Heat 15 Minutes   Moist Heat Location Other (comment)  Calf   Electrical Stimulation   Electrical Stimulation Location R calf/ heel   Electrical Stimulation Action IFC   Electrical Stimulation Parameters 1-10 hz x15 min   Electrical Stimulation Goals Pain   Ultrasound   Ultrasound Location R heel   Ultrasound Parameters 1.2 w/cm2, 50%, 3.3 mhz x10 min   Ultrasound Goals Pain   Manual Therapy   Manual Therapy Myofascial release   Myofascial Release IASTW to R heel/ Achilles Tendon and MFR/TPR to R calf musculature to decrease pain and tightness  PT Short Term Goals - 04/04/16 1539    PT SHORT TERM GOAL #1   Title Ind with a HEP.   Time 2   Period Weeks   Status New           PT Long Term Goals - 04/04/16 1551    PT LONG TERM GOAL #1   Title Stand 30 minutes with pain not > 3/10.   Time 8   Period Weeks   Status New   PT LONG TERM GOAL #2   Title Walk a community distance wiht pain not > 3/10.   Time 8   Period Weeks   Status New               Plan - 04/10/16 1201    Clinical Impression Statement Patient tolerated today's treatment fairly well as she arrived with 3/10 soreness and verbalized tenderness during manual therapy. Patient reports that pain is predominately when walking and she has no pain at rest. Normal [place] response to R heel where soreness is noted with ambulation. Patient tolerated IASTW to R heel region well  with no reports of sensitivity or soreness. Upon palpation of R calf musclature decrease TPs and tightness were noted although TPs continue to be noted in medial R Gastroc. Patient noted tenderness and sensitiivty during TPR/MFR of R medial calf. Normal stimulalation and moist heat response was noted following removal as to decrease soreness and tenderness to upper R calf.    Rehab Potential Good   PT Frequency 2x / week   PT Duration 6 weeks   PT Treatment/Interventions ADLs/Self Care Home Management;Cryotherapy;Electrical Stimulation;Moist Heat;Therapeutic exercise;Therapeutic activities;Ultrasound;Patient/family education;Manual techniques;Vasopneumatic Device   PT Next Visit Plan U/S and STW to affectedright plantar fascia and calf.   Consulted and Agree with Plan of Care Patient      Patient will benefit from skilled therapeutic intervention in order to improve the following deficits and impairments:  Abnormal gait, Decreased activity tolerance, Pain  Visit Diagnosis: Pain in right foot     Problem List Patient Active Problem List   Diagnosis Date Noted  . Diabetes mellitus without complication ([place]) 123XX123  . Vitamin D deficiency 02/11/2015  . HTN (hypertension) 04/23/2013  . GERD (gastroesophageal reflux disease) 04/23/2013  . Personal history of colonic polyps-adenomas 12/09/2012  . Plantar fasciitis 03/28/2011  . Foot pain, left 03/28/2011  . Peroneal tendinitis of left lower extremity 03/28/2011    Wynelle Fanny, PTA 04/10/2016, 12:05 PM  [facility] [place], [place], [address] Phone: [phone]   Fax:  [phone]  Name: Robin Lindsey MRN: [id number] Date of Birth: 29-Mar-1946

## 2016-04-12 ENCOUNTER — Encounter: Payer: Medicare Other | Admitting: Physical Therapy

## 2016-04-13 ENCOUNTER — Ambulatory Visit: Payer: Medicare Other | Admitting: Physical Therapy

## 2016-04-13 DIAGNOSIS — M79671 Pain in right foot: Secondary | ICD-10-CM

## 2016-04-13 NOTE — Therapy (Signed)
[facility] [place], [place], [address] Phone: [phone]   Fax:  [phone]  Physical Therapy Treatment  Patient Details  Name: Robin Lindsey MRN: [id number] Date of Birth: [birth year] Referring Provider: Steffanie Rainwater  Encounter Date: 04/13/2016      PT End of Session - 04/13/16 1328    Visit Number 4   Number of Visits 12   Date for PT Re-Evaluation 05/30/16   PT Start Time 0900   PT Stop Time 0952   PT Time Calculation (min) 52 min      Past Medical History  Diagnosis Date  . Hypertension   . GERD (gastroesophageal reflux disease)   . Arthritis   . 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 Personal history of colonic polyps 12/09/2012  . Diabetes mellitus without complication [facility])     Past Surgical History  Procedure Laterality Date  . Breast lumpectomy  10 + yrs.    left breast  . Cataract extraction w/phaco  02/14/2012    Procedure: CATARACT EXTRACTION PHACO AND INTRAOCULAR LENS PLACEMENT (IOC);  Surgeon: Tonny Branch, MD;  Location: [facility];  Service: Ophthalmology;  Laterality: Left;  CDE 13.73  . Cataract extraction w/phaco  03/03/2012    Procedure: CATARACT EXTRACTION PHACO AND INTRAOCULAR LENS PLACEMENT (IOC);  Surgeon: Tonny Branch, MD;  Location: [facility];  Service: Ophthalmology;  Laterality: Right;  CDE: 16.49  . Colonoscopy    . Percutaneous micrfasciotomy with topaz coblation wand      There were no vitals filed for this visit.      Subjective Assessment - 04/13/16 1318    Subjective The treatments are helping a lot.   Pain Score 2    Pain Location Calf   Pain Orientation Right   Pain Descriptors / Indicators Sore   Pain Type Chronic pain   Pain Onset More than a month ago   Pain Frequency Intermittent                                   PT Short Term Goals - 04/04/16 1539    PT SHORT TERM GOAL #1   Title Ind with a HEP.   Time 2   Period Weeks   Status  New           PT Long Term Goals - 04/04/16 1551    PT LONG TERM GOAL #1   Title Stand 30 minutes with pain not > 3/10.   Time 8   Period Weeks   Status New   PT LONG TERM GOAL #2   Title Walk a community distance wiht pain not > 3/10.   Time 8   Period Weeks   Status New             Patient will benefit from skilled therapeutic intervention in order to improve the following deficits and impairments:     Visit Diagnosis: Pain in right foot     Problem List Patient Active Problem List   Diagnosis Date Noted  . Diabetes mellitus without complication ([place]) 123XX123  . Vitamin D deficiency 02/11/2015  . HTN (hypertension) 04/23/2013  . GERD (gastroesophageal reflux disease) 04/23/2013  . Personal history of colonic polyps-adenomas 12/09/2012  . Plantar fasciitis 03/28/2011  . Foot pain, left 03/28/2011  . Peroneal tendinitis of left lower extremity 03/28/2011    Dominique Calvey, [place] MPT 04/13/2016,  1:29 PM  [facility] [place], [place], [address] Phone: [phone]   Fax:  [phone]  Name: Robin Lindsey MRN: [id number] Date of Birth: 10-15-1946

## 2016-04-13 NOTE — Therapy (Signed)
[facility] [place], [place], [address] Phone: [phone]   Fax:  [phone]  Physical Therapy Treatment  Patient Details  Name: XANDREA POLCE MRN: [id number] Date of Birth: [birth year] Referring Provider: Steffanie Rainwater  Encounter Date: 04/13/2016    Past Medical History  Diagnosis Date  . Hypertension   . GERD (gastroesophageal reflux disease)   . Arthritis   . 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 Personal history of colonic polyps 12/09/2012  . Diabetes mellitus without complication [facility])     Past Surgical History  Procedure Laterality Date  . Breast lumpectomy  10 + yrs.    left breast  . Cataract extraction w/phaco  02/14/2012    Procedure: CATARACT EXTRACTION PHACO AND INTRAOCULAR LENS PLACEMENT (IOC);  Surgeon: Tonny Branch, MD;  Location: [facility];  Service: Ophthalmology;  Laterality: Left;  CDE 13.73  . Cataract extraction w/phaco  03/03/2012    Procedure: CATARACT EXTRACTION PHACO AND INTRAOCULAR LENS PLACEMENT (IOC);  Surgeon: Tonny Branch, MD;  Location: [facility];  Service: Ophthalmology;  Laterality: Right;  CDE: 16.49  . Colonoscopy    . Percutaneous micrfasciotomy with topaz coblation wand      There were no vitals filed for this visit.      Subjective Assessment - 04/13/16 1318    Subjective The treatments are helping a lot.   Pain Score 2    Pain Location Calf   Pain Orientation Right   Pain Descriptors / Indicators Sore   Pain Type Chronic pain   Pain Onset More than a month ago   Pain Frequency Intermittent                                   PT Short Term Goals - 04/04/16 1539    PT SHORT TERM GOAL #1   Title Ind with a HEP.   Time 2   Period Weeks   Status New           PT Long Term Goals - 04/04/16 1551    PT LONG TERM GOAL #1   Title Stand 30 minutes with pain not > 3/10.   Time 8   Period Weeks   Status New   PT LONG TERM GOAL #2   Title Walk  a community distance wiht pain not > 3/10.   Time 8   Period Weeks   Status New             Patient will benefit from skilled therapeutic intervention in order to improve the following deficits and impairments:     Visit Diagnosis: Pain in right foot     Problem List Patient Active Problem List   Diagnosis Date Noted  . Diabetes mellitus without complication ([place]) 123XX123  . Vitamin D deficiency 02/11/2015  . HTN (hypertension) 04/23/2013  . GERD (gastroesophageal reflux disease) 04/23/2013  . Personal history of colonic polyps-adenomas 12/09/2012  . Plantar fasciitis 03/28/2011  . Foot pain, left 03/28/2011  . Peroneal tendinitis of left lower extremity 03/28/2011   Treatment:  U/S at 1.50 W/CM2 x 14 minutes to affected left calf/Achilles f/b STW/M including IASTM to affected left calf; Achilles and heel x 25 minutes. Lynzee Lindquist, [place] MPT 04/13/2016, 1:23 PM  [facility] [address] [place], [place], [address] Phone: [phone]   Fax:  [phone]  Name: TYMESHA HUHN  MRN: [id number] Date of Birth: Jun 05, 1946

## 2016-04-16 ENCOUNTER — Ambulatory Visit: Payer: Medicare Other | Admitting: Physical Therapy

## 2016-04-16 ENCOUNTER — Encounter: Payer: Self-pay | Admitting: Physical Therapy

## 2016-04-16 DIAGNOSIS — M79671 Pain in right foot: Secondary | ICD-10-CM | POA: Diagnosis not present

## 2016-04-16 NOTE — Therapy (Signed)
[facility] [place], [place], [address] Phone: [phone]   Fax:  [phone]  Physical Therapy Treatment  Patient Details  Name: Robin Lindsey MRN: [id number] Date of Birth: 05/24/1946 Referring Provider: Steffanie Rainwater  Encounter Date: 04/16/2016      PT End of Session - 04/16/16 0932    Visit Number 5   Number of Visits 12   Date for PT Re-Evaluation 05/30/16   PT Start Time 0914   PT Stop Time 0959   PT Time Calculation (min) 45 min   Activity Tolerance Patient tolerated treatment well   Behavior During Therapy [facility] for tasks assessed/performed      Past Medical History  Diagnosis Date  . Hypertension   . GERD (gastroesophageal reflux disease)   . Arthritis   . 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 Personal history of colonic polyps 12/09/2012  . Diabetes mellitus without complication [facility])     Past Surgical History  Procedure Laterality Date  . Breast lumpectomy  10 + yrs.    left breast  . Cataract extraction w/phaco  02/14/2012    Procedure: CATARACT EXTRACTION PHACO AND INTRAOCULAR LENS PLACEMENT (IOC);  Surgeon: Tonny Branch, MD;  Location: [facility];  Service: Ophthalmology;  Laterality: Left;  CDE 13.73  . Cataract extraction w/phaco  03/03/2012    Procedure: CATARACT EXTRACTION PHACO AND INTRAOCULAR LENS PLACEMENT (IOC);  Surgeon: Tonny Branch, MD;  Location: [facility];  Service: Ophthalmology;  Laterality: Right;  CDE: 16.49  . Colonoscopy    . Percutaneous micrfasciotomy with topaz coblation wand      There were no vitals filed for this visit.      Subjective Assessment - 04/16/16 0914    Subjective Patient reported some pain after standing a lot over weekend   Limitations Walking   How long can you walk comfortably? Short distances.   Patient Stated Goals Wear shoes that aren't athlete in nature.   Currently in Pain? Yes   Pain Score 3    Pain Location Foot  calf   Pain Orientation Right   Pain Descriptors / Indicators Sore;Sharp   Pain Type Chronic pain   Pain Onset More than a month ago   Pain Frequency Intermittent   Aggravating Factors  any weight bearing activity   Pain Relieving Factors rest                         [facility] Adult PT Treatment/Exercise - 04/16/16 0001    Moist Heat Therapy   Number Minutes Moist Heat 15 Minutes   Moist Heat Location --  calf    Electrical Stimulation   Electrical Stimulation Location R calf   Electrical Stimulation Action IFC   Electrical Stimulation Parameters 1-10hz    Electrical Stimulation Goals Pain   Ultrasound   Ultrasound Location right heel   Ultrasound Parameters 1.2w/cm2/50%/3.10mz x121m   Ultrasound Goals Pain   Manual Therapy   Manual Therapy Soft tissue mobilization   Myofascial Release IASTW to R heel/ Achilles Tendon and MFR/TPR to R calf musculature to decrease pain and tightness                PT Education - 04/16/16 0932    Education provided Yes   Education Details HEP   Person(s) Educated Patient   Methods Explanation;Demonstration;Handout   Comprehension Verbalized understanding;Returned demonstration          PT Short  Term Goals - 04/16/16 0946    PT SHORT TERM GOAL #1   Title Ind with a HEP.   Time 2   Period Weeks   Status Achieved           PT Long Term Goals - 04/16/16 0946    PT LONG TERM GOAL #1   Title Stand 30 minutes with pain not > 3/10.   Time 8   Period Weeks   Status On-going  4+/10 04/16/16   PT LONG TERM GOAL #2   Title Walk a community distance wiht pain not > 3/10.   Time 8   Period Weeks   Status On-going  4+/10 04/16/16               Plan - 04/16/16 0949    Clinical Impression Statement Patient is progressing with good response to treatments thus far. Patient has most pain with any weight bearing activity and relief with rest. Educated patient on comfortable shoes with possible inserts, self stretches for calf and plantar fascia.  HEP given today. Patient met STG #1 today, LTG's ongoing due to pain deficit.    Rehab Potential Good   PT Frequency 2x / week   PT Duration 6 weeks   PT Treatment/Interventions ADLs/Self Care Home Management;Cryotherapy;Electrical Stimulation;Moist Heat;Therapeutic exercise;Therapeutic activities;Ultrasound;Patient/family education;Manual techniques;Vasopneumatic Device   PT Next Visit Plan U/S and STW to affectedright plantar fascia and calf.   Consulted and Agree with Plan of Care Patient      Patient will benefit from skilled therapeutic intervention in order to improve the following deficits and impairments:  Abnormal gait, Decreased activity tolerance, Pain  Visit Diagnosis: Pain in right foot     Problem List Patient Active Problem List   Diagnosis Date Noted  . Diabetes mellitus without complication ([place]) 60/03/5996  . Vitamin D deficiency 02/11/2015  . HTN (hypertension) 04/23/2013  . GERD (gastroesophageal reflux disease) 04/23/2013  . Personal history of colonic polyps-adenomas 12/09/2012  . Plantar fasciitis 03/28/2011  . Foot pain, left 03/28/2011  . Peroneal tendinitis of left lower extremity 03/28/2011    Phillips Climes, PTA 04/16/2016, 10:01 AM  [facility] [place], [place], [address] Phone: [phone]   Fax:  [phone]  Name: Robin Lindsey MRN: [id number] Date of Birth: June 13, 1946

## 2016-04-16 NOTE — Patient Instructions (Signed)
PROM: Ankle Plantar / Dorsiflexion    Gently grasp right foot and bend foot and ankle up also grab big toe and stretch arch. Hold each position __30__ seconds. Repeat _5___ times per set. Do __1-2__ sets per session. Do __2-3__ sessions per day. Have someone else move foot.  http://orth.exer.us/62   Copyright  VHI. All rights reserved.  Stretching: Calf - Towel    Sit with knee straight and towel looped around left foot. Gently pull on towel until stretch is felt in calf. Hold __30__ seconds. Repeat __2-5__ times per set. Do __1-2__ sets per session. Do __2-3__ sessions per day.  http://orth.exer.us/706   Copyright  VHI. All rights reserved.

## 2016-04-17 ENCOUNTER — Ambulatory Visit (AMBULATORY_SURGERY_CENTER): Payer: Self-pay

## 2016-04-17 VITALS — Ht 60.0 in | Wt 208.0 lb

## 2016-04-17 DIAGNOSIS — Z800 Family history of malignant neoplasm of digestive organs: Secondary | ICD-10-CM

## 2016-04-17 DIAGNOSIS — Z86010 Personal history of colonic polyps: Secondary | ICD-10-CM

## 2016-04-17 NOTE — Progress Notes (Signed)
Per pt, no allergies to soy or egg products.Pt not taking any weight loss meds or using  O2 at home. 

## 2016-04-20 ENCOUNTER — Encounter: Payer: Self-pay | Admitting: Physical Therapy

## 2016-04-20 ENCOUNTER — Ambulatory Visit: Payer: Medicare Other | Admitting: Physical Therapy

## 2016-04-20 DIAGNOSIS — M79671 Pain in right foot: Secondary | ICD-10-CM | POA: Diagnosis not present

## 2016-04-20 NOTE — Therapy (Signed)
[facility] [place], [place], [address] Phone: [phone]   Fax:  [phone]  Physical Therapy Treatment  Patient Details  Name: Robin Lindsey MRN: [id number] Date of Birth: [birth year] Referring Provider: Steffanie Rainwater  Encounter Date: 04/20/2016      PT End of Session - 04/20/16 1115    Visit Number 6   Number of Visits 12   Date for PT Re-Evaluation 05/30/16   PT Start Time [id number]   PT Stop Time 1205   PT Time Calculation (min) 42 min   Activity Tolerance Patient tolerated treatment well   Behavior During Therapy [facility] for tasks assessed/performed      Past Medical History  Diagnosis Date  . Hypertension   . GERD (gastroesophageal reflux disease)   . Arthritis   . 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 Personal history of colonic polyps 12/09/2012  . Diabetes mellitus without complication ([place])   . Heel pain     right heel pain  . Plantar fasciitis     right foot    Past Surgical History  Procedure Laterality Date  . Breast lumpectomy  10 + yrs.    left breast/benign  . Cataract extraction w/phaco  02/14/2012    Procedure: CATARACT EXTRACTION PHACO AND INTRAOCULAR LENS PLACEMENT (IOC);  Surgeon: Tonny Branch, MD;  Location: [facility];  Service: Ophthalmology;  Laterality: Left;  CDE 13.73  . Cataract extraction w/phaco  03/03/2012    Procedure: CATARACT EXTRACTION PHACO AND INTRAOCULAR LENS PLACEMENT (IOC);  Surgeon: Tonny Branch, MD;  Location: [facility];  Service: Ophthalmology;  Laterality: Right;  CDE: 16.49  . Colonoscopy    . Percutaneous micrfasciotomy with topaz coblation wand      There were no vitals filed for this visit.      Subjective Assessment - 04/20/16 1115    Subjective Patient states that she has had more pain since last treatment and no progress noted.   Limitations Walking   How long can you walk comfortably? Short distances.   Patient Stated Goals Wear shoes that aren't athlete in  nature.   Currently in Pain? Yes   Pain Score 4    Pain Location Foot   Pain Orientation Right   Pain Descriptors / Indicators Sore   Pain Type Chronic pain   Pain Onset More than a month ago            [facility] PT Assessment - 04/20/16 0001    Assessment   Medical Diagnosis Right plantar fasciitis.   Precautions   Precautions None   Restrictions   Weight Bearing Restrictions No                     [facility] Adult PT Treatment/Exercise - 04/20/16 0001    Modalities   Modalities Electrical Stimulation;Moist Heat;Ultrasound   Moist Heat Therapy   Number Minutes Moist Heat 15 Minutes   Moist Heat Location Ankle   Electrical Stimulation   Electrical Stimulation Location R lateral ankle/ medial calf   Electrical Stimulation Action Pre-Mod   Electrical Stimulation Parameters 80-150 hz x15 min   Electrical Stimulation Goals Pain   Ultrasound   Ultrasound Location R PF/ Heel   Ultrasound Parameters 1.0 w/cm2, 100%, 3.3 mhz x10 min   Ultrasound Goals Pain   Manual Therapy   Manual Therapy Myofascial release   Myofascial Release IASTW to R Heel/ PF/ Achilles Tendon to decrease pain and discomfort  PT Short Term Goals - 04/16/16 0946    PT SHORT TERM GOAL #1   Title Ind with a HEP.   Time 2   Period Weeks   Status Achieved           PT Long Term Goals - 04/16/16 0946    PT LONG TERM GOAL #1   Title Stand 30 minutes with pain not > 3/10.   Time 8   Period Weeks   Status On-going  4+/10 04/16/16   PT LONG TERM GOAL #2   Title Walk a community distance wiht pain not > 3/10.   Time 8   Period Weeks   Status On-going  4+/10 04/16/16               Plan - 04/20/16 1222    Clinical Impression Statement Patient tolerated today's treatment well today although she arrived with increased pain and has not seen improvement since last treatment on 04/16/2016. Patient experienced increased discomfort with IASTW to the last R heel and  into Achilles Tendon. Patient's R Achilles Tendon did present with tightness especially in the lateral aspect of the tendon. 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 Normal modalties response noted following removal of the modalities. Patient unable to meet any goals today during treatment secondary to the pain.    Rehab Potential Good   PT Frequency 2x / week   PT Duration 6 weeks   PT Treatment/Interventions ADLs/Self Care Home Management;Cryotherapy;Electrical Stimulation;Moist Heat;Therapeutic exercise;Therapeutic activities;Ultrasound;Patient/family education;Manual techniques;Vasopneumatic Device   PT Next Visit Plan Continue with modalties and IASTW with focus on lateral heel and Achilles Tendon per patient report.   Consulted and Agree with Plan of Care Patient      Patient will benefit from skilled therapeutic intervention in order to improve the following deficits and impairments:  Abnormal gait, Decreased activity tolerance, Pain  Visit Diagnosis: Pain in right foot     Problem List Patient Active Problem List   Diagnosis Date Noted  . Diabetes mellitus without complication ([place]) 123XX123  . Vitamin D deficiency 02/11/2015  . HTN (hypertension) 04/23/2013  . GERD (gastroesophageal reflux disease) 04/23/2013  . Personal history of colonic polyps-adenomas 12/09/2012  . Plantar fasciitis 03/28/2011  . Foot pain, left 03/28/2011  . Peroneal tendinitis of left lower extremity 03/28/2011    Wynelle Fanny, PTA 04/20/2016, 12:32 PM  [facility] [place], [place], [address] Phone: [phone]   Fax:  [phone]  Name: Robin Lindsey MRN: [id number] Date of Birth: [birth year]

## 2016-04-23 ENCOUNTER — Encounter: Payer: Self-pay | Admitting: Physical Therapy

## 2016-04-23 ENCOUNTER — Ambulatory Visit: Payer: Medicare Other | Admitting: Physical Therapy

## 2016-04-23 DIAGNOSIS — M79671 Pain in right foot: Secondary | ICD-10-CM | POA: Diagnosis not present

## 2016-04-23 NOTE — Therapy (Signed)
[facility] [place], [place], [address] Phone: [phone]   Fax:  [phone]  Physical Therapy Treatment  Patient Details  Name: Robin Lindsey MRN: [id number] Date of Birth: Jul 02, 1946 Referring Provider: Steffanie Rainwater  Encounter Date: 04/23/2016      PT End of Session - 04/23/16 1122    Visit Number 7   Number of Visits 12   Date for PT Re-Evaluation 05/30/16   PT Start Time 1116   PT Stop Time 1200   PT Time Calculation (min) 44 min   Activity Tolerance Patient tolerated treatment well   Behavior During Therapy [facility] for tasks assessed/performed      Past Medical History  Diagnosis Date  . Hypertension   . GERD (gastroesophageal reflux disease)   . Arthritis   . 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 Personal history of colonic polyps 12/09/2012  . Diabetes mellitus without complication ([place])   . Heel pain     right heel pain  . Plantar fasciitis     right foot    Past Surgical History  Procedure Laterality Date  . Breast lumpectomy  10 + yrs.    left breast/benign  . Cataract extraction w/phaco  02/14/2012    Procedure: CATARACT EXTRACTION PHACO AND INTRAOCULAR LENS PLACEMENT (IOC);  Surgeon: Tonny Branch, MD;  Location: [facility];  Service: Ophthalmology;  Laterality: Left;  CDE 13.73  . Cataract extraction w/phaco  03/03/2012    Procedure: CATARACT EXTRACTION PHACO AND INTRAOCULAR LENS PLACEMENT (IOC);  Surgeon: Tonny Branch, MD;  Location: [facility];  Service: Ophthalmology;  Laterality: Right;  CDE: 16.49  . Colonoscopy    . Percutaneous micrfasciotomy with topaz coblation wand      There were no vitals filed for this visit.      Subjective Assessment - 04/23/16 1120    Subjective patient reported progress overall   Limitations Walking   How long can you walk comfortably? Short distances.   Patient Stated Goals Wear shoes that aren't athlete in nature.   Currently in Pain? Yes   Pain Score 2    Pain Location Foot   Pain Orientation Right   Pain Descriptors / Indicators Sore   Pain Type Chronic pain   Pain Onset More than a month ago   Pain Frequency Intermittent   Aggravating Factors  weight bearing   Pain Relieving Factors rest                         [facility] Adult PT Treatment/Exercise - 04/23/16 0001    Moist Heat Therapy   Number Minutes Moist Heat 15 Minutes   Moist Heat Location Ankle   Electrical Stimulation   Electrical Stimulation Location R lateral ankle/ medial calf   Electrical Stimulation Action premod   Electrical Stimulation Parameters 80-150hz    Electrical Stimulation Goals Pain   Ultrasound   Ultrasound Location right plantar fascia   Ultrasound Parameters 1.0w/cm2/50%/3.6mzx10min   Ultrasound Goals Pain   Manual Therapy   Manual Therapy Myofascial release   Myofascial Release IASTW to R Heel/ PF/ Achilles Tendon to decrease pain and discomfort                   PT Short Term Goals - 04/16/16 0946    PT SHORT TERM GOAL #1   Title Ind with a HEP.   Time 2   Period Weeks   Status Achieved  PT Long Term Goals - 04/23/16 1123    PT LONG TERM GOAL #1   Title Stand 30 minutes with pain not > 3/10.   Time 8   Period Weeks   Status --  4+/10 04/23/16   PT LONG TERM GOAL #2   Title Walk a community distance wiht pain not > 3/10.   Time 8   Period Weeks   Status Achieved  3/10 04/23/16               Plan - 04/23/16 1124    Clinical Impression Statement patient progressing with reports of improvement overall and is able to walk a community distance with 3/10 pain at most. Patient still has 4+/10 pain with prolong standing. Patient reported doing self stretches daily. Patient met LTG #2 today other goal ongoing due to pain with standing.    Rehab Potential Good   PT Frequency 2x / week   PT Duration 6 weeks   PT Treatment/Interventions ADLs/Self Care Home Management;Cryotherapy;Electrical  Stimulation;Moist Heat;Therapeutic exercise;Therapeutic activities;Ultrasound;Patient/family education;Manual techniques;Vasopneumatic Device   PT Next Visit Plan Continue with modalties and IASTW with focus on lateral heel and Achilles Tendon per patient report.   Consulted and Agree with Plan of Care Patient      Patient will benefit from skilled therapeutic intervention in order to improve the following deficits and impairments:  Abnormal gait, Decreased activity tolerance, Pain  Visit Diagnosis: Pain in right foot     Problem List Patient Active Problem List   Diagnosis Date Noted  . Diabetes mellitus without complication ([place]) 28/76/8115  . Vitamin D deficiency 02/11/2015  . HTN (hypertension) 04/23/2013  . GERD (gastroesophageal reflux disease) 04/23/2013  . Personal history of colonic polyps-adenomas 12/09/2012  . Plantar fasciitis 03/28/2011  . Foot pain, left 03/28/2011  . Peroneal tendinitis of left lower extremity 03/28/2011    Phillips Climes, PTA 04/23/2016, 12:08 PM  [facility] [place], [place], [address] Phone: [phone]   Fax:  [phone]  Name: Robin Lindsey MRN: [id number] Date of Birth: 20-Jan-1946

## 2016-04-24 ENCOUNTER — Telehealth: Payer: Self-pay | Admitting: Pharmacist

## 2016-04-25 NOTE — Telephone Encounter (Signed)
Patient reports BG has been well controlled. Advised to hold metformin and continue to follow low CHO diet.   She has follow up appt 05/2016 - will decide if need to start alternative pharmacotherapy at that time

## 2016-04-26 ENCOUNTER — Encounter: Payer: Self-pay | Admitting: Physical Therapy

## 2016-04-26 ENCOUNTER — Ambulatory Visit: Payer: Medicare Other | Admitting: Physical Therapy

## 2016-04-26 DIAGNOSIS — M79671 Pain in right foot: Secondary | ICD-10-CM | POA: Diagnosis not present

## 2016-04-26 NOTE — Therapy (Signed)
[facility] [place], [place], [address] Phone: [phone]   Fax:  [phone]  Physical Therapy Treatment  Patient Details  Name: Robin Lindsey MRN: [id number] Date of Birth: 19-Feb-1946 Referring Provider: Steffanie Rainwater  Encounter Date: 04/26/2016      PT End of Session - 04/26/16 1120    Visit Number 8   Number of Visits 12   Date for PT Re-Evaluation 05/30/16   PT Start Time 1120   PT Stop Time [id number]   PT Time Calculation (min) 44 min      Past Medical History  Diagnosis Date  . Hypertension   . GERD (gastroesophageal reflux disease)   . Arthritis   . 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 Personal history of colonic polyps 12/09/2012  . Diabetes mellitus without complication ([place])   . Heel pain     right heel pain  . Plantar fasciitis     right foot    Past Surgical History  Procedure Laterality Date  . Breast lumpectomy  10 + yrs.    left breast/benign  . Cataract extraction w/phaco  02/14/2012    Procedure: CATARACT EXTRACTION PHACO AND INTRAOCULAR LENS PLACEMENT (IOC);  Surgeon: Tonny Branch, MD;  Location: [facility];  Service: Ophthalmology;  Laterality: Left;  CDE 13.73  . Cataract extraction w/phaco  03/03/2012    Procedure: CATARACT EXTRACTION PHACO AND INTRAOCULAR LENS PLACEMENT (IOC);  Surgeon: Tonny Branch, MD;  Location: [facility];  Service: Ophthalmology;  Laterality: Right;  CDE: 16.49  . Colonoscopy    . Percutaneous micrfasciotomy with topaz coblation wand      There were no vitals filed for this visit.      Subjective Assessment - 04/26/16 1120    Subjective Reports that she sees improvment.   Limitations Walking   How long can you walk comfortably? Short distances.   Patient Stated Goals Wear shoes that aren't athlete in nature.   Currently in Pain? No/denies            [facility] PT Assessment - 04/26/16 0001    Assessment   Medical Diagnosis Right plantar fasciitis.   Precautions    Precautions None   Restrictions   Weight Bearing Restrictions No                     [facility] Adult PT Treatment/Exercise - 04/26/16 0001    Modalities   Modalities Electrical Stimulation;Moist Heat;Ultrasound   Moist Heat Therapy   Number Minutes Moist Heat 15 Minutes   Moist Heat Location Ankle   Electrical Stimulation   Electrical Stimulation Location R lateral ankle/ medial calf   Electrical Stimulation Action Pre-Mod   Electrical Stimulation Parameters 80-150 Hz x15 min   Electrical Stimulation Goals Pain   Ultrasound   Ultrasound Location R lateral heel/ lateral Achilles Tendon   Ultrasound Parameters 1.5 w/cm2, 100%, 3.3 mhz x10 min   Ultrasound Goals Pain   Manual Therapy   Manual Therapy Myofascial release   Myofascial Release IASTW to R lateral Heel/ Achilles Tendon to decrease pain and discomfort                   PT Short Term Goals - 04/16/16 0946    PT SHORT TERM GOAL #1   Title Ind with a HEP.   Time 2   Period Weeks   Status Achieved  PT Long Term Goals - 04/23/16 1123    PT LONG TERM GOAL #1   Title Stand 30 minutes with pain not > 3/10.   Time 8   Period Weeks   Status --  4+/10 04/23/16   PT LONG TERM GOAL #2   Title Walk a community distance wiht pain not > 3/10.   Time 8   Period Weeks   Status Achieved  3/10 04/23/16               Plan - 04/26/16 1152    Clinical Impression Statement Patient presented today with reports of improvement regarding pain since previous week. R lateral  Achilles Tendon had decreased tightness present as was observed during 04/20/2016 session. Normal modalities response noted following removal of the modalities. Patient did not report any increased pain or discomfort with any aspect of today's treatment. Patient denied pain following today's treatment.   Rehab Potential Good   PT Frequency 2x / week   PT Duration 6 weeks   PT Treatment/Interventions ADLs/Self Care Home  Management;Cryotherapy;Electrical Stimulation;Moist Heat;Therapeutic exercise;Therapeutic activities;Ultrasound;Patient/family education;Manual techniques;Vasopneumatic Device   PT Next Visit Plan Continue with modalties and IASTW with focus on lateral heel and Achilles Tendon per patient report. Consider initating exercises next treatment.   Consulted and Agree with Plan of Care Patient      Patient will benefit from skilled therapeutic intervention in order to improve the following deficits and impairments:  Abnormal gait, Decreased activity tolerance, Pain  Visit Diagnosis: Pain in right foot     Problem List Patient Active Problem List   Diagnosis Date Noted  . Diabetes mellitus without complication ([place]) 123XX123  . Vitamin D deficiency 02/11/2015  . HTN (hypertension) 04/23/2013  . GERD (gastroesophageal reflux disease) 04/23/2013  . Personal history of colonic polyps-adenomas 12/09/2012  . Plantar fasciitis 03/28/2011  . Foot pain, left 03/28/2011  . Peroneal tendinitis of left lower extremity 03/28/2011    Wynelle Fanny, PTA 04/26/2016, 12:06 PM  [facility] [place], [place], [address] Phone: [phone]   Fax:  [phone]  Name: Robin Lindsey MRN: [id number] Date of Birth: 1946-06-24

## 2016-04-27 DIAGNOSIS — E119 Type 2 diabetes mellitus without complications: Secondary | ICD-10-CM | POA: Diagnosis not present

## 2016-04-27 DIAGNOSIS — H401123 Primary open-angle glaucoma, left eye, severe stage: Secondary | ICD-10-CM | POA: Diagnosis not present

## 2016-04-27 DIAGNOSIS — H401111 Primary open-angle glaucoma, right eye, mild stage: Secondary | ICD-10-CM | POA: Diagnosis not present

## 2016-04-27 DIAGNOSIS — Z961 Presence of intraocular lens: Secondary | ICD-10-CM | POA: Diagnosis not present

## 2016-04-27 LAB — HM DIABETES EYE EXAM

## 2016-04-30 ENCOUNTER — Encounter: Payer: Self-pay | Admitting: Physical Therapy

## 2016-04-30 ENCOUNTER — Ambulatory Visit: Payer: Medicare Other | Admitting: Physical Therapy

## 2016-04-30 DIAGNOSIS — M79671 Pain in right foot: Secondary | ICD-10-CM | POA: Diagnosis not present

## 2016-04-30 NOTE — Therapy (Signed)
[facility] [place], [place], [address] Phone: [phone]   Fax:  [phone]  Physical Therapy Treatment  Patient Details  Name: Robin Lindsey MRN: [id number] Date of Birth: May 05, 1946 Referring Provider: Steffanie Rainwater  Encounter Date: 04/30/2016      PT End of Session - 04/30/16 1115    Visit Number 9   Number of Visits 12   Date for PT Re-Evaluation 05/30/16   PT Start Time [id number]   PT Stop Time 1204   PT Time Calculation (min) 43 min   Activity Tolerance Patient tolerated treatment well   Behavior During Therapy [facility] for tasks assessed/performed      Past Medical History  Diagnosis Date  . Hypertension   . GERD (gastroesophageal reflux disease)   . Arthritis   . 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 Personal history of colonic polyps 12/09/2012  . Diabetes mellitus without complication ([place])   . Heel pain     right heel pain  . Plantar fasciitis     right foot    Past Surgical History  Procedure Laterality Date  . Breast lumpectomy  10 + yrs.    left breast/benign  . Cataract extraction w/phaco  02/14/2012    Procedure: CATARACT EXTRACTION PHACO AND INTRAOCULAR LENS PLACEMENT (IOC);  Surgeon: Tonny Branch, MD;  Location: [facility];  Service: Ophthalmology;  Laterality: Left;  CDE 13.73  . Cataract extraction w/phaco  03/03/2012    Procedure: CATARACT EXTRACTION PHACO AND INTRAOCULAR LENS PLACEMENT (IOC);  Surgeon: Tonny Branch, MD;  Location: [facility];  Service: Ophthalmology;  Laterality: Right;  CDE: 16.49  . Colonoscopy    . Percutaneous micrfasciotomy with topaz coblation wand      There were no vitals filed for this visit.      Subjective Assessment - 04/30/16 1115    Subjective Reports that long walking continues to hurt but other than that her foot is okay.    Limitations Walking   How long can you walk comfortably? Short distances.   Patient Stated Goals Wear shoes that aren't athlete in  nature.   Currently in Pain? No/denies            [facility] PT Assessment - 04/30/16 0001    Assessment   Medical Diagnosis Right plantar fasciitis.   Precautions   Precautions None   Restrictions   Weight Bearing Restrictions No                     [facility] Adult PT Treatment/Exercise - 04/30/16 0001    Modalities   Modalities Electrical Stimulation;Moist Heat;Ultrasound   Moist Heat Therapy   Number Minutes Moist Heat 15 Minutes   Moist Heat Location Ankle   Electrical Stimulation   Electrical Stimulation Location R lateral ankle/ medial calf   Electrical Stimulation Action Pre-Mod   Electrical Stimulation Parameters 80-150 Hz x15 min   Electrical Stimulation Goals Pain   Ultrasound   Ultrasound Location R lateral ankle, Achilles Tendon   Ultrasound Parameters 1.2 w/cm2, 100%, 3.3 mhz x10 min   Ultrasound Goals Pain   Manual Therapy   Manual Therapy Myofascial release   Myofascial Release IASTW to R lateral Heel/ Achilles Tendon to further decrease discomfort that is affecting ambulation                  PT Short Term Goals - 04/16/16 0946    PT SHORT TERM GOAL #1  Title Ind with a HEP.   Time 2   Period Weeks   Status Achieved           PT Long Term Goals - 04/30/16 1120    PT LONG TERM GOAL #1   Title Stand 30 minutes with pain not > 3/10.   Time 8   Period Weeks   Status Achieved   PT LONG TERM GOAL #2   Title Walk a community distance wiht pain not > 3/10.   Time 8   Period Weeks   Status Achieved  3/10 04/23/16               Plan - 04/30/16 1201    Clinical Impression Statement Patient presented in clinic with no R ankle/foot pain today only reporting that prolonged activity and ambulation is what increases pain. Minimal tightness noted in R Achilles Tendon upon palpation and IASTW today. Patient reported no pain or sensitiivty with IASTW to R lateral ankle and Achilles Tendon. Patient has now achieved all goals set at  evaluation. Normal modalities response noted following removal of the modalities. Patient denied pain following today's treatment.   Rehab Potential Good   PT Frequency 2x / week   PT Duration 6 weeks   PT Treatment/Interventions ADLs/Self Care Home Management;Cryotherapy;Electrical Stimulation;Moist Heat;Therapeutic exercise;Therapeutic activities;Ultrasound;Patient/family education;Manual techniques;Vasopneumatic Device   PT Next Visit Plan Continue with modalties and IASTW with focus on lateral heel and Achilles Tendon per patient report. Consider initating exercises next treatment.   Consulted and Agree with Plan of Care Patient      Patient will benefit from skilled therapeutic intervention in order to improve the following deficits and impairments:  Abnormal gait, Decreased activity tolerance, Pain  Visit Diagnosis: Pain in right foot     Problem List Patient Active Problem List   Diagnosis Date Noted  . Diabetes mellitus without complication ([place]) 123XX123  . Vitamin D deficiency 02/11/2015  . HTN (hypertension) 04/23/2013  . GERD (gastroesophageal reflux disease) 04/23/2013  . Personal history of colonic polyps-adenomas 12/09/2012  . Plantar fasciitis 03/28/2011  . Foot pain, left 03/28/2011  . Peroneal tendinitis of left lower extremity 03/28/2011    Wynelle Fanny, PTA 04/30/2016, 12:07 PM  [facility] [place], [place], [address] Phone: [phone]   Fax:  [phone]  Name: GREER AUGUSTA MRN: [id number] Date of Birth: 02-27-1946

## 2016-05-02 ENCOUNTER — Encounter: Payer: Self-pay | Admitting: Internal Medicine

## 2016-05-02 ENCOUNTER — Ambulatory Visit (AMBULATORY_SURGERY_CENTER): Payer: Medicare Other | Admitting: Internal Medicine

## 2016-05-02 VITALS — BP 116/64 | HR 67 | Temp 98.4°F | Resp 16 | Ht 60.0 in | Wt 208.0 lb

## 2016-05-02 DIAGNOSIS — Z86010 Personal history of colonic polyps: Secondary | ICD-10-CM | POA: Diagnosis not present

## 2016-05-02 DIAGNOSIS — D128 Benign neoplasm of rectum: Secondary | ICD-10-CM

## 2016-05-02 DIAGNOSIS — Z800 Family history of malignant neoplasm of digestive organs: Secondary | ICD-10-CM | POA: Diagnosis not present

## 2016-05-02 DIAGNOSIS — D123 Benign neoplasm of transverse colon: Secondary | ICD-10-CM | POA: Diagnosis not present

## 2016-05-02 DIAGNOSIS — Z1211 Encounter for screening for malignant neoplasm of colon: Secondary | ICD-10-CM | POA: Diagnosis not present

## 2016-05-02 DIAGNOSIS — E119 Type 2 diabetes mellitus without complications: Secondary | ICD-10-CM | POA: Diagnosis not present

## 2016-05-02 MED ORDER — SODIUM CHLORIDE 0.9 % IV SOLN: 500.0000 mL | INTRAVENOUS | Status: DC

## 2016-05-02 NOTE — Op Note (Signed)
[facility] Patient Name: Robin Lindsey Procedure Date: 05/02/2016 9:18 AM MRN: [id number] Endoscopist: Gatha Mayer , MD Age: 70 Referring MD:  Date of Birth: 09/24/1946 Gender: Female Procedure:                Colonoscopy Indications:              Surveillance: Personal history of adenomatous                            polyps on last colonoscopy > 5 years ago, Family                            history of colon cancer in multiple second-degree                            relatives Medicines:                Propofol per Anesthesia, Monitored Anesthesia Care Procedure:                Pre-Anesthesia Assessment:                           - Prior to the procedure, a History and Physical                            was performed, and patient medications and                            allergies were reviewed. The patient's tolerance of                            previous anesthesia was also reviewed. The risks                            and benefits of the procedure and the sedation                            options and risks were discussed with the patient.                            All questions were answered, and informed consent                            was obtained. Prior Anticoagulants: The patient has                            taken no previous anticoagulant or antiplatelet                            agents. ASA Grade Assessment: II - A patient with                            mild systemic disease. After reviewing the risks  and benefits, the patient was deemed in                            satisfactory condition to undergo the procedure.                           After obtaining informed consent, the colonoscope                            was passed under direct vision. Throughout the                            procedure, the patient's blood pressure, pulse, and                            oxygen saturations were monitored continuously. The                          Model PCF-H190L [id number]) scope was introduced                            through the anus and advanced to the the cecum,                            identified by appendiceal orifice and ileocecal                            valve. The colonoscopy was performed without                            difficulty. The patient tolerated the procedure                            well. The quality of the bowel preparation was                            excellent. The bowel preparation used was Miralax.                            The ileocecal valve, appendiceal orifice, and                            rectum were photographed. Scope In: 9:22:46 AM Scope Out: 9:40:06 AM Scope Withdrawal Time: 0 hours 14 minutes 14 seconds  Total Procedure Duration: 0 hours 17 minutes 20 seconds  Findings:                 The perianal and digital rectal examinations were                            normal.                           Two flat and sessile polyps were found in the  rectum and distal transverse colon. The polyps were                            4 to 5 mm in size. These polyps were removed with a                            cold snare. Resection and retrieval were complete.                            Verification of patient identification for the                            specimen was done. Estimated blood loss was minimal.                           The exam was otherwise without abnormality on                            direct and retroflexion views. Complications:            No immediate complications. Estimated Blood Loss:     Estimated blood loss was minimal. Impression:               - Two 4 to 5 mm polyps in the rectum and in the                            distal transverse colon, removed with a cold snare.                            Resected and retrieved.                           - The examination was otherwise normal on direct                             and retroflexion views. Recommendation:           - Patient has a contact number available for                            emergencies. The signs and symptoms of potential                            delayed complications were discussed with the                            patient. Return to normal activities tomorrow.                            Written discharge instructions were provided to the                            patient.                           -  Resume previous diet.                           - Continue present medications.                           - Repeat colonoscopy is recommended for                            surveillance. The colonoscopy date will be                            determined after pathology results from today's                            exam become available for review. Gatha Mayer, MD 05/02/2016 9:46:37 AM This report has been signed electronically.

## 2016-05-02 NOTE — Patient Instructions (Addendum)
I found and removed 2 small polyps. I will let you know pathology results and when to have another routine colonoscopy by mail. Probably 2022  I appreciate the opportunity to care for you. Gatha Mayer, MD, FACG  YOU HAD AN ENDOSCOPIC PROCEDURE TODAY AT [facility]:   Refer to the procedure report that was given to you for any specific questions about what was found during the examination.  If the procedure report does not answer your questions, please call your gastroenterologist to clarify.  If you requested that your care partner not be given the details of your procedure findings, then the procedure report has been included in a sealed envelope for you to review at your convenience later.  YOU SHOULD EXPECT: Some feelings of bloating in the abdomen. Passage of more gas than usual.  Walking can help get rid of the air that was put into your GI tract during the procedure and reduce the bloating. If you had a lower endoscopy (such as a colonoscopy or flexible sigmoidoscopy) you may notice spotting of blood in your stool or on the toilet paper. If you underwent a bowel prep for your procedure, you may not have a normal bowel movement for a few days.  Please Note:  You might notice some irritation and congestion in your nose or some drainage.  This is from the oxygen used during your procedure.  There is no need for concern and it should clear up in a day or so.  SYMPTOMS TO REPORT IMMEDIATELY:   Following lower endoscopy (colonoscopy or flexible sigmoidoscopy):  Excessive amounts of blood in the stool  Significant tenderness or worsening of abdominal pains  Swelling of the abdomen that is new, acute  Fever of 100F or higher   For urgent or emergent issues, a gastroenterologist can be reached at any hour by calling [phone].   DIET: Your first meal following the procedure should be a small meal and then it is ok to progress to your normal diet. Heavy or  fried foods are harder to digest and may make you feel nauseous or bloated.  Likewise, meals heavy in dairy and vegetables can increase bloating.  Drink plenty of fluids but you should avoid alcoholic beverages for 24 hours.  ACTIVITY:  You should plan to take it easy for the rest of today and you should NOT DRIVE or use heavy machinery until tomorrow (because of the sedation medicines used during the test).    FOLLOW UP: Our staff will call the number listed on your records the next business day following your procedure to check on you and address any questions or concerns that you may have regarding the information given to you following your procedure. If we do not reach you, we will leave a message.  However, if you are feeling well and you are not experiencing any problems, there is no need to return our call.  We will assume that you have returned to your regular daily activities without incident.  If any biopsies were taken you will be contacted by phone or by letter within the next 1-3 weeks.  Please call us at [phone] if you have not heard about the biopsies in 3 weeks.    SIGNATURES/CONFIDENTIALITY: You and/or your care partner have signed paperwork which will be entered into your electronic medical record.  These signatures attest to the fact that that the information above on your After Visit Summary has been reviewed and is understood.  Full responsibility of the confidentiality of this discharge information lies with you and/or your care-partner.

## 2016-05-02 NOTE — Progress Notes (Signed)
A/ox3 pleased with MAC, report to Karen RN 

## 2016-05-02 NOTE — Progress Notes (Signed)
Called to room to assist during endoscopic procedure.  Patient ID and intended procedure confirmed with present staff. Received instructions for my participation in the procedure from the performing physician.  

## 2016-05-03 ENCOUNTER — Telehealth: Payer: Self-pay | Admitting: *Deleted

## 2016-05-03 NOTE — Telephone Encounter (Signed)
  Follow up Call-  Call back number 05/02/2016  Post procedure Call Back phone  # [phone]  Permission to leave phone message Yes     Patient questions:  Do you have a fever, pain , or abdominal swelling? No. Pain Score  0 *  Have you tolerated food without any problems? Yes.    Have you been able to return to your normal activities? Yes.    Do you have any questions about your discharge instructions: Diet   No. Medications  No. Follow up visit  No.  Do you have questions or concerns about your Care? No.  Actions: * If pain score is 4 or above: No action needed, pain <4.

## 2016-05-04 ENCOUNTER — Ambulatory Visit: Payer: Medicare Other | Admitting: Physical Therapy

## 2016-05-04 DIAGNOSIS — M79671 Pain in right foot: Secondary | ICD-10-CM

## 2016-05-04 NOTE — Therapy (Signed)
[facility] [place], [place], [address] Phone: [phone]   Fax:  [phone]  Physical Therapy Treatment  Patient Details  Name: Robin Lindsey MRN: [id number] Date of Birth: 09-05-1946 Referring Provider: Steffanie Rainwater  Encounter Date: 05/04/2016    Past Medical History  Diagnosis Date  . Hypertension   . GERD (gastroesophageal reflux disease)   . Arthritis   . 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 Personal history of colonic polyps 12/09/2012  . Diabetes mellitus without complication ([place])   . Heel pain     right heel pain  . Plantar fasciitis     right foot    Past Surgical History  Procedure Laterality Date  . Breast lumpectomy  10 + yrs.    left breast/benign  . Cataract extraction w/phaco  02/14/2012    Procedure: CATARACT EXTRACTION PHACO AND INTRAOCULAR LENS PLACEMENT (IOC);  Surgeon: Tonny Branch, MD;  Location: [facility];  Service: Ophthalmology;  Laterality: Left;  CDE 13.73  . Cataract extraction w/phaco  03/03/2012    Procedure: CATARACT EXTRACTION PHACO AND INTRAOCULAR LENS PLACEMENT (IOC);  Surgeon: Tonny Branch, MD;  Location: [facility];  Service: Ophthalmology;  Laterality: Right;  CDE: 16.49  . Colonoscopy    . Percutaneous micrfasciotomy with topaz coblation wand      There were no vitals filed for this visit.      Subjective Assessment - 05/04/16 1240    Subjective I'm 75% better.   Limitations Walking   How long can you walk comfortably? Short distances.   Patient Stated Goals Wear shoes that aren't athlete in nature.                                   PT Short Term Goals - 04/16/16 0946    PT SHORT TERM GOAL #1   Title Ind with a HEP.   Time 2   Period Weeks   Status Achieved           PT Long Term Goals - 04/30/16 1120    PT LONG TERM GOAL #1   Title Stand 30 minutes with pain not > 3/10.   Time 8   Period Weeks   Status Achieved   PT LONG TERM  GOAL #2   Title Walk a community distance wiht pain not > 3/10.   Time 8   Period Weeks   Status Achieved  3/10 04/23/16             Patient will benefit from skilled therapeutic intervention in order to improve the following deficits and impairments:  Abnormal gait, Decreased activity tolerance, Pain  Visit Diagnosis: Pain in right foot     Problem List Patient Active Problem List   Diagnosis Date Noted  . Diabetes mellitus without complication ([place]) 123XX123  . Vitamin D deficiency 02/11/2015  . HTN (hypertension) 04/23/2013  . GERD (gastroesophageal reflux disease) 04/23/2013  . Personal history of colonic polyps-adenomas 12/09/2012  . Plantar fasciitis 03/28/2011  . Foot pain, left 03/28/2011  . Peroneal tendinitis of left lower extremity 03/28/2011  Treatment:  In prone:  U/S at 1.50 W/CM2 x 12 minutes to patient affected right calf and Achilles f/b STW/M including IASTM including affected plantar surface of right heel x 26 minutes.  Patient responded very well today.  Robin Lindsey, [place] MPT 05/04/2016, 12:41 PM  [facility] [place], [place], [address] Phone: [phone]   Fax:  [phone]  Name: Robin Lindsey MRN: [id number] Date of Birth: 05/27/1946

## 2016-05-08 ENCOUNTER — Encounter: Payer: Self-pay | Admitting: Physical Therapy

## 2016-05-08 ENCOUNTER — Ambulatory Visit: Payer: Medicare Other | Admitting: Physical Therapy

## 2016-05-08 DIAGNOSIS — M79671 Pain in right foot: Secondary | ICD-10-CM | POA: Diagnosis not present

## 2016-05-08 NOTE — Therapy (Addendum)
[facility] [place], [place], [address] Phone: [phone]   Fax:  [phone]  Physical Therapy Treatment  Patient Details  Name: Robin Lindsey MRN: [id number] Date of Birth: [birth year] Referring Provider: Steffanie Rainwater  Encounter Date: 05/08/2016      PT End of Session - 05/08/16 1114    Visit Number 10   Number of Visits 12   Date for PT Re-Evaluation 05/30/16   PT Start Time 1116   PT Stop Time 1200   PT Time Calculation (min) 44 min   Activity Tolerance Patient tolerated treatment well   Behavior During Therapy [facility] for tasks assessed/performed      Past Medical History  Diagnosis Date  . Hypertension   . GERD (gastroesophageal reflux disease)   . Arthritis   . 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 Personal history of colonic polyps 12/09/2012  . Diabetes mellitus without complication ([place])   . Heel pain     right heel pain  . Plantar fasciitis     right foot    Past Surgical History  Procedure Laterality Date  . Breast lumpectomy  10 + yrs.    left breast/benign  . Cataract extraction w/phaco  02/14/2012    Procedure: CATARACT EXTRACTION PHACO AND INTRAOCULAR LENS PLACEMENT (IOC);  Surgeon: Tonny Branch, MD;  Location: [facility];  Service: Ophthalmology;  Laterality: Left;  CDE 13.73  . Cataract extraction w/phaco  03/03/2012    Procedure: CATARACT EXTRACTION PHACO AND INTRAOCULAR LENS PLACEMENT (IOC);  Surgeon: Tonny Branch, MD;  Location: [facility];  Service: Ophthalmology;  Laterality: Right;  CDE: 16.49  . Colonoscopy    . Percutaneous micrfasciotomy with topaz coblation wand      There were no vitals filed for this visit.      Subjective Assessment - 05/08/16 1114    Subjective Continues to report that prolonged walking will flare up pain.   Limitations Walking   How long can you walk comfortably? Short distances.   Patient Stated Goals Wear shoes that aren't athlete in nature.   Currently in  Pain? No/denies            [facility] PT Assessment - 05/08/16 0001    Assessment   Medical Diagnosis Right plantar fasciitis.   Precautions   Precautions None   Restrictions   Weight Bearing Restrictions No                     [facility] Adult PT Treatment/Exercise - 05/08/16 0001    Exercises   Exercises Ankle   Modalities   Modalities Electrical Stimulation;Moist Heat   Moist Heat Therapy   Number Minutes Moist Heat 15 Minutes   Moist Heat Location Ankle   Electrical Stimulation   Electrical Stimulation Location R Achilles Tendon   Electrical Stimulation Action Pre-Mod   Electrical Stimulation Parameters 80-150 hz x15 min   Electrical Stimulation Goals Tone   Manual Therapy   Manual Therapy Myofascial release   Myofascial Release IASTW to R lateral Heel/ Achilles Tendon to further decrease discomfort that is affecting ambulation with TPR in distal calf to decrease minimal TPs   Ankle Exercises: Standing   Rocker Board Other (comment)  x6 min   Heel Raises 20 reps   Toe Raise 20 reps                  PT Short Term Goals - 04/16/16 [id number]  PT SHORT TERM GOAL #1   Title Ind with a HEP.   Time 2   Period Weeks   Status Achieved           PT Long Term Goals - 04/30/16 1120    PT LONG TERM GOAL #1   Title Stand 30 minutes with pain not > 3/10.   Time 8   Period Weeks   Status Achieved   PT LONG TERM GOAL #2   Title Walk a community distance wiht pain not > 3/10.   Time 8   Period Weeks   Status Achieved  3/10 04/23/16               Plan - 05/08/16 1151    Clinical Impression Statement Patient continues to progress in regards to PT treatments and tolerated therapeutic exercises well with no reports of increased pain or discomfort. Minimal increased tightness noted in R Achilles Tendon today with minimal TPs noted in distal R calf musculature upon palpation. Patient experienced soreness upon TPR at distal calf muscle-tendon junction  per patient report. Patient has achieved all goals set at evaluation and states that next treatment will be her last. Normal modalties response noted following removal of the modalities to decrease tightness noted in treated musculature.   Rehab Potential Good   PT Frequency 2x / week   PT Duration 6 weeks   PT Treatment/Interventions ADLs/Self Care Home Management;Cryotherapy;Electrical Stimulation;Moist Heat;Therapeutic exercise;Therapeutic activities;Ultrasound;Patient/family education;Manual techniques;Vasopneumatic Device   PT Next Visit Plan Continue with exercise with IASTW if required next treatment. Patient states that next treatment will be her last treatment.   Consulted and Agree with Plan of Care Patient      Patient will benefit from skilled therapeutic intervention in order to improve the following deficits and impairments:  Abnormal gait, Decreased activity tolerance, Pain  Visit Diagnosis: Pain in right foot     Problem List Patient Active Problem List   Diagnosis Date Noted  . Diabetes mellitus without complication ([place]) 123XX123  . Vitamin D deficiency 02/11/2015  . HTN (hypertension) 04/23/2013  . GERD (gastroesophageal reflux disease) 04/23/2013  . Personal history of colonic polyps-adenomas 12/09/2012  . Plantar fasciitis 03/28/2011  . Foot pain, left 03/28/2011  . Peroneal tendinitis of left lower extremity 03/28/2011    Wynelle Fanny, PTA 05/08/2016, 12:48 PM  [facility] [place], [place], [address] Phone: [phone]   Fax:  [phone]  Name: Robin Lindsey MRN: [id number] Date of Birth: 04/23/1946

## 2016-05-09 ENCOUNTER — Encounter: Payer: Self-pay | Admitting: Internal Medicine

## 2016-05-09 NOTE — Progress Notes (Signed)
Quick Note:  2 small adenomas recall 2022 ______

## 2016-05-11 ENCOUNTER — Encounter: Payer: Self-pay | Admitting: Physical Therapy

## 2016-05-11 ENCOUNTER — Ambulatory Visit: Payer: Medicare Other | Attending: Podiatry | Admitting: Physical Therapy

## 2016-05-11 DIAGNOSIS — M79671 Pain in right foot: Secondary | ICD-10-CM | POA: Insufficient documentation

## 2016-05-11 NOTE — Therapy (Signed)
[facility] [place], [place], [address] Phone: [phone]   Fax:  [phone]  Physical Therapy Treatment  Patient Details  Name: Robin Lindsey MRN: [id number] Date of Birth: [birth year] Referring Provider: Steffanie Rainwater  Encounter Date: 05/11/2016      PT End of Session - 05/11/16 1116    Visit Number 11   Number of Visits 12   Date for PT Re-Evaluation 05/30/16   PT Start Time 1114   PT Stop Time 1159   PT Time Calculation (min) 45 min   Activity Tolerance Patient tolerated treatment well   Behavior During Therapy [facility] for tasks assessed/performed      Past Medical History  Diagnosis Date  . Hypertension   . GERD (gastroesophageal reflux disease)   . Arthritis   . 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 Personal history of colonic polyps 12/09/2012  . Diabetes mellitus without complication ([place])   . Heel pain     right heel pain  . Plantar fasciitis     right foot    Past Surgical History  Procedure Laterality Date  . Breast lumpectomy  10 + yrs.    left breast/benign  . Cataract extraction w/phaco  02/14/2012    Procedure: CATARACT EXTRACTION PHACO AND INTRAOCULAR LENS PLACEMENT (IOC);  Surgeon: Tonny Branch, MD;  Location: [facility];  Service: Ophthalmology;  Laterality: Left;  CDE 13.73  . Cataract extraction w/phaco  03/03/2012    Procedure: CATARACT EXTRACTION PHACO AND INTRAOCULAR LENS PLACEMENT (IOC);  Surgeon: Tonny Branch, MD;  Location: [facility];  Service: Ophthalmology;  Laterality: Right;  CDE: 16.49  . Colonoscopy    . Percutaneous micrfasciotomy with topaz coblation wand      There were no vitals filed for this visit.      Subjective Assessment - 05/11/16 1115    Subjective Reports that her foot is "pretty good" and reports that washed some windows yesterday and has some soreness but not much.   Limitations Walking   How long can you walk comfortably? Short distances.   Patient Stated Goals  Wear shoes that aren't athlete in nature.   Currently in Pain? Yes   Pain Score 2    Pain Location Foot   Pain Orientation Right   Pain Descriptors / Indicators Sore   Pain Type Chronic pain   Pain Onset More than a month ago            [facility] PT Assessment - 05/11/16 0001    Assessment   Medical Diagnosis Right plantar fasciitis.   Precautions   Precautions None   Restrictions   Weight Bearing Restrictions No                     [facility] Adult PT Treatment/Exercise - 05/11/16 0001    Manual Therapy   Manual Therapy Myofascial release   Myofascial Release MFR/STW to R Achilles Tendon, Gastrocneumius, Plantar Fascia to decrease soreness and minimal tightness present   Ankle Exercises: Standing   Rocker Board 5 minutes   Heel Raises 20 reps   Toe Raise 20 reps   Other Standing Ankle Exercises R forward step 6" step x20 reps 2 HH assist                  PT Short Term Goals - 04/16/16 0946    PT SHORT TERM GOAL #1   Title Ind with a HEP.  Time 2   Period Weeks   Status Achieved           PT Long Term Goals - 04/30/16 1120    PT LONG TERM GOAL #1   Title Stand 30 minutes with pain not > 3/10.   Time 8   Period Weeks   Status Achieved   PT LONG TERM GOAL #2   Title Walk a community distance wiht pain not > 3/10.   Time 8   Period Weeks   Status Achieved  3/10 04/23/16               Plan - 05/11/16 1209    Clinical Impression Statement Patient has progressed very well during PT and reports that her R foot/ankle pain is much better. Reports being able to stand longer than she was previously as well as increased walking time. Patient able to tolerate standing therapeutic exercises well with only reports of soreness with rockerboard. Patient had minimal tightness/soreness with manual therapy today in R distal gastrocneumius upon palpation and patient report. Patient has achieved all goals set at evakuation at this time. Patient denied  pain at end of treatment.   Rehab Potential Good   PT Frequency 2x / week   PT Duration 6 weeks   PT Treatment/Interventions ADLs/Self Care Home Management;Cryotherapy;Electrical Stimulation;Moist Heat;Therapeutic exercise;Therapeutic activities;Ultrasound;Patient/family education;Manual techniques;Vasopneumatic Device   PT Next Visit Plan Communicate to MPT of D/C summary required.   Consulted and Agree with Plan of Care Patient      Patient will benefit from skilled therapeutic intervention in order to improve the following deficits and impairments:  Abnormal gait, Decreased activity tolerance, Pain  Visit Diagnosis: Pain in right foot     Problem List Patient Active Problem List   Diagnosis Date Noted  . Diabetes mellitus without complication ([place]) 68/11/7516  . Vitamin D deficiency 02/11/2015  . HTN (hypertension) 04/23/2013  . GERD (gastroesophageal reflux disease) 04/23/2013  . Personal history of colonic polyps-adenomas 12/09/2012  . Plantar fasciitis 03/28/2011  . Foot pain, left 03/28/2011  . Peroneal tendinitis of left lower extremity 03/28/2011  PHYSICAL THERAPY DISCHARGE SUMMARY  Visits from Start of Care: 1  Current functional level related to goals / functional outcomes: Please see above.   Remaining deficits: Al goals met.   Education / Equipment: HEP.  Plan: Patient agrees to discharge.  Patient goals were met. Patient is being discharged due to meeting the stated rehab goals.  ?????       Behr Cislo, [place] MPT 05/11/2016, 1:00 PM  [facility] [address] [place], [place], [address] Phone: [phone]   Fax:  [phone]  Name: Robin Lindsey MRN: [id number] Date of Birth: 23-Jan-1946

## 2016-05-11 NOTE — Therapy (Signed)
[facility] [place], [place], [address] Phone: [phone]   Fax:  [phone]  Physical Therapy Treatment  Patient Details  Name: Robin Lindsey MRN: [id number] Date of Birth: [birth year] Referring Provider: Steffanie Rainwater  Encounter Date: 05/11/2016      PT End of Session - 05/11/16 1116    Visit Number 11   Number of Visits 12   Date for PT Re-Evaluation 05/30/16   PT Start Time 1114   PT Stop Time 1159   PT Time Calculation (min) 45 min   Activity Tolerance Patient tolerated treatment well   Behavior During Therapy [facility] for tasks assessed/performed      Past Medical History  Diagnosis Date  . Hypertension   . GERD (gastroesophageal reflux disease)   . Arthritis   . 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 Personal history of colonic polyps 12/09/2012  . Diabetes mellitus without complication ([place])   . Heel pain     right heel pain  . Plantar fasciitis     right foot    Past Surgical History  Procedure Laterality Date  . Breast lumpectomy  10 + yrs.    left breast/benign  . Cataract extraction w/phaco  02/14/2012    Procedure: CATARACT EXTRACTION PHACO AND INTRAOCULAR LENS PLACEMENT (IOC);  Surgeon: Tonny Branch, MD;  Location: [facility];  Service: Ophthalmology;  Laterality: Left;  CDE 13.73  . Cataract extraction w/phaco  03/03/2012    Procedure: CATARACT EXTRACTION PHACO AND INTRAOCULAR LENS PLACEMENT (IOC);  Surgeon: Tonny Branch, MD;  Location: [facility];  Service: Ophthalmology;  Laterality: Right;  CDE: 16.49  . Colonoscopy    . Percutaneous micrfasciotomy with topaz coblation wand      There were no vitals filed for this visit.      Subjective Assessment - 05/11/16 1115    Subjective Reports that her foot is "pretty good" and reports that washed some windows yesterday and has some soreness but not much.   Limitations Walking   How long can you walk comfortably? Short distances.   Patient Stated Goals  Wear shoes that aren't athlete in nature.   Currently in Pain? Yes   Pain Score 2    Pain Location Foot   Pain Orientation Right   Pain Descriptors / Indicators Sore   Pain Type Chronic pain   Pain Onset More than a month ago            [facility] PT Assessment - 05/11/16 0001    Assessment   Medical Diagnosis Right plantar fasciitis.   Precautions   Precautions None   Restrictions   Weight Bearing Restrictions No                     [facility] Adult PT Treatment/Exercise - 05/11/16 0001    Manual Therapy   Manual Therapy Myofascial release   Myofascial Release MFR/STW to R Achilles Tendon, Gastrocneumius, Plantar Fascia to decrease soreness and minimal tightness present   Ankle Exercises: Standing   Rocker Board 5 minutes   Heel Raises 20 reps   Toe Raise 20 reps   Other Standing Ankle Exercises R forward step 6" step x20 reps 2 HH assist                  PT Short Term Goals - 04/16/16 0946    PT SHORT TERM GOAL #1   Title Ind with a HEP.  Time 2   Period Weeks   Status Achieved           PT Long Term Goals - 04/30/16 1120    PT LONG TERM GOAL #1   Title Stand 30 minutes with pain not > 3/10.   Time 8   Period Weeks   Status Achieved   PT LONG TERM GOAL #2   Title Walk a community distance wiht pain not > 3/10.   Time 8   Period Weeks   Status Achieved  3/10 04/23/16               Plan - 05/11/16 1209    Clinical Impression Statement Patient has progressed very well during PT and reports that her R foot/ankle pain is much better. Reports being able to stand longer than she was previously as well as increased walking time. Patient able to tolerate standing therapeutic exercises well with only reports of soreness with rockerboard. Patient had minimal tightness/soreness with manual therapy today in R distal gastrocneumius upon palpation and patient report. Patient has achieved all goals set at evakuation at this time. Patient denied  pain at end of treatment.   Rehab Potential Good   PT Frequency 2x / week   PT Duration 6 weeks   PT Treatment/Interventions ADLs/Self Care Home Management;Cryotherapy;Electrical Stimulation;Moist Heat;Therapeutic exercise;Therapeutic activities;Ultrasound;Patient/family education;Manual techniques;Vasopneumatic Device   PT Next Visit Plan Communicate to MPT of D/C summary required.   Consulted and Agree with Plan of Care Patient      Patient will benefit from skilled therapeutic intervention in order to improve the following deficits and impairments:  Abnormal gait, Decreased activity tolerance, Pain  Visit Diagnosis: Pain in right foot     Problem List Patient Active Problem List   Diagnosis Date Noted  . Diabetes mellitus without complication ([place]) 123XX123  . Vitamin D deficiency 02/11/2015  . HTN (hypertension) 04/23/2013  . GERD (gastroesophageal reflux disease) 04/23/2013  . Personal history of colonic polyps-adenomas 12/09/2012  . Plantar fasciitis 03/28/2011  . Foot pain, left 03/28/2011  . Peroneal tendinitis of left lower extremity 03/28/2011   Ahmed Prima, PTA 05/11/2016 12:14 PM  [facility] [place], [place], [address] Phone: [phone]   Fax:  [phone]  Name: Robin Lindsey MRN: [id number] Date of Birth: 1946/06/10

## 2016-05-21 ENCOUNTER — Ambulatory Visit (INDEPENDENT_AMBULATORY_CARE_PROVIDER_SITE_OTHER): Payer: Medicare Other | Admitting: Pharmacist

## 2016-05-21 ENCOUNTER — Encounter: Payer: Self-pay | Admitting: Pharmacist

## 2016-05-21 VITALS — BP 122/68 | HR 72 | Ht 60.0 in | Wt 202.0 lb

## 2016-05-21 DIAGNOSIS — E119 Type 2 diabetes mellitus without complications: Secondary | ICD-10-CM | POA: Diagnosis not present

## 2016-05-21 DIAGNOSIS — Z0000 Encounter for general adult medical examination without abnormal findings: Secondary | ICD-10-CM | POA: Diagnosis not present

## 2016-05-21 LAB — BAYER DCA HB A1C WAIVED: HB A1C (BAYER DCA - WAIVED): 6.5 % (ref <7.0)

## 2016-05-21 NOTE — Progress Notes (Signed)
Patient ID: Robin Lindsey, female   DOB: 1946-01-18, 70 y.o.   MRN: [id number]    Subjective:   Robin Lindsey is a 70 y.o. female who presents for an Initial Medicare Annual Wellness Visit and to recheck type 2 DM.  She had tried metformin for diabetes but has lots of nausea and diarrhea.  She stopped and symptoms resolved.   HBG readings range - 90-154 14 day avg = 122  Review of Systems  Review of Systems  Constitutional: Positive for weight loss (intentional - has lost about 14 lbs since she was diagnosed with type 2 DM).  HENT: Negative.   Eyes: Negative.   Respiratory: Negative.   Cardiovascular: Negative.   Gastrointestinal: Positive for heartburn.  Genitourinary: Negative.   Musculoskeletal: Negative.   Skin: Negative.   Neurological: Negative.   Endo/Heme/Allergies: Negative.   Psychiatric/Behavioral: Negative.      Current Medications (verified) Outpatient Encounter Prescriptions as of 05/21/2016  Medication Sig  . betaxolol (BETOPTIC-S) 0.5 % ophthalmic suspension Place into both eyes 2 (two) times daily.   . bimatoprost (LUMIGAN) 0.03 % ophthalmic solution 1 drop at bedtime.  . dorzolamide-timolol (COSOPT) 22.3-6.8 MG/ML ophthalmic solution Place 1 drop into the left eye 2 (two) times daily. Reported on 04/17/2016  . glucose blood (BAYER CONTOUR NEXT TEST) test strip Use to check BG daily  . omeprazole (PRILOSEC) 20 MG capsule TAKE ONE (1) CAPSULE EACH DAY  . valsartan-hydrochlorothiazide (DIOVAN-HCT) 160-12.5 MG tablet TAKE ONE (1) TABLET EACH DAY  . BAYER MICROLET LANCETS lancets Use to check BG daily  . [DISCONTINUED] latanoprost (XALATAN) 0.005 % ophthalmic solution Place 1-2 drops into both eyes 2 (two) times daily. Reported on 05/21/2016  . [DISCONTINUED] polyethylene glycol powder (MIRALAX) powder Take 1 Container by mouth. Reported on 05/21/2016  . [DISCONTINUED] pravastatin (PRAVACHOL) 20 MG tablet Take 1 tablet (20 mg total) by mouth daily. (Patient not taking:  Reported on 04/17/2016)   No facility-administered encounter medications on file as of 05/21/2016.    Allergies (verified) Metformin and related; Aspirin; and Nsaids   History: Past Medical History  Diagnosis Date  . Hypertension   . GERD (gastroesophageal reflux disease)   . Arthritis   . 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 Personal history of colonic polyps 12/09/2012  . Diabetes mellitus without complication ([place])   . Heel pain     right heel pain  . Plantar fasciitis     right foot   Past Surgical History  Procedure Laterality Date  . Breast lumpectomy  10 + yrs.    left breast/benign  . Cataract extraction w/phaco  02/14/2012    Procedure: CATARACT EXTRACTION PHACO AND INTRAOCULAR LENS PLACEMENT (IOC);  Surgeon: Tonny Branch, MD;  Location: [facility];  Service: Ophthalmology;  Laterality: Left;  CDE 13.73  . Cataract extraction w/phaco  03/03/2012    Procedure: CATARACT EXTRACTION PHACO AND INTRAOCULAR LENS PLACEMENT (IOC);  Surgeon: Tonny Branch, MD;  Location: [facility];  Service: Ophthalmology;  Laterality: Right;  CDE: 16.49  . Colonoscopy    . Percutaneous micrfasciotomy with topaz coblation wand  12/2015   Family History  Problem Relation Age of Onset  . Anesthesia problems Neg Hx   . Hypotension Neg Hx   . Malignant hyperthermia Neg Hx   . Pseudochol deficiency Neg Hx   . Colon polyps Mother   . Diabetes Mother   . Colon cancer Maternal Aunt   . Heart disease Father   .  Stroke Father   . Dementia Father   . Lung disease Father     realted to asbestos exposure  . Thyroid disease Brother   . Diabetes Brother    Social History   Occupational History  . Not on file.   Social History Main Topics  . Smoking status: Former Smoker -- 1.00 packs/day for 5 years    Types: Cigarettes    Start date: 03/10/1962    Quit date: 03/11/1967  . Smokeless tobacco: Never Used     Comment: smoked for 4-5 years from age 51- 52 years old  . Alcohol Use: No  . Drug  Use: No  . Sexual Activity: No    Do you feel safe at home?  Yes Are there smokers in your home (other than you)? No  Dietary issues and exercise activities: Current Exercise Habits: The patient does not participate in regular exercise at present, Exercise limited by: orthopedic condition(s)  Current Dietary habits:  Patient has reduced sugar and high CHO foods. She has reduced serving sizes and is eating more vegetables.    Objective:    Today's Vitals   05/21/16 1030  BP: 122/68  Pulse: 72  Height: 5' (1.524 m)  Weight: 202 lb (91.627 kg)  PainSc: 0-No pain   Body mass index is 39.45 kg/(m^2).   A1c = 6.5% today (previous was 7.0%)  Activities of Daily Living In your present state of health, do you have any difficulty performing the following activities: 05/21/2016  Hearing? N  Vision? Y  Difficulty concentrating or making decisions? N  Walking or climbing stairs? Y  Dressing or bathing? N  Doing errands, shopping? N  Preparing Food and eating ? N  Using the Toilet? N  In the past six months, have you accidently leaked urine? N  Do you have problems with loss of bowel control? N  Managing your Medications? N  Managing your Finances? N  Housekeeping or managing your Housekeeping? N     Cardiac Risk Factors include: advanced age (>16men, >63 women);dyslipidemia;hypertension;obesity (BMI >30kg/m2);sedentary lifestyle;diabetes mellitus  Depression Screen PHQ 2/9 Scores 05/21/2016 02/16/2016 02/09/2015  PHQ - 2 Score 0 0 0     Fall Risk Fall Risk  05/21/2016 02/16/2016 02/09/2015  Falls in the past year? No No No   Diabetic Foot Form - Detailed   Diabetic Foot Exam - detailed  Diabetic Foot exam was performed with the following findings:  Yes 05/21/2016 11:01 AM  Visual Foot Exam completed.:  Yes  Is there a history of foot ulcer?:  No  Can the patient see the bottom of their feet?:  Yes  Are the shoes appropriate in style and fit?:  Yes  Is there swelling or and  abnormal foot shape?:  No  Are the toenails long?:  Yes  Are the toenails thick?:  No  Do you have pain in calf while walking?:  No  Is there a claw toe deformity?:  Yes  Is there elevated skin temparature?:  No  Is there limited skin dorsiflexion?:  No  Is there foot or ankle muscle weakness?:  No  Are the toenails ingrown?:  No  Normal Range of Motion:  Yes    Pulse Foot Exam completed.:  Yes  Right posterior Tibialias:  Present Left posterior Tibialias:  Present  Right Dorsalis Pedis:  Present Left Dorsalis Pedis:  Present  Sensory Foot Exam Completed.:  Yes  Swelling:  No  Semmes-Weinstein Monofilament Test  R Foot Test Control:  Pos L Foot Test Control:  Pos  R Site 1-Great Toe:  Pos L Site 1-Great Toe:  Pos  R Site 4:  Pos L Site 4:  Pos  R Site 5:  Pos L Site 5:  Pos         Cognitive Function: MMSE - Mini Mental State Exam 05/21/2016  Orientation to time 5  Orientation to Place 5  Registration 3  Attention/ Calculation 5  Recall 2  Language- name 2 objects 2  Language- repeat 1  Language- follow 3 step command 3  Language- read & follow direction 1  Write a sentence 1  Copy design 1  Total score 29    Immunizations and Health Maintenance Immunization History  Administered Date(s) Administered  . Influenza,inj,Quad PF,36+ Mos 10/06/2013, 09/22/2014  . Influenza-Unspecified 10/10/2015  . Pneumococcal Conjugate-13 10/06/2013  . Pneumococcal Polysaccharide-23 02/16/2016  . Zoster 04/21/2014   Health Maintenance Due  Topic Date Due  . FOOT EXAM  03/10/1954  . OPHTHALMOLOGY EXAM  03/10/1954  . TETANUS/TDAP  03/11/1963  . DEXA SCAN  03/10/2009    Patient Care Team: Chipper Herb, MD as PCP - General (Family Medicine) Clent Jacks, MD as Consulting Physician (Ophthalmology) Steffanie Rainwater, DPM as Consulting Physician (Podiatry) Viona Gilmore Evette Cristal, MD as Consulting Physician (Obstetrics and Gynecology)  Indicate any recent Medical Services you may have received  from other than [facility] providers in the past year (date may be approximate).    Assessment:    Annual Wellness Visit  Type 2 DM - improved control and at goal Obesity - great job with diet - has lost 14 lbs in 3 months   Screening Tests Health Maintenance  Topic Date Due  . FOOT EXAM  03/10/1954  . OPHTHALMOLOGY EXAM  03/10/1954  . TETANUS/TDAP  03/11/1963  . DEXA SCAN  03/10/2009  . INFLUENZA VACCINE  07/10/2016  . HEMOGLOBIN A1C  08/18/2016  . MAMMOGRAM  12/07/2017  . COLONOSCOPY  05/02/2021  . ZOSTAVAX  Completed  . Hepatitis C Screening  Completed  . PNA vac Low Risk Adult  Completed        Plan:   During the course of the visit Ashae was educated and counseled about the following appropriate screening and preventive services:   Vaccines to include Pneumoccal, Influenza, Td, Zostavax - declined Boostrix due to cost  Colorectal cancer screening - colonoscopy is UTD  Cardiovascular disease screening - consider EKG at next PCP appt  Diabetes - much improved.  Continue with current TLC  Bone Denisty / Osteoporosis Screening - due now - our DEXA is being up dated - will order in next 1-2 months  Mammogram - UTD  PAP - sees Dr Nori Riis for this  Glaucoma screening / Diabetic Eye Exam - done 04/2016 by Dr Katy Fitch.  Will request copy of office notes  Nutrition counseling - continue to limit CHO and caloric intake.  Discussed increasing fruit and vegetables intake to 5 per day.    Advanced Directives - information given  Physical Activity - gave chair exercises for patient to do until Dr Irving Shows releases her post foot surgery.  Foot exam performed today  Orders Placed This Encounter  Procedures  . Bayer DCA Hb A1c Waived  . Microalbumin / creatinine urine ratio    Patient Instructions (the written plan) were given to the patient.   Cherre Robins, [facility]   05/21/2016

## 2016-05-21 NOTE — Patient Instructions (Addendum)
  Robin Lindsey , Thank you for taking time to come for your Medicare Wellness Visit. I appreciate your ongoing commitment to your health goals. Please review the following plan we discussed and let me know if I can assist you in the future.   These are the goals we discussed: Increase physical activity - try to do chair exercises from handout daily or walk as able - goal is 150 minutes weekly but start where you can. Continue with current diet - you have lost 14 lbs since March - great job.  A1c was down from 7% to 6.5%   This is a list of the screening recommended for you and due dates:  Health Maintenance  Topic Date Due  . Complete foot exam   Done today - 05/2017  . Eye exam for diabetics  03/10/1954 - will request records from Dr Katy Fitch  . Tetanus Vaccine  03/11/1963 - postponed due to not covered by insurance  . DEXA scan (bone density measurement)  03/10/2009 - planning for future  . Flu Shot  07/10/2016  . Hemoglobin A1C  08/18/2016  . Mammogram  12/07/2017  . Colon Cancer Screening  05/02/2021  . Shingles Vaccine  Completed  .  Hepatitis C: One time screening is recommended by Center for Disease Control  (CDC) for  adults born from [birth year] through 1967.   Completed  . Pneumonia vaccines  Completed

## 2016-05-22 ENCOUNTER — Encounter: Payer: Self-pay | Admitting: Pharmacist

## 2016-05-22 LAB — MICROALBUMIN / CREATININE URINE RATIO
Creatinine, Urine: 34.3 mg/dL
Microalbumin, Urine: <3 ug/mL

## 2016-05-29 DIAGNOSIS — M722 Plantar fascial fibromatosis: Secondary | ICD-10-CM | POA: Diagnosis not present

## 2016-05-29 DIAGNOSIS — M79671 Pain in right foot: Secondary | ICD-10-CM | POA: Diagnosis not present

## 2016-05-31 ENCOUNTER — Other Ambulatory Visit: Payer: Self-pay | Admitting: Nurse Practitioner

## 2016-08-22 ENCOUNTER — Ambulatory Visit (INDEPENDENT_AMBULATORY_CARE_PROVIDER_SITE_OTHER): Payer: Medicare Other

## 2016-08-22 ENCOUNTER — Encounter: Payer: Self-pay | Admitting: Pharmacist

## 2016-08-22 ENCOUNTER — Ambulatory Visit (INDEPENDENT_AMBULATORY_CARE_PROVIDER_SITE_OTHER): Payer: Medicare Other | Admitting: Pharmacist

## 2016-08-22 ENCOUNTER — Ambulatory Visit: Payer: Medicare Other

## 2016-08-22 VITALS — BP 120/70 | HR 68 | Ht 60.0 in | Wt 185.0 lb

## 2016-08-22 DIAGNOSIS — E669 Obesity, unspecified: Secondary | ICD-10-CM | POA: Diagnosis not present

## 2016-08-22 DIAGNOSIS — Z780 Asymptomatic menopausal state: Secondary | ICD-10-CM

## 2016-08-22 DIAGNOSIS — E119 Type 2 diabetes mellitus without complications: Secondary | ICD-10-CM | POA: Diagnosis not present

## 2016-08-22 DIAGNOSIS — M8580 Other specified disorders of bone density and structure, unspecified site: Secondary | ICD-10-CM | POA: Insufficient documentation

## 2016-08-22 LAB — BAYER DCA HB A1C WAIVED: HB A1C: 6 % (ref <7.0)

## 2016-08-22 NOTE — Patient Instructions (Signed)
Great job with diet changes and weight loss!  You have lost 35 lbs in the last 6 months.   You A1c is now 6.0% (down from 6.5% 3 months ago)

## 2016-08-22 NOTE — Progress Notes (Signed)
Patient ID: Robin Lindsey, female   DOB: 02-Jan-1946, 70 y.o.   MRN: [id number] Subjective:    Robin Lindsey is a 70 y.o. female who presents for re-evaluation Type 2 diabetes mellitus and obesity.  She was initially diagnosed with DM 02/2016.  She tried metformin but stopped due to diarrhea - even tried low dose 500mg  XR.  Her last A1c was 6.5% (05/2016) She has lost 35# over the last 6 months through dietary changes.  Known diabetic complications: none Cardiovascular risk factors: advanced age (older than 38 for men, 10 for women), diabetes mellitus, dyslipidemia, hypertension, obesity (BMI >= 30 kg/m2) and sedentary lifestyle  Eye exam current (within one year): yes Weight trend: decreasing steadily Prior visit with dietician: no Current diet: in general, a "healthy" diet  , low calorie / low CHO diet Current exercise: none  Current monitoring regimen: none and home blood tests - 1-2 times daily Home blood sugar records: 30 day average = 115 99, 93, 85, 137, 99, 116, 102, 120, 119, 98, 145, 113, 98, 108 Any episodes of hypoglycemia? no  Is She on ACE inhibitor or angiotensin II receptor blocker?  Yes    valsartan + HCTZ (Diovan HCT)  The following portions of the patient's history were reviewed and updated as appropriate: allergies, current medications, past family history, past medical history, past social history and problem list.   Objective:    BP 120/70   Pulse 68   Ht 5' (1.524 m)   Wt 185 lb (83.9 kg)   BMI 36.13 kg/m    A1c = 6.0%  DEXA:  08/22/2016 L1- L4 = -1.6 Neck right hip = -1.8  12/27/2010 L1-L4 = -1.8 Neck right hip = -1.7  Lab Review Glucose (mg/dL)  Date Value  02/16/2016 111 (H)  02/09/2015 195 (H)  04/21/2014 114 (H)   Glucose, Bld (mg/dL)  Date Value  04/23/2013 115 (H)  02/11/2012 115 (H)   CO2 (mmol/L)  Date Value  02/16/2016 26  02/09/2015 25  04/21/2014 26   BUN (mg/dL)  Date Value  02/16/2016 13  02/09/2015 13   04/21/2014 13   Creat (mg/dL)  Date Value  04/23/2013 0.92   Creatinine, Ser (mg/dL)  Date Value  02/16/2016 0.96  02/09/2015 0.98  04/21/2014 0.89      Assessment:    Diabetes Mellitus type II - well controlled Obesity - has lost 35# over the last 6 months Osteopenia with low fracture risk  - stables BMD Low serum vitamin D - patient cannot tolerate vitamin d supplements  Plan:    1.  Rx changes - none continue with dietary changes 2.  Education: Reviewed 'ABCs' of diabetes management (respective goals in parentheses):  A1C (<7), blood pressure (<130/80), and cholesterol (LDL <100). 3.  Continue with low calorie / low CHO diet.  Great job.  Also disucssed getting 1200mg  calcium dialy through diet and / or supplementation and 800 IU of vitamin D 4.  Reviewed and post prandial BG goals. 5.  Encouraged physical activity / weight bearing exercise - start with 81minutes daily and work up to 30 minutes as able. 6.  Follow up: 3 months  with PCP

## 2016-08-28 ENCOUNTER — Other Ambulatory Visit: Payer: Self-pay | Admitting: Family Medicine

## 2016-08-31 DIAGNOSIS — H401111 Primary open-angle glaucoma, right eye, mild stage: Secondary | ICD-10-CM | POA: Diagnosis not present

## 2016-08-31 DIAGNOSIS — H401123 Primary open-angle glaucoma, left eye, severe stage: Secondary | ICD-10-CM | POA: Diagnosis not present

## 2016-08-31 DIAGNOSIS — Z961 Presence of intraocular lens: Secondary | ICD-10-CM | POA: Diagnosis not present

## 2016-08-31 DIAGNOSIS — E119 Type 2 diabetes mellitus without complications: Secondary | ICD-10-CM | POA: Diagnosis not present

## 2016-09-12 ENCOUNTER — Other Ambulatory Visit: Payer: Self-pay | Admitting: Family Medicine

## 2016-10-01 ENCOUNTER — Ambulatory Visit (INDEPENDENT_AMBULATORY_CARE_PROVIDER_SITE_OTHER): Payer: Medicare Other

## 2016-10-01 DIAGNOSIS — Z23 Encounter for immunization: Secondary | ICD-10-CM | POA: Diagnosis not present

## 2016-10-17 ENCOUNTER — Ambulatory Visit: Payer: Medicare Other | Admitting: Nurse Practitioner

## 2016-11-29 DIAGNOSIS — Z124 Encounter for screening for malignant neoplasm of cervix: Secondary | ICD-10-CM | POA: Diagnosis not present

## 2016-11-29 DIAGNOSIS — Z6835 Body mass index (BMI) 35.0-35.9, adult: Secondary | ICD-10-CM | POA: Diagnosis not present

## 2016-12-12 DIAGNOSIS — Z1231 Encounter for screening mammogram for malignant neoplasm of breast: Secondary | ICD-10-CM | POA: Diagnosis not present

## 2016-12-12 DIAGNOSIS — Z803 Family history of malignant neoplasm of breast: Secondary | ICD-10-CM | POA: Diagnosis not present

## 2016-12-24 ENCOUNTER — Ambulatory Visit: Payer: Self-pay | Admitting: Pediatrics

## 2017-01-02 DIAGNOSIS — H401111 Primary open-angle glaucoma, right eye, mild stage: Secondary | ICD-10-CM | POA: Diagnosis not present

## 2017-01-02 DIAGNOSIS — Z961 Presence of intraocular lens: Secondary | ICD-10-CM | POA: Diagnosis not present

## 2017-01-02 DIAGNOSIS — H401123 Primary open-angle glaucoma, left eye, severe stage: Secondary | ICD-10-CM | POA: Diagnosis not present

## 2017-02-27 ENCOUNTER — Encounter: Payer: Self-pay | Admitting: Pediatrics

## 2017-02-27 ENCOUNTER — Ambulatory Visit (INDEPENDENT_AMBULATORY_CARE_PROVIDER_SITE_OTHER): Payer: Medicare Other | Admitting: Pediatrics

## 2017-02-27 ENCOUNTER — Ambulatory Visit (INDEPENDENT_AMBULATORY_CARE_PROVIDER_SITE_OTHER): Payer: Medicare Other

## 2017-02-27 VITALS — BP 124/70 | HR 74 | Temp 96.8°F | Ht 60.0 in | Wt 180.4 lb

## 2017-02-27 DIAGNOSIS — E119 Type 2 diabetes mellitus without complications: Secondary | ICD-10-CM

## 2017-02-27 DIAGNOSIS — K219 Gastro-esophageal reflux disease without esophagitis: Secondary | ICD-10-CM | POA: Diagnosis not present

## 2017-02-27 DIAGNOSIS — I10 Essential (primary) hypertension: Secondary | ICD-10-CM | POA: Diagnosis not present

## 2017-02-27 DIAGNOSIS — M546 Pain in thoracic spine: Secondary | ICD-10-CM

## 2017-02-27 LAB — BAYER DCA HB A1C WAIVED: HB A1C: 5.8 % (ref <7.0)

## 2017-02-27 MED ORDER — VALSARTAN-HYDROCHLOROTHIAZIDE 160-12.5 MG PO TABS: 1.0000 | ORAL_TABLET | Freq: Every day | ORAL | 5 refills | Status: DC

## 2017-02-27 NOTE — Progress Notes (Signed)
  Subjective:   Patient ID: Robin Lindsey, female    DOB: 05-23-1946, 71 y.o.   MRN: [id number] CC: Fall (2 weeks ago) and Back Pain (Mid to lower)  HPI: Robin Lindsey is a 71 y.o. female presenting for Fall (2 weeks ago) and Back Pain (Mid to lower)  Hit head and back on nightstand after steppin gdown hard from step stool Head feels fine now Back hurts in the middle when she is bending over a lot No pain now Showering, shampooing sometimes sets it off Pain gets worse throughout the day No pain in the morning when she first gets up Tylenol helps some Heating pad helps  No chest pain, no HA, no SOB Taking meds regularly  Relevant past medical, surgical, family and social history reviewed. Allergies and medications reviewed and updated. History  Smoking Status  . Former Smoker  . Packs/day: 1.00  . Years: 5.00  . Types: Cigarettes  . Start date: 03/10/1962  . Quit date: 03/11/1967  Smokeless Tobacco  . Never Used    Comment: smoked for 4-5 years from age 36- 20 years old   ROS: Per HPI   Objective:    BP 124/70   Pulse 74   Temp (!) 96.8 F (36 C) (Oral)   Ht 5' (1.524 m)   Wt 180 lb 6.4 oz (81.8 kg)   BMI 35.23 kg/m   Wt Readings from Last 3 Encounters:  02/27/17 180 lb 6.4 oz (81.8 kg)  08/22/16 185 lb (83.9 kg)  05/21/16 202 lb (91.6 kg)    Gen: NAD, alert, cooperative with exam, NCAT EYES: EOMI, no conjunctival injection, or no icterus CV: NRRR, normal S1/S2 Resp: CTABL, no wheezes, normal WOB Abd: +BS, soft, NTND. no guarding or organomegaly Ext: No edema, warm Neuro: Alert and oriented, strength equal b/l UE and LE, coordination grossly normal MSK: mildly TTP mid back b/l, no point tenderness over spine  Thoracic spin XR: "IMPRESSION: No acute abnormality in the thoracic spine.  Possible undisplaced fractures in the left eighth and ninth ribs Posteriorly"  Assessment & Plan:  Elesha was seen today for fall and back pain.  Diagnoses and all orders  for this visit:  Essential hypertension Well controlled, cont current meds -     BMP8+EGFR -     valsartan-hydrochlorothiazide (DIOVAN-HCT) 160-12.5 MG tablet; Take 1 tablet by mouth daily.  Gastroesophageal reflux disease, esophagitis presence not specified Taking PPI daily now, no symptoms Discussed weaning off of PPI, pt will try  Diabetes mellitus without complication (HCC) J0K 5.8, cont avoiding sugary foods -     Bayer DCA Hb A1c Waived  Acute bilateral thoracic back pain XR with possible fracture lower L ribs, is in area of pain, no PNTX Discussed symptom care, avoid NSAIDs with Cr elevation -     DG Thoracic Spine 2 View; Future   Follow up plan: Return in about 3 months (around 05/30/2017). Assunta Found, MD [place]

## 2017-02-28 LAB — BMP8+EGFR
BUN/Creatinine Ratio: 14 (ref 12–28)
BUN: 20 mg/dL (ref 8–27)
CALCIUM: 9 mg/dL (ref 8.7–10.3)
CO2: 26 mmol/L (ref 18–29)
CREATININE: 1.38 mg/dL — AB (ref 0.57–1.00)
Chloride: 99 mmol/L (ref 96–106)
GFR, EST AFRICAN AMERICAN: 44 mL/min/{1.73_m2} — AB (ref >59)
GFR, EST NON AFRICAN AMERICAN: 38 mL/min/{1.73_m2} — AB (ref >59)
Glucose: 100 mg/dL — ABNORMAL HIGH (ref 65–99)
POTASSIUM: 4.1 mmol/L (ref 3.5–5.2)
Sodium: 141 mmol/L (ref 134–144)

## 2017-03-06 ENCOUNTER — Telehealth: Payer: Self-pay | Admitting: Family Medicine

## 2017-03-06 ENCOUNTER — Other Ambulatory Visit: Payer: Self-pay | Admitting: *Deleted

## 2017-03-06 DIAGNOSIS — K219 Gastro-esophageal reflux disease without esophagitis: Secondary | ICD-10-CM

## 2017-03-06 MED ORDER — OMEPRAZOLE 20 MG PO CPDR: DELAYED_RELEASE_CAPSULE | ORAL | 1 refills | Status: DC

## 2017-03-06 NOTE — Telephone Encounter (Signed)
Patient has too much reflux when she takes the omeprazole every 3 days.  She needs to get a refill or start on another medication to aid with problem. Please send to the Drug Store.

## 2017-03-06 NOTE — Telephone Encounter (Signed)
What is the name of the medication? omeprazole  Have you contacted your pharmacy to request a refill? No. Saw vincent and is supposed to discontinue med gradually. Needs refill to do so.   Which pharmacy would you like this sent to? The drug store in [place]   Patient notified that their request is being sent to the clinical staff for review and that they should receive a call once it is complete. If they do not receive a call within 24 hours they can check with their pharmacy or our office.

## 2017-03-06 NOTE — Progress Notes (Signed)
Refill sent in per pt request Okay per Dr Evette Doffing

## 2017-03-07 MED ORDER — OMEPRAZOLE 20 MG PO CPDR: DELAYED_RELEASE_CAPSULE | ORAL | 1 refills | Status: DC

## 2017-03-07 NOTE — Telephone Encounter (Signed)
Patient aware.

## 2017-05-07 DIAGNOSIS — H401122 Primary open-angle glaucoma, left eye, moderate stage: Secondary | ICD-10-CM | POA: Diagnosis not present

## 2017-05-07 DIAGNOSIS — Z961 Presence of intraocular lens: Secondary | ICD-10-CM | POA: Diagnosis not present

## 2017-05-07 DIAGNOSIS — H401111 Primary open-angle glaucoma, right eye, mild stage: Secondary | ICD-10-CM | POA: Diagnosis not present

## 2017-05-07 DIAGNOSIS — E119 Type 2 diabetes mellitus without complications: Secondary | ICD-10-CM | POA: Diagnosis not present

## 2017-05-07 LAB — HM DIABETES EYE EXAM

## 2017-05-23 ENCOUNTER — Encounter: Payer: Self-pay | Admitting: Pediatrics

## 2017-05-23 ENCOUNTER — Telehealth: Payer: Self-pay | Admitting: Family Medicine

## 2017-05-23 ENCOUNTER — Ambulatory Visit (INDEPENDENT_AMBULATORY_CARE_PROVIDER_SITE_OTHER): Payer: Medicare Other | Admitting: Pediatrics

## 2017-05-23 VITALS — BP 136/68 | HR 85 | Temp 97.8°F | Ht 60.0 in | Wt 176.2 lb

## 2017-05-23 DIAGNOSIS — N289 Disorder of kidney and ureter, unspecified: Secondary | ICD-10-CM

## 2017-05-23 DIAGNOSIS — S70362A Insect bite (nonvenomous), left thigh, initial encounter: Secondary | ICD-10-CM | POA: Diagnosis not present

## 2017-05-23 DIAGNOSIS — W57XXXA Bitten or stung by nonvenomous insect and other nonvenomous arthropods, initial encounter: Secondary | ICD-10-CM | POA: Diagnosis not present

## 2017-05-23 DIAGNOSIS — I10 Essential (primary) hypertension: Secondary | ICD-10-CM

## 2017-05-23 DIAGNOSIS — E119 Type 2 diabetes mellitus without complications: Secondary | ICD-10-CM | POA: Diagnosis not present

## 2017-05-23 NOTE — Progress Notes (Signed)
  Subjective:   Patient ID: Robin Lindsey, female    DOB: [birth year], 71 y.o.   MRN: [id number] CC: Insect Bite (Tick)  HPI: Robin Lindsey is a 71 y.o. female presenting for Insect Bite (Tick)  Noticed it yesterday Has been very itchy Attached for less than a day Feeling well otherwise No aches, pains, no joint swelling No fevers Red bump around tick bite  DM2: not checking BGLs regularly  HTN: no CP, no SOB  Relevant past medical, surgical, family and social history reviewed. Allergies and medications reviewed and updated. History  Smoking Status  . Former Smoker  . Packs/day: 1.00  . Years: 5.00  . Types: Cigarettes  . Start date: 03/10/1962  . Quit date: 03/11/1967  Smokeless Tobacco  . Never Used    Comment: smoked for 4-5 years from age 76- 83 years old   ROS: Per HPI   Objective:    BP 136/68   Pulse 85   Temp 97.8 F (36.6 C) (Oral)   Ht 5' (1.524 m)   Wt 176 lb 3.2 oz (79.9 kg)   BMI 34.41 kg/m   Wt Readings from Last 3 Encounters:  05/23/17 176 lb 3.2 oz (79.9 kg)  02/27/17 180 lb 6.4 oz (81.8 kg)  08/22/16 185 lb (83.9 kg)    Gen: NAD, alert, cooperative with exam, NCAT EYES: EOMI, no conjunctival injection, or no icterus ENT:   OP without erythema LYMPH: no cervical LAD CV: NRRR, normal S1/S2, no murmur Resp: CTABL, no wheezes, normal WOB Ext: No edema, warm Neuro: Alert and oriented, strength equal b/l UE and LE, coordination grossly normal MSK: normal muscle bulk Skin: L upper inner leg with apprx 1 cm of red papule   Assessment & Plan:  Robin Lindsey was seen today for insect bite.  Diagnoses and all orders for this visit:  Essential hypertension Adequate control, cont current meds, rechecking Cr today -     Basic Metabolic Panel  Tick bite, initial encounter Discussed symptom care, return precautions Use topical hydrocortisone as needed for itching  Type 2 diabetes, diet controlled (HCC) Check BGL once a week, if over 150 let me  know  Renal insufficiency Recheck Cr today  Follow up plan: 6 mo Assunta Found, MD [place]

## 2017-05-23 NOTE — Telephone Encounter (Signed)
Pt had tick bite yesterday Now redness with itching appt scheduled

## 2017-05-24 LAB — BASIC METABOLIC PANEL
BUN / CREAT RATIO: 14 (ref 12–28)
BUN: 12 mg/dL (ref 8–27)
CO2: 27 mmol/L (ref 20–29)
Calcium: 9.5 mg/dL (ref 8.7–10.3)
Chloride: 99 mmol/L (ref 96–106)
Creatinine, Ser: 0.85 mg/dL (ref 0.57–1.00)
GFR, EST AFRICAN AMERICAN: 79 mL/min/{1.73_m2} (ref >59)
GFR, EST NON AFRICAN AMERICAN: 68 mL/min/{1.73_m2} (ref >59)
Glucose: 151 mg/dL — ABNORMAL HIGH (ref 65–99)
Potassium: 4 mmol/L (ref 3.5–5.2)
Sodium: 140 mmol/L (ref 134–144)

## 2017-05-31 ENCOUNTER — Ambulatory Visit: Payer: Medicare Other | Admitting: Pediatrics

## 2017-05-31 ENCOUNTER — Other Ambulatory Visit: Payer: Self-pay | Admitting: Pediatrics

## 2017-05-31 DIAGNOSIS — I10 Essential (primary) hypertension: Secondary | ICD-10-CM

## 2017-07-05 ENCOUNTER — Encounter: Payer: Self-pay | Admitting: Family

## 2017-07-05 ENCOUNTER — Ambulatory Visit (INDEPENDENT_AMBULATORY_CARE_PROVIDER_SITE_OTHER): Payer: Medicare Other | Admitting: Family

## 2017-07-05 ENCOUNTER — Ambulatory Visit: Payer: Medicare Other | Admitting: Family

## 2017-07-05 VITALS — BP 130/67 | HR 79 | Temp 97.5°F | Ht 60.0 in | Wt 172.0 lb

## 2017-07-05 DIAGNOSIS — S70362A Insect bite (nonvenomous), left thigh, initial encounter: Secondary | ICD-10-CM

## 2017-07-05 DIAGNOSIS — K219 Gastro-esophageal reflux disease without esophagitis: Secondary | ICD-10-CM | POA: Diagnosis not present

## 2017-07-05 DIAGNOSIS — F411 Generalized anxiety disorder: Secondary | ICD-10-CM

## 2017-07-05 DIAGNOSIS — W57XXXA Bitten or stung by nonvenomous insect and other nonvenomous arthropods, initial encounter: Secondary | ICD-10-CM

## 2017-07-05 MED ORDER — OMEPRAZOLE 20 MG PO CPDR: DELAYED_RELEASE_CAPSULE | ORAL | 1 refills | Status: DC

## 2017-07-05 MED ORDER — ALPRAZOLAM 0.25 MG PO TABS: 0.2500 mg | ORAL_TABLET | Freq: Every day | ORAL | 0 refills | Status: DC | PRN

## 2017-07-05 MED ORDER — ESCITALOPRAM OXALATE 10 MG PO TABS: 10.0000 mg | ORAL_TABLET | Freq: Every day | ORAL | 3 refills | Status: DC

## 2017-07-05 NOTE — Patient Instructions (Signed)
Stress and Stress Management Stress is a normal reaction to life events. It is what you feel when life demands more than you are used to or more than you can handle. Some stress can be useful. For example, the stress reaction can help you catch the last bus of the day, study for a test, or meet a deadline at work. But stress that occurs too often or for too long can cause problems. It can affect your emotional health and interfere with relationships and normal daily activities. Too much stress can weaken your immune system and increase your risk for physical illness. If you already have a medical problem, stress can make it worse. What are the causes? All sorts of life events may cause stress. An event that causes stress for one person may not be stressful for another person. Major life events commonly cause stress. These may be positive or negative. Examples include losing your job, moving into a new home, getting married, having a baby, or losing a loved one. Less obvious life events may also cause stress, especially if they occur day after day or in combination. Examples include working long hours, driving in traffic, caring for children, being in debt, or being in a difficult relationship. What are the signs or symptoms? Stress may cause emotional symptoms including, the following:  Anxiety. This is feeling worried, afraid, on edge, overwhelmed, or out of control.  Anger. This is feeling irritated or impatient.  Depression. This is feeling sad, down, helpless, or guilty.  Difficulty focusing, remembering, or making decisions.  Stress may cause physical symptoms, including the following:  Aches and pains. These may affect your head, neck, back, stomach, or other areas of your body.  Tight muscles or clenched jaw.  Low energy or trouble sleeping.  Stress may cause unhealthy behaviors, including the following:  Eating to feel better (overeating) or skipping meals.  Sleeping too little,  too much, or both.  Working too much or putting off tasks (procrastination).  Smoking, drinking alcohol, or using drugs to feel better.  How is this diagnosed? Stress is diagnosed through an assessment by your health care provider. Your health care provider will ask questions about your symptoms and any stressful life events.Your health care provider will also ask about your medical history and may order blood tests or other tests. Certain medical conditions and medicine can cause physical symptoms similar to stress. Mental illness can cause emotional symptoms and unhealthy behaviors similar to stress. Your health care provider may refer you to a mental health professional for further evaluation. How is this treated? Stress management is the recommended treatment for stress.The goals of stress management are reducing stressful life events and coping with stress in healthy ways. Techniques for reducing stressful life events include the following:  Stress identification. Self-monitor for stress and identify what causes stress for you. These skills may help you to avoid some stressful events.  Time management. Set your priorities, keep a calendar of events, and learn to say "no." These tools can help you avoid making too many commitments.  Techniques for coping with stress include the following:  Rethinking the problem. Try to think realistically about stressful events rather than ignoring them or overreacting. Try to find the positives in a stressful situation rather than focusing on the negatives.  Exercise. Physical exercise can release both physical and emotional tension. The key is to find a form of exercise you enjoy and do it regularly.  Relaxation techniques. These relax the body and  mind. Examples include yoga, meditation, tai chi, biofeedback, deep breathing, progressive muscle relaxation, listening to music, being out in nature, journaling, and other hobbies. Again, the key is to find  one or more that you enjoy and can do regularly.  Healthy lifestyle. Eat a balanced diet, get plenty of sleep, and do not smoke. Avoid using alcohol or drugs to relax.  Strong support network. Spend time with family, friends, or other people you enjoy being around.Express your feelings and talk things over with someone you trust.  Counseling or talktherapy with a mental health professional may be helpful if you are having difficulty managing stress on your own. Medicine is typically not recommended for the treatment of stress.Talk to your health care provider if you think you need medicine for symptoms of stress. Follow these instructions at home:  Keep all follow-up visits as directed by your health care provider.  Take all medicines as directed by your health care provider. Contact a health care provider if:  Your symptoms get worse or you start having new symptoms.  You feel overwhelmed by your problems and can no longer manage them on your own. Get help right away if:  You feel like hurting yourself or someone else. This information is not intended to replace advice given to you by your health care provider. Make sure you discuss any questions you have with your health care provider. Document Released: 05/22/2001 Document Revised: 05/03/2016 Document Reviewed: 07/21/2013 Elsevier Interactive Patient Education  2017 [employer]  

## 2017-07-05 NOTE — Progress Notes (Signed)
Subjective:    Patient ID: Robin Lindsey, female    DOB: 05/28/1946, 71 y.o.   MRN: [id number]   Pt presents to the office today with anxiety and stress. 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 Pt states her mother has been falling and last week broke several bones. PT is very anxious and crying stating that caring for her mother is a 24 hour job and she can't do it anymore, but her mother does not understand. States her mother is currently in Rehab now, but is upset that she will not be able to come home. PT is having a lot of guilt.   Pt is requesting xanax to get her through this time. Pt states she does not want to be on a "antidepressant" because she is scare of weight gain.   PT states she removed a tick from her left groin around 05/23/17. Pt states she noticed she had a lymph swollen in her left groin last week, but is better. She is requesting to be tested for lyme today.  Pt requesting her Prilosec 20 mg be restarted today because she has had increased heartburn, cough, and burping.   Anxiety  Presents for follow-up visit. Symptoms include excessive worry, irritability, nervous/anxious behavior, panic and restlessness. Patient reports no decreased concentration. Symptoms occur occasionally.        Review of Systems  Constitutional: Positive for irritability.  Psychiatric/Behavioral: Negative for decreased concentration. The patient is nervous/anxious.   All other systems reviewed and are negative.      Objective:   Physical Exam  Constitutional: She is oriented to person, place, and time. She appears well-developed and well-nourished. No distress.  HENT:  Head: Normocephalic.  Eyes: Pupils are equal, round, and reactive to light.  Neck: Normal range of motion. Neck supple. No thyromegaly present.  Cardiovascular: Normal rate, regular rhythm, normal heart sounds and intact distal pulses.   No murmur heard. Pulmonary/Chest: Effort normal and  breath sounds normal. No respiratory distress. She has no wheezes.  Abdominal: Soft. Bowel sounds are normal. She exhibits no distension. There is no tenderness.  Musculoskeletal: Normal range of motion. She exhibits no edema or tenderness.  Neurological: She is alert and oriented to person, place, and time. She has normal reflexes. No cranial nerve deficit.  Skin: Skin is warm and dry.  Psychiatric: Her behavior is normal. Judgment and thought content normal. Her mood appears anxious.  Pt tearful and crying  Vitals reviewed.    Temp (!) 97.5 F (36.4 C) (Oral)   Ht 5' (1.524 m)   Wt 172 lb (78 kg)   BMI 33.59 kg/m      Assessment & Plan:  1. GAD (generalized anxiety disorder) Long discussion about only taking xanax as needed, encouraged pt to start Lexapro 10 mg Stress management discussed  RTO in 6 weeks - ALPRAZolam (XANAX) 0.25 MG tablet; Take 1 tablet (0.25 mg total) by mouth daily as needed for anxiety.  Dispense: 20 tablet; Refill: 0 - escitalopram (LEXAPRO) 10 MG tablet; Take 1 tablet (10 mg total) by mouth daily.  Dispense: 90 tablet; Refill: 3 - BMP8+EGFR  2. Gastroesophageal reflux disease, esophagitis presence not specified PT's Prilosec started today -Diet discussed- Avoid fried, spicy, citrus foods, caffeine and alcohol -Do not eat 2-3 hours before bedtime -Encouraged small frequent meals -Avoid NSAID's - omeprazole (PRILOSEC) 20 MG capsule; TAKE ONE (1) CAPSULE EACH DAY  Dispense: 90 capsule; Refill: 1 - BMP8+EGFR  3. Tick bite, initial encounter -Pt to report any new fever, joint pain, or rash -Wear protective clothing while outside- Long sleeves and long pants -Put insect repellent on all exposed skin and along clothing -Take a shower as soon as possible after being outside - Lyme Ab/Western Blot Reflex - Rocky mtn spotted fvr abs pnl(IgG+IgM) - BMP8+EGFR   Continue all meds Labs pending Health Maintenance reviewed Diet and exercise encouraged RTO 6  weeks for GAD   Evelina Dun, FNP

## 2017-07-09 LAB — BMP8+EGFR
BUN/Creatinine Ratio: 16 (ref 12–28)
BUN: 15 mg/dL (ref 8–27)
CO2: 27 mmol/L (ref 20–29)
Calcium: 9.1 mg/dL (ref 8.7–10.3)
Chloride: 100 mmol/L (ref 96–106)
Creatinine, Ser: 0.96 mg/dL (ref 0.57–1.00)
GFR calc Af Amer: 68 mL/min/1.73 (ref >59)
GFR calc non Af Amer: 59 mL/min/1.73 — ABNORMAL LOW (ref >59)
Glucose: 110 mg/dL — ABNORMAL HIGH (ref 65–99)
Potassium: 4 mmol/L (ref 3.5–5.2)
Sodium: 141 mmol/L (ref 134–144)

## 2017-07-09 LAB — RMSF, IGG, IFA: RMSF, IGG, IFA: 1:64 {titer} — ABNORMAL HIGH

## 2017-07-09 LAB — LYME AB/WESTERN BLOT REFLEX: LYME DISEASE AB, QUANT, IGM: <0.8 index (ref 0.00–0.79)

## 2017-07-09 LAB — ROCKY MTN SPOTTED FVR ABS PNL(IGG+IGM)
RMSF IgG: UNDETERMINED
RMSF IgM: 0.17 {index} (ref 0.00–0.89)

## 2017-08-01 ENCOUNTER — Encounter: Payer: Self-pay | Admitting: *Deleted

## 2017-08-15 ENCOUNTER — Encounter: Payer: Self-pay | Admitting: Family

## 2017-08-15 ENCOUNTER — Ambulatory Visit (INDEPENDENT_AMBULATORY_CARE_PROVIDER_SITE_OTHER): Payer: Medicare Other | Admitting: Family

## 2017-08-15 DIAGNOSIS — F411 Generalized anxiety disorder: Secondary | ICD-10-CM | POA: Insufficient documentation

## 2017-08-15 DIAGNOSIS — I10 Essential (primary) hypertension: Secondary | ICD-10-CM

## 2017-08-15 MED ORDER — VALSARTAN-HYDROCHLOROTHIAZIDE 160-12.5 MG PO TABS: ORAL_TABLET | ORAL | 1 refills | Status: DC

## 2017-08-15 NOTE — Patient Instructions (Signed)

## 2017-08-15 NOTE — Progress Notes (Signed)
   Subjective:    Patient ID: Robin Lindsey, female    DOB: 11/10/1946, 71 y.o.   MRN: [id number]  Pt presents to the office today to recheck GAD after starting Lexapro 10 mg. Pt states her anxiety has greatly improved.  Anxiety  Presents for follow-up visit. Symptoms include depressed mood, excessive worry, irritability, nervous/anxious behavior and restlessness. Symptoms occur occasionally. The severity of symptoms is moderate. The quality of sleep is good.        Review of Systems  Constitutional: Positive for irritability.  Psychiatric/Behavioral: The patient is nervous/anxious.   All other systems reviewed and are negative.      Objective:   Physical Exam  Constitutional: She is oriented to person, place, and time. She appears well-developed and well-nourished. No distress.  HENT:  Head: Normocephalic and atraumatic.  Right Ear: External ear normal.  Left Ear: External ear normal.  Nose: Nose normal.  Mouth/Throat: Oropharynx is clear and moist.  Eyes: Pupils are equal, round, and reactive to light.  Neck: Normal range of motion. Neck supple. No thyromegaly present.  Cardiovascular: Normal rate, regular rhythm, normal heart sounds and intact distal pulses.   No murmur heard. Pulmonary/Chest: Effort normal and breath sounds normal. No respiratory distress. She has no wheezes.  Abdominal: Soft. Bowel sounds are normal. She exhibits no distension. There is no tenderness.  Musculoskeletal: Normal range of motion. She exhibits no edema or tenderness.  Neurological: She is alert and oriented to person, place, and time.  Skin: Skin is warm and dry.  Psychiatric: She has a normal mood and affect. Her behavior is normal. Judgment and thought content normal.  Vitals reviewed.     BP 133/82   Pulse 67   Temp (!) 97.5 F (36.4 C) (Oral)   Ht 5' (1.524 m)   Wt 173 lb 12.8 oz (78.8 kg)   BMI 33.94 kg/m      Assessment & Plan:  1. GAD (generalized anxiety  disorder) Continue lexapro Stress management discussed Report any adverse effects RTO 6 months    Evelina Dun, FNP

## 2017-08-16 ENCOUNTER — Ambulatory Visit: Payer: Medicare Other | Admitting: Family

## 2017-09-04 ENCOUNTER — Ambulatory Visit (INDEPENDENT_AMBULATORY_CARE_PROVIDER_SITE_OTHER): Payer: Medicare Other

## 2017-09-04 DIAGNOSIS — Z23 Encounter for immunization: Secondary | ICD-10-CM

## 2017-09-10 DIAGNOSIS — H401111 Primary open-angle glaucoma, right eye, mild stage: Secondary | ICD-10-CM | POA: Diagnosis not present

## 2017-09-10 DIAGNOSIS — H401123 Primary open-angle glaucoma, left eye, severe stage: Secondary | ICD-10-CM | POA: Diagnosis not present

## 2017-10-24 DIAGNOSIS — M79672 Pain in left foot: Secondary | ICD-10-CM | POA: Diagnosis not present

## 2017-10-24 DIAGNOSIS — M7742 Metatarsalgia, left foot: Secondary | ICD-10-CM | POA: Diagnosis not present

## 2017-12-12 DIAGNOSIS — M79672 Pain in left foot: Secondary | ICD-10-CM | POA: Diagnosis not present

## 2017-12-12 DIAGNOSIS — M216X2 Other acquired deformities of left foot: Secondary | ICD-10-CM | POA: Diagnosis not present

## 2017-12-12 DIAGNOSIS — M7742 Metatarsalgia, left foot: Secondary | ICD-10-CM | POA: Diagnosis not present

## 2017-12-12 DIAGNOSIS — M2042 Other hammer toe(s) (acquired), left foot: Secondary | ICD-10-CM | POA: Diagnosis not present

## 2017-12-24 DIAGNOSIS — Z1231 Encounter for screening mammogram for malignant neoplasm of breast: Secondary | ICD-10-CM | POA: Diagnosis not present

## 2018-01-01 DIAGNOSIS — Z01419 Encounter for gynecological examination (general) (routine) without abnormal findings: Secondary | ICD-10-CM | POA: Diagnosis not present

## 2018-01-01 DIAGNOSIS — R351 Nocturia: Secondary | ICD-10-CM | POA: Diagnosis not present

## 2018-01-01 DIAGNOSIS — N3945 Continuous leakage: Secondary | ICD-10-CM | POA: Diagnosis not present

## 2018-01-01 DIAGNOSIS — Z6837 Body mass index (BMI) 37.0-37.9, adult: Secondary | ICD-10-CM | POA: Diagnosis not present

## 2018-02-11 DIAGNOSIS — M7741 Metatarsalgia, right foot: Secondary | ICD-10-CM | POA: Diagnosis not present

## 2018-02-11 DIAGNOSIS — M79671 Pain in right foot: Secondary | ICD-10-CM | POA: Diagnosis not present

## 2018-02-11 DIAGNOSIS — M7742 Metatarsalgia, left foot: Secondary | ICD-10-CM | POA: Diagnosis not present

## 2018-02-11 DIAGNOSIS — M79672 Pain in left foot: Secondary | ICD-10-CM | POA: Diagnosis not present

## 2018-02-13 ENCOUNTER — Ambulatory Visit: Payer: Medicare Other | Admitting: Family

## 2018-02-25 DIAGNOSIS — M7741 Metatarsalgia, right foot: Secondary | ICD-10-CM | POA: Diagnosis not present

## 2018-02-25 DIAGNOSIS — M7742 Metatarsalgia, left foot: Secondary | ICD-10-CM | POA: Diagnosis not present

## 2018-02-25 DIAGNOSIS — M79672 Pain in left foot: Secondary | ICD-10-CM | POA: Diagnosis not present

## 2018-02-25 DIAGNOSIS — M79671 Pain in right foot: Secondary | ICD-10-CM | POA: Diagnosis not present

## 2018-03-03 ENCOUNTER — Ambulatory Visit (INDEPENDENT_AMBULATORY_CARE_PROVIDER_SITE_OTHER): Payer: Medicare Other | Admitting: Family

## 2018-03-03 ENCOUNTER — Encounter: Payer: Self-pay | Admitting: Family

## 2018-03-03 VITALS — BP 127/71 | HR 88 | Temp 101.6°F | Ht 60.0 in | Wt 189.0 lb

## 2018-03-03 DIAGNOSIS — I10 Essential (primary) hypertension: Secondary | ICD-10-CM

## 2018-03-03 DIAGNOSIS — R6889 Other general symptoms and signs: Secondary | ICD-10-CM | POA: Diagnosis not present

## 2018-03-03 LAB — VERITOR FLU A/B WAIVED
Influenza A: NEGATIVE
Influenza B: NEGATIVE

## 2018-03-03 MED ORDER — VALSARTAN-HYDROCHLOROTHIAZIDE 160-12.5 MG PO TABS: ORAL_TABLET | ORAL | 1 refills | Status: DC

## 2018-03-03 MED ORDER — OSELTAMIVIR PHOSPHATE 75 MG PO CAPS: 75.0000 mg | ORAL_CAPSULE | Freq: Two times a day (BID) | ORAL | 0 refills | Status: DC

## 2018-03-03 NOTE — Patient Instructions (Signed)

## 2018-03-03 NOTE — Progress Notes (Signed)
   Subjective:    Patient ID: Robin Lindsey, female    DOB: 1946/02/05, 72 y.o.   MRN: [id number]  Cough  This is a new problem. The current episode started yesterday. The problem has been gradually worsening. The problem occurs every few minutes. The cough is non-productive. Associated symptoms include chills, ear pain, a fever, headaches, myalgias, nasal congestion, rhinorrhea and shortness of breath. Pertinent negatives include no ear congestion or sore throat. She has tried rest for the symptoms. The treatment provided no relief.      Review of Systems  Constitutional: Positive for chills and fever.  HENT: Positive for ear pain and rhinorrhea. Negative for sore throat.   Respiratory: Positive for cough and shortness of breath.   Musculoskeletal: Positive for myalgias.  Neurological: Positive for headaches.  All other systems reviewed and are negative.      Objective:   Physical Exam  Constitutional: She is oriented to person, place, and time. She appears well-developed and well-nourished. No distress.  HENT:  Head: Normocephalic and atraumatic.  Right Ear: External ear normal.  Left Ear: External ear normal.  Nose: Mucosal edema and rhinorrhea present.  Mouth/Throat: Posterior oropharyngeal erythema present.  Eyes: Pupils are equal, round, and reactive to light.  Neck: Normal range of motion. Neck supple. No thyromegaly present.  Cardiovascular: Normal rate, regular rhythm, normal heart sounds and intact distal pulses.  No murmur heard. Pulmonary/Chest: Effort normal and breath sounds normal. No respiratory distress. She has no wheezes.  Intermittent dry cough   Abdominal: Soft. Bowel sounds are normal. She exhibits no distension. There is no tenderness.  Musculoskeletal: Normal range of motion. She exhibits no edema or tenderness.  Neurological: She is alert and oriented to person, place, and time.  Skin: Skin is warm and dry.  Psychiatric: She has a normal mood and  affect. Her behavior is normal. Judgment and thought content normal.  Vitals reviewed.     BP 127/71   Pulse 88   Temp (!) 101.6 F (38.7 C) (Oral)   Ht 5' (1.524 m)   Wt 189 lb (85.7 kg)   BMI 36.91 kg/m      Assessment & Plan:  1. Flu-like symptoms Rest Force fluids Tylenol or motrin Good hand hygiene discussed, cover mouth when coughing or sneezing RTO prn or if symptoms worsen or do not improve - Veritor Flu A/B Waived - oseltamivir (TAMIFLU) 75 MG capsule; Take 1 capsule (75 mg total) by mouth 2 (two) times daily.  Dispense: 10 capsule; Refill: 0  2. Essential hypertension - valsartan-hydrochlorothiazide (DIOVAN-HCT) 160-12.5 MG tablet; TAKE ONE (1) TABLET EACH DAY  Dispense: 90 tablet; Refill: [place], FNP

## 2018-03-05 ENCOUNTER — Telehealth: Payer: Self-pay | Admitting: Family

## 2018-03-05 MED ORDER — BENZONATATE 200 MG PO CAPS: 200.0000 mg | ORAL_CAPSULE | Freq: Two times a day (BID) | ORAL | 0 refills | Status: DC | PRN

## 2018-03-05 NOTE — Telephone Encounter (Signed)
Patient was seen 3/25 by [facility]. Was given Tamiflu and is requesting something for her cough sent into Pharmacy. Covering PCP- please advise

## 2018-03-05 NOTE — Telephone Encounter (Signed)
Sent in [place] for the patient

## 2018-03-05 NOTE — Telephone Encounter (Signed)
Pt notified of RX 

## 2018-03-07 ENCOUNTER — Encounter: Payer: Self-pay | Admitting: Family

## 2018-03-07 ENCOUNTER — Ambulatory Visit (INDEPENDENT_AMBULATORY_CARE_PROVIDER_SITE_OTHER): Payer: Medicare Other | Admitting: Family

## 2018-03-07 VITALS — BP 128/71 | HR 78 | Temp 98.1°F | Ht 60.0 in | Wt 185.6 lb

## 2018-03-07 DIAGNOSIS — R309 Painful micturition, unspecified: Secondary | ICD-10-CM

## 2018-03-07 DIAGNOSIS — N3001 Acute cystitis with hematuria: Secondary | ICD-10-CM

## 2018-03-07 LAB — URINALYSIS, COMPLETE
Bilirubin, UA: NEGATIVE
Glucose, UA: NEGATIVE
Ketones, UA: NEGATIVE
Nitrite, UA: NEGATIVE
PH UA: 6 (ref 5.0–7.5)
PROTEIN UA: NEGATIVE
Specific Gravity, UA: <1.005 — ABNORMAL LOW (ref 1.005–1.030)
Urobilinogen, Ur: 0.2 mg/dL (ref 0.2–1.0)

## 2018-03-07 LAB — MICROSCOPIC EXAMINATION
Epithelial Cells (non renal): >10 /hpf — AB (ref 0–10)
RENAL EPITHEL UA: NONE SEEN /HPF

## 2018-03-07 MED ORDER — CIPROFLOXACIN HCL 500 MG PO TABS: 500.0000 mg | ORAL_TABLET | Freq: Two times a day (BID) | ORAL | 0 refills | Status: DC

## 2018-03-07 NOTE — Patient Instructions (Signed)

## 2018-03-07 NOTE — Progress Notes (Signed)
   Subjective:    Patient ID: Robin Lindsey, female    DOB: [birth year], 72 y.o.   MRN: [id number]  Urinary Tract Infection   This is a new problem. The current episode started yesterday. The problem occurs intermittently. The problem has been waxing and waning. The quality of the pain is described as burning. The pain is at a severity of 3/10. The pain is mild. Associated symptoms include chills, flank pain, frequency, hesitancy and urgency. Pertinent negatives include no discharge, hematuria, nausea or vomiting. She has tried increased fluids for the symptoms. The treatment provided mild relief.      Review of Systems  Constitutional: Positive for chills.  Gastrointestinal: Negative for nausea and vomiting.  Genitourinary: Positive for flank pain, frequency, hesitancy and urgency. Negative for hematuria.  All other systems reviewed and are negative.      Objective:   Physical Exam  Constitutional: She is oriented to person, place, and time. She appears well-developed and well-nourished. No distress.  HENT:  Head: Normocephalic and atraumatic.  Right Ear: External ear normal.  Left Ear: External ear normal.  Nose: Nose normal.  Mouth/Throat: Oropharynx is clear and moist.  Eyes: Pupils are equal, round, and reactive to light.  Neck: Normal range of motion. Neck supple. No thyromegaly present.  Cardiovascular: Normal rate, regular rhythm, normal heart sounds and intact distal pulses.  No murmur heard. Pulmonary/Chest: Effort normal and breath sounds normal. No respiratory distress. She has no wheezes.  Abdominal: Soft. Bowel sounds are normal. She exhibits no distension. There is no tenderness.  Musculoskeletal: Normal range of motion. She exhibits no edema or tenderness.  Neurological: She is alert and oriented to person, place, and time.  Skin: Skin is warm and dry.  Psychiatric: She has a normal mood and affect. Her behavior is normal. Judgment and thought content normal.    Vitals reviewed.     BP 128/71   Pulse 78   Temp 98.1 F (36.7 C) (Oral)   Ht 5' (1.524 m)   Wt 185 lb 9.6 oz (84.2 kg)   BMI 36.25 kg/m      Assessment & Plan:  1. Pain passing urine - Urinalysis, Complete  2. Acute cystitis with hematuria Force fluids AZO over the counter X2 days RTO prn Culture pending - Urine Culture - ciprofloxacin (CIPRO) 500 MG tablet; Take 1 tablet (500 mg total) by mouth 2 (two) times daily.  Dispense: 14 tablet; Refill: 0   Evelina Dun, FNP

## 2018-03-09 LAB — URINE CULTURE

## 2018-03-13 ENCOUNTER — Ambulatory Visit: Payer: Medicare Other | Admitting: Family

## 2018-04-01 DIAGNOSIS — H26491 Other secondary cataract, right eye: Secondary | ICD-10-CM | POA: Diagnosis not present

## 2018-04-01 DIAGNOSIS — H401123 Primary open-angle glaucoma, left eye, severe stage: Secondary | ICD-10-CM | POA: Diagnosis not present

## 2018-04-01 DIAGNOSIS — H401111 Primary open-angle glaucoma, right eye, mild stage: Secondary | ICD-10-CM | POA: Diagnosis not present

## 2018-04-01 DIAGNOSIS — Z961 Presence of intraocular lens: Secondary | ICD-10-CM | POA: Diagnosis not present

## 2018-04-01 LAB — HM DIABETES EYE EXAM

## 2018-04-22 DIAGNOSIS — H26491 Other secondary cataract, right eye: Secondary | ICD-10-CM | POA: Diagnosis not present

## 2018-04-22 DIAGNOSIS — Z961 Presence of intraocular lens: Secondary | ICD-10-CM | POA: Diagnosis not present

## 2018-04-22 DIAGNOSIS — H401111 Primary open-angle glaucoma, right eye, mild stage: Secondary | ICD-10-CM | POA: Diagnosis not present

## 2018-05-06 ENCOUNTER — Other Ambulatory Visit: Payer: Self-pay | Admitting: Pediatrics

## 2018-05-06 DIAGNOSIS — K219 Gastro-esophageal reflux disease without esophagitis: Secondary | ICD-10-CM

## 2018-06-09 DIAGNOSIS — H26491 Other secondary cataract, right eye: Secondary | ICD-10-CM | POA: Diagnosis not present

## 2018-06-09 DIAGNOSIS — H401111 Primary open-angle glaucoma, right eye, mild stage: Secondary | ICD-10-CM | POA: Diagnosis not present

## 2018-06-09 DIAGNOSIS — Z961 Presence of intraocular lens: Secondary | ICD-10-CM | POA: Diagnosis not present

## 2018-06-30 ENCOUNTER — Ambulatory Visit (INDEPENDENT_AMBULATORY_CARE_PROVIDER_SITE_OTHER): Payer: Medicare Other | Admitting: Family

## 2018-06-30 ENCOUNTER — Encounter: Payer: Self-pay | Admitting: Family

## 2018-06-30 VITALS — BP 134/64 | HR 69 | Temp 98.0°F | Ht 60.0 in | Wt 192.6 lb

## 2018-06-30 DIAGNOSIS — I10 Essential (primary) hypertension: Secondary | ICD-10-CM | POA: Diagnosis not present

## 2018-06-30 DIAGNOSIS — R300 Dysuria: Secondary | ICD-10-CM

## 2018-06-30 DIAGNOSIS — F411 Generalized anxiety disorder: Secondary | ICD-10-CM | POA: Diagnosis not present

## 2018-06-30 DIAGNOSIS — K219 Gastro-esophageal reflux disease without esophagitis: Secondary | ICD-10-CM | POA: Diagnosis not present

## 2018-06-30 DIAGNOSIS — E119 Type 2 diabetes mellitus without complications: Secondary | ICD-10-CM

## 2018-06-30 LAB — MICROSCOPIC EXAMINATION
Epithelial Cells (non renal): >10 /hpf — AB (ref 0–10)
Renal Epithel, UA: NONE SEEN /hpf

## 2018-06-30 LAB — URINALYSIS, COMPLETE
BILIRUBIN UA: NEGATIVE
GLUCOSE, UA: NEGATIVE
KETONES UA: NEGATIVE
Leukocytes, UA: NEGATIVE
NITRITE UA: NEGATIVE
Protein, UA: NEGATIVE
RBC UA: NEGATIVE
Urobilinogen, Ur: 0.2 mg/dL (ref 0.2–1.0)
pH, UA: 5.5 (ref 5.0–7.5)

## 2018-06-30 LAB — BAYER DCA HB A1C WAIVED: HB A1C (BAYER DCA - WAIVED): 6.2 % (ref <7.0)

## 2018-06-30 MED ORDER — VALSARTAN-HYDROCHLOROTHIAZIDE 160-12.5 MG PO TABS: ORAL_TABLET | ORAL | 1 refills | Status: DC

## 2018-06-30 MED ORDER — OMEPRAZOLE 20 MG PO CPDR: 20.0000 mg | DELAYED_RELEASE_CAPSULE | Freq: Every day | ORAL | 1 refills | Status: DC

## 2018-06-30 NOTE — Progress Notes (Signed)
Subjective:    Patient ID: Robin Lindsey, female    DOB: [birth year], 72 y.o.   MRN: [id number]  Chief Complaint  Patient presents with  . Diabetes    recheck  . Hypertension  . Dysuria    HPI Diabetes  She presents for her follow-up diabetic visit. She has type 2 diabetes mellitus. Her disease course has been stable. Hypoglycemia symptoms include nervousness/anxiousness. Pertinent negatives for diabetes include no blurred vision, no chest pain and no foot paresthesias. Symptoms are stable. Pertinent negatives for diabetic complications include no CVA, heart disease, nephropathy or peripheral neuropathy. Risk factors for coronary artery disease include dyslipidemia, diabetes mellitus, female sex and sedentary lifestyle. She is following a generally healthy diet. Her overall blood glucose range is 110-130 mg/dl. Eye exam is current.  Hypertension  This is a chronic problem. The current episode started more than 1 year ago. The problem has been resolved since onset. The problem is controlled. Associated symptoms include anxiety. Pertinent negatives include no blurred vision, chest pain, peripheral edema or shortness of breath. Risk factors for coronary artery disease include dyslipidemia, diabetes mellitus, obesity and sedentary lifestyle. The current treatment provides moderate improvement. There is no history of CVA.  Dysuria  This is a new problem. The current episode started in the past 7 days. The problem occurs intermittently. The problem has been waxing and waning. The quality of the pain is described as burning. The pain is mild. Associated symptoms include nausea and urgency. Pertinent negatives include no flank pain or hematuria. She has tried increased fluids for the symptoms. The treatment provided mild relief.  Anxiety  Presents for follow-up visit. Symptoms include excessive worry, nausea and nervous/anxious behavior. Patient reports no chest pain, depressed mood, irritability or  shortness of breath. Symptoms occur most days. The severity of symptoms is moderate.  Gastroesophageal Reflux  She complains of nausea. She reports no chest pain, no coughing or no globus sensation. The current episode started more than 1 year ago. The problem occurs occasionally. The problem has been waxing and waning. She has tried a PPI for the symptoms. The treatment provided moderate relief.   Review of Systems  Constitutional: Negative for irritability.  Eyes: Negative for blurred vision.  Respiratory: Negative for cough and shortness of breath.  Cardiovascular: Negative for chest pain.  Gastrointestinal: Positive for nausea.  Genitourinary: Positive for dysuria and urgency. Negative for flank pain and hematuria.  Psychiatric/Behavioral: The patient is nervous/anxious.  All other systems reviewed and are negative.      Objective:   Physical Exam  Constitutional: She is oriented to person, place, and time. She appears well-developed and well-nourished. No distress.  HENT:  Head: Normocephalic and atraumatic.  Right Ear: External ear normal.  Left Ear: External ear normal.  Mouth/Throat: Oropharynx is clear and moist.  Eyes: Pupils are equal, round, and reactive to light.  Neck: Normal range of motion. Neck supple. No thyromegaly present.  Cardiovascular: Normal rate, regular rhythm, normal heart sounds and intact distal pulses.  No murmur heard. Pulmonary/Chest: Effort normal and breath sounds normal. No respiratory distress. She has no wheezes.  Abdominal: Soft. Bowel sounds are normal. She exhibits no distension. There is no tenderness.  Musculoskeletal: Normal range of motion. She exhibits tenderness (trace BLE). She exhibits no edema.  Neurological: She is alert and oriented to person, place, and time. She has normal reflexes. No cranial nerve deficit.  Skin: Skin is warm and dry.  Psychiatric: She has a normal mood and  affect. Her behavior is normal. Judgment and thought  content normal.  Vitals reviewed.   Diabetic Foot Exam - Simple   Simple Foot Form Diabetic Foot exam was performed with the following findings:  Yes 06/30/2018 12:45 PM  Visual Inspection No deformities, no ulcerations, no other skin breakdown bilaterally:  Yes Sensation Testing Intact to touch and monofilament testing bilaterally:  Yes Pulse Check Posterior Tibialis and Dorsalis pulse intact bilaterally:  Yes Comments      BP 134/64   Pulse 69   Temp 98 F (36.7 C) (Oral)   Ht 5' (1.524 m)   Wt 192 lb 9.6 oz (87.4 kg)   BMI 37.61 kg/m      Assessment & Plan:  Robin Lindsey comes in today with chief complaint of Diabetes (recheck); Hypertension; and Dysuria   Diagnosis and orders addressed:  1. Gastroesophageal reflux disease, esophagitis presence not specified - omeprazole (PRILOSEC) 20 MG capsule; Take 1 capsule (20 mg total) by mouth daily.  Dispense: 90 capsule; Refill: 1 - CMP14+EGFR  2. Essential hypertension - valsartan-hydrochlorothiazide (DIOVAN-HCT) 160-12.5 MG tablet; TAKE ONE (1) TABLET EACH DAY  Dispense: 90 tablet; Refill: 1 - CMP14+EGFR  3. Dysuria - Urinalysis, Complete - CMP14+EGFR - Urine Culture  4. Diabetes mellitus without complication ([place]) - Bayer DCA Hb A1c Waived - CMP14+EGFR - Lipid panel  5. GAD (generalized anxiety disorder) - CMP14+EGFR   Labs pending Health Maintenance reviewed Diet and exercise encouraged  Follow up plan:  6 months   Robin Dun, FNP

## 2018-06-30 NOTE — Patient Instructions (Signed)

## 2018-07-01 LAB — CMP14+EGFR
A/G RATIO: 1.7 (ref 1.2–2.2)
ALK PHOS: 69 IU/L (ref 39–117)
ALT: 10 IU/L (ref 0–32)
AST: 11 IU/L (ref 0–40)
Albumin: 3.7 g/dL (ref 3.5–4.8)
BUN/Creatinine Ratio: 15 (ref 12–28)
BUN: 15 mg/dL (ref 8–27)
Bilirubin Total: 0.3 mg/dL (ref 0.0–1.2)
CALCIUM: 8.8 mg/dL (ref 8.7–10.3)
CO2: 24 mmol/L (ref 20–29)
CREATININE: 0.99 mg/dL (ref 0.57–1.00)
Chloride: 102 mmol/L (ref 96–106)
GFR calc Af Amer: 65 mL/min/{1.73_m2} (ref >59)
GFR calc non Af Amer: 56 mL/min/{1.73_m2} — ABNORMAL LOW (ref >59)
GLOBULIN, TOTAL: 2.2 g/dL (ref 1.5–4.5)
Glucose: 95 mg/dL (ref 65–99)
POTASSIUM: 4.2 mmol/L (ref 3.5–5.2)
SODIUM: 140 mmol/L (ref 134–144)
Total Protein: 5.9 g/dL — ABNORMAL LOW (ref 6.0–8.5)

## 2018-07-01 LAB — LIPID PANEL
CHOLESTEROL TOTAL: 152 mg/dL (ref 100–199)
Chol/HDL Ratio: 2.4 ratio (ref 0.0–4.4)
HDL: 63 mg/dL (ref >39)
LDL Calculated: 72 mg/dL (ref 0–99)
TRIGLYCERIDES: 85 mg/dL (ref 0–149)
VLDL Cholesterol Cal: 17 mg/dL (ref 5–40)

## 2018-07-03 ENCOUNTER — Other Ambulatory Visit: Payer: Self-pay | Admitting: Family

## 2018-07-03 LAB — URINE CULTURE

## 2018-07-03 MED ORDER — CEPHALEXIN 500 MG PO CAPS: 500.0000 mg | ORAL_CAPSULE | Freq: Two times a day (BID) | ORAL | 0 refills | Status: DC

## 2018-08-14 ENCOUNTER — Encounter: Payer: Self-pay | Admitting: *Deleted

## 2018-08-14 ENCOUNTER — Ambulatory Visit (INDEPENDENT_AMBULATORY_CARE_PROVIDER_SITE_OTHER): Payer: Medicare Other | Admitting: *Deleted

## 2018-08-14 VITALS — BP 119/59 | HR 78 | Ht <= 58 in | Wt 196.0 lb

## 2018-08-14 DIAGNOSIS — Z0000 Encounter for general adult medical examination without abnormal findings: Secondary | ICD-10-CM

## 2018-08-14 NOTE — Progress Notes (Addendum)
Subjective:   Robin Lindsey is a 72 y.o. female who presents for a Medicare Annual Wellness Visit. Dejane lives at home with her husband. One of her daughters lives with them as well. She has two adult daughters and one son and one 42 year old grandson.  She does not do any formal exercise but does do work around her home and yard.    Review of Systems    Patient reports that her overall health is unchanged compared to last year.  Cardiac Risk Factors include: advanced age (>1men, >42 women);hypertension;sedentary lifestyle;obesity (BMI >30kg/m2)  GI: Lactose intolerance and sensitivity to many preservatives. Frequent gas and bloating and some loose stools after eating offending foods. Normal Colonoscopy a couple of years ago but no upper GI workup.   Pulmonary: some shortness of breath with exertion. Family hx of pulmonary fibrosis but no personal hx of pulmonary disease that she is aware of.   HEENT: glaucoma-sees eye doctor regularly. Using eye drops for pressure. Stopped using dorzolamide 2% about a week ago because she was concerned about side effects. States that she will call her eye doctor and let him know.   All other systems negative       Current Medications (verified) Outpatient Encounter Medications as of 08/14/2018  Medication Sig  . betaxolol (BETOPTIC-S) 0.5 % ophthalmic suspension Place into both eyes 2 (two) times daily.   . bimatoprost (LUMIGAN) 0.03 % ophthalmic solution 1 drop at bedtime.  Marland Kitchen glucose blood (BAYER CONTOUR NEXT TEST) test strip Use to check BG daily  . omeprazole (PRILOSEC) 20 MG capsule Take 1 capsule (20 mg total) by mouth daily.  . valsartan-hydrochlorothiazide (DIOVAN-HCT) 160-12.5 MG tablet TAKE ONE (1) TABLET EACH DAY  . BAYER MICROLET LANCETS lancets Use to check BG daily  . cephALEXin (KEFLEX) 500 MG capsule Take 1 capsule (500 mg total) by mouth 2 (two) times daily.  . dorzolamide (TRUSOPT) 2 % ophthalmic solution Place 1 drop into  both eyes 2 (two) times daily.   No facility-administered encounter medications on file as of 08/14/2018.     Allergies (verified) Metformin and related; Aspirin; and Nsaids   History: Past Medical History:  Diagnosis Date  . Arthritis   . Diabetes mellitus without complication ([place])   . GERD (gastroesophageal reflux disease)   . Glaucoma    worse in left eye than right eye  . Heel pain    right heel pain  . Hypertension   . Personal history of colonic polyps 12/09/2012  . Plantar fasciitis    right foot   Past Surgical History:  Procedure Laterality Date  . BREAST LUMPECTOMY  10 + yrs.   left breast/benign  . CATARACT EXTRACTION W/PHACO  02/14/2012   Procedure: CATARACT EXTRACTION PHACO AND INTRAOCULAR LENS PLACEMENT (IOC);  Surgeon: Tonny Branch, MD;  Location: [facility];  Service: Ophthalmology;  Laterality: Left;  CDE 13.73  . CATARACT EXTRACTION W/PHACO  03/03/2012   Procedure: CATARACT EXTRACTION PHACO AND INTRAOCULAR LENS PLACEMENT (IOC);  Surgeon: Tonny Branch, MD;  Location: [facility];  Service: Ophthalmology;  Laterality: Right;  CDE: 16.49  . COLONOSCOPY    . percutaneous micrfasciotomy with topaz coblation wand  12/2015   Family History  Problem Relation Age of Onset  . Colon polyps Mother   . Diabetes Mother   . Pulmonary fibrosis Mother   . Colon cancer Maternal Aunt   . Pulmonary fibrosis Maternal Aunt   . Heart disease Father   . Stroke  Father   . Dementia Father   . Lung disease Father        realted to asbestos exposure  . Thyroid disease Brother   . Diabetes Brother   . Hypertension Brother   . Meniere's disease Brother   . Pulmonary fibrosis Maternal Aunt   . Anesthesia problems Neg Hx   . Hypotension Neg Hx   . Malignant hyperthermia Neg Hx   . Pseudochol deficiency Neg Hx    Social History   Socioeconomic History  . Marital status: Married    Spouse name: Not on file  . Number of children: 3  . Years of education: 56  . Highest education level:  12th grade  Occupational History  . Occupation: retired  Scientific laboratory technician  . Financial resource strain: Not hard at all  . Food insecurity:    Worry: Never true    Inability: Never true  . Transportation needs:    Medical: No    Non-medical: No  Tobacco Use  . Smoking status: Former Smoker    Packs/day: 1.00    Years: 5.00    Pack years: 5.00    Types: Cigarettes    Start date: 03/10/1962    Last attempt to quit: 03/11/1967    Years since quitting: 51.4  . Smokeless tobacco: Never Used  . Tobacco comment: smoked for 4-5 years from age 32- 59 years old  Substance and Sexual Activity  . Alcohol use: No    Alcohol/week: 0.0 standard drinks  . Drug use: No  . Sexual activity: Never  Lifestyle  . Physical activity:    Days per week: 0 days    Minutes per session: 0 min  . Stress: Not at all  Relationships  . Social connections:    Talks on phone: More than three times a week    Gets together: More than three times a week    Attends religious service: More than 4 times per year    Active member of club or organization: No    Attends meetings of clubs or organizations: Never    Relationship status: Married  Other Topics Concern  . Not on file  Social History Narrative  . Not on file    Tobacco Use No.  Clinical Intake:     Pain : No/denies pain     Nutritional Status: BMI > 30  Obese Diabetes: No  How often do you need to have someone help you when you read instructions, pamphlets, or other written materials from your doctor or pharmacy?: 1 - Never What is the last grade level you completed in school?: 12     Information entered by :: Chong Sicilian, RN   Activities of Daily Living In your present state of health, do you have any difficulty performing the following activities: 08/14/2018  Hearing? N  Vision? 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 Sees specialist regularly  Difficulty concentrating or making decisions? N  Walking or climbing stairs? N  Dressing or bathing? N    Doing errands, shopping? N  Preparing Food and eating ? N  Using the Toilet? N  In the past six months, have you accidently leaked urine? N  Do you have problems with loss of bowel control? N  Managing your Medications? N  Managing your Finances? N  Housekeeping or managing your Housekeeping? N  Some recent data might be hidden     Diet 3 meals a day. Prepares most meals at home but does eat out some. Has to  be careful with eating certain foods due to it upsetting her stomach Drinks mostly water, tea, and 2 cans of diet [facility] a day  Exercise Current Exercise Habits: The patient does not participate in regular exercise at present, Exercise limited by: orthopedic condition(s)   Depression Screen PHQ 2/9 Scores 08/14/2018 06/30/2018 03/07/2018 03/03/2018 08/15/2017 07/05/2017 05/23/2017  PHQ - 2 Score 0 0 0 0 0 1 0     Fall Risk Fall Risk  08/14/2018 06/30/2018 03/07/2018 03/03/2018 08/15/2017  Falls in the past year? No No No No Yes  Number falls in past yr: - - - - 1  Injury with Fall? - - - - No  Risk for fall due to : - - - - -  Follow up - - - - -    Safety Is the patient's home free of loose throw rugs in walkways, pet beds, electrical cords, etc?   yes      Grab bars in the bathroom? no      Walkin shower? yes      Shower Seat? no      Handrails on the stairs?   yes      Adequate lighting?   yes  Patient Care Team: Sharion Balloon, FNP as PCP - General (Family Medicine) Clent Jacks, MD as Consulting Physician (Ophthalmology) Steffanie Rainwater, DPM as Consulting Physician (Podiatry) Maisie Fus, MD as Consulting Physician (Obstetrics and Gynecology)  Hospitalizations, surgeries, and ER visits in previous 12 months No hospitalizations, ER visits, or surgeries this past year.   Objective:    Today's Vitals   08/14/18 1422  BP: (!) 119/59  Pulse: 78  Weight: 196 lb (88.9 kg)  Height: 4\' 10"  (1.473 m)   Body mass index is 40.96 kg/m.  Advanced Directives  08/14/2018 05/21/2016 04/04/2016 02/11/2012  Does Patient Have a Medical Advance Directive? No No Yes Patient does not have advance directive;Patient would not like information  Type of Advance Directive - - [place] -  Would patient like information on creating a medical advance directive? No - Patient declined Yes - Educational materials given - -    Hearing/Vision  No hearing or vision deficits noted during visit.  Cognitive Function: MMSE - Mini Mental State Exam 08/14/2018 05/21/2016  Orientation to time 5 5  Orientation to Place 5 5  Registration 3 3  Attention/ Calculation 5 5  Recall 3 2  Language- name 2 objects 2 2  Language- repeat 1 1  Language- follow 3 step command 3 3  Language- read & follow direction 1 1  Write a sentence 1 1  Copy design 1 1  Total score 30 29       Normal Cognitive Function Screening: Yes    Immunizations and Health Maintenance Immunization History  Administered Date(s) Administered  . Influenza, High Dose Seasonal PF 10/01/2016, 09/04/2017  . Influenza,inj,Quad PF,6+ Mos 10/06/2013, 09/22/2014  . Influenza-Unspecified 10/10/2015  . Pneumococcal Conjugate-13 10/06/2013  . Pneumococcal Polysaccharide-23 02/16/2016  . Zoster 04/21/2014   Health Maintenance Due  Topic Date Due  . MAMMOGRAM  12/07/2017  . INFLUENZA VACCINE  07/10/2018   Health Maintenance  Topic Date Due  . MAMMOGRAM  12/07/2017  . INFLUENZA VACCINE  07/10/2018  . TETANUS/TDAP  08/15/2019 (Originally 03/11/1965)  . DEXA SCAN  08/22/2018  . HEMOGLOBIN A1C  12/31/2018  . OPHTHALMOLOGY EXAM  04/02/2019  . FOOT EXAM  07/01/2019  . COLONOSCOPY  05/02/2021  . Hepatitis C Screening  Completed  .  PNA vac Low Risk Adult  Completed        Assessment:   This is a routine wellness examination for Marely.    Plan:    Goals    . Exercise 150 min/wk Moderate Activity        Health Maintenance Recommendations: Td vaccine Bone Density scan at next  visit   Additional Screening Recommendations: Lung: Low Dose CT Chest recommended if Age 58-80 years, 30 pack-year currently smoking OR have quit w/in 15years. Patient does not qualify. Hepatitis C Screening recommended: no   Keep f/u with Evelina Dun, FNP and any other specialty appointments you may have Follow up sooner if necessary Continue current medications Move carefully to avoid falls. Use assistive devices like a cane or walker if needed. Increase activity level as tolerated. Aim for at least 150 minutes of moderate activity a week. This can be done with chair exercises if necessary. Read or work on puzzles daily Stay connected with friends and family  I have personally reviewed and noted the following in the patient's chart:   . Medical and social history . Use of alcohol, tobacco or illicit drugs  . Current medications and supplements . Functional ability and status . Nutritional status . Physical activity . Advanced directives . List of other physicians . Hospitalizations, surgeries, and ER visits in previous 12 months . Vitals . Screenings to include cognitive, depression, and falls . Referrals and appointments  In addition, I have reviewed and discussed with patient certain preventive protocols, quality metrics, and best practice recommendations. A written personalized care plan for preventive services as well as general preventive health recommendations were provided to patient.     Chong Sicilian, RN   08/14/2018    I have reviewed and agree with the above AWV documentation.   Evelina Dun, FNP

## 2018-08-14 NOTE — Patient Instructions (Signed)
  Robin Lindsey , Thank you for taking time to come for your Medicare Wellness Visit. I appreciate your ongoing commitment to your health goals. Please review the following plan we discussed and let me know if I can assist you in the future.   These are the goals we discussed: Goals    . Exercise 150 min/wk Moderate Activity       This is a list of the screening recommended for you and due dates:  Health Maintenance  Topic Date Due  . Mammogram  12/07/2017  . Flu Shot  07/10/2018  . Tetanus Vaccine  08/15/2019*  . DEXA scan (bone density measurement)  08/22/2018  . Hemoglobin A1C  12/31/2018  . Eye exam for diabetics  04/02/2019  . Complete foot exam   07/01/2019  . Colon Cancer Screening  05/02/2021  .  Hepatitis C: One time screening is recommended by Center for Disease Control  (CDC) for  adults born from [birth year] through 1967.   Completed  . Pneumonia vaccines  Completed  *Topic was postponed. The date shown is not the original due date.

## 2018-10-13 DIAGNOSIS — E119 Type 2 diabetes mellitus without complications: Secondary | ICD-10-CM | POA: Diagnosis not present

## 2018-10-13 DIAGNOSIS — Z961 Presence of intraocular lens: Secondary | ICD-10-CM | POA: Diagnosis not present

## 2018-10-13 DIAGNOSIS — H401111 Primary open-angle glaucoma, right eye, mild stage: Secondary | ICD-10-CM | POA: Diagnosis not present

## 2018-10-13 DIAGNOSIS — H26491 Other secondary cataract, right eye: Secondary | ICD-10-CM | POA: Diagnosis not present

## 2018-10-23 ENCOUNTER — Ambulatory Visit (INDEPENDENT_AMBULATORY_CARE_PROVIDER_SITE_OTHER): Payer: Medicare Other

## 2018-10-23 DIAGNOSIS — Z23 Encounter for immunization: Secondary | ICD-10-CM | POA: Diagnosis not present

## 2018-11-19 ENCOUNTER — Other Ambulatory Visit: Payer: Self-pay | Admitting: Family

## 2018-11-19 DIAGNOSIS — I10 Essential (primary) hypertension: Secondary | ICD-10-CM

## 2019-01-01 ENCOUNTER — Encounter: Payer: Self-pay | Admitting: Family

## 2019-01-01 ENCOUNTER — Ambulatory Visit (INDEPENDENT_AMBULATORY_CARE_PROVIDER_SITE_OTHER): Payer: Medicare Other | Admitting: Family

## 2019-01-01 VITALS — BP 133/75 | HR 72 | Temp 98.3°F | Ht <= 58 in | Wt 200.8 lb

## 2019-01-01 DIAGNOSIS — F411 Generalized anxiety disorder: Secondary | ICD-10-CM | POA: Diagnosis not present

## 2019-01-01 DIAGNOSIS — E559 Vitamin D deficiency, unspecified: Secondary | ICD-10-CM | POA: Diagnosis not present

## 2019-01-01 DIAGNOSIS — Z6841 Body Mass Index (BMI) 40.0 and over, adult: Secondary | ICD-10-CM

## 2019-01-01 DIAGNOSIS — M159 Polyosteoarthritis, unspecified: Secondary | ICD-10-CM | POA: Diagnosis not present

## 2019-01-01 DIAGNOSIS — K219 Gastro-esophageal reflux disease without esophagitis: Secondary | ICD-10-CM

## 2019-01-01 DIAGNOSIS — E119 Type 2 diabetes mellitus without complications: Secondary | ICD-10-CM | POA: Diagnosis not present

## 2019-01-01 DIAGNOSIS — I10 Essential (primary) hypertension: Secondary | ICD-10-CM

## 2019-01-01 LAB — CBC WITH DIFFERENTIAL/PLATELET
Basophils Absolute: 0 10*3/uL (ref 0.0–0.2)
Basos: 1 %
EOS (ABSOLUTE): 0.2 10*3/uL (ref 0.0–0.4)
EOS: 3 %
HEMOGLOBIN: 12.9 g/dL (ref 11.1–15.9)
Hematocrit: 38.7 % (ref 34.0–46.6)
IMMATURE GRANS (ABS): 0 10*3/uL (ref 0.0–0.1)
IMMATURE GRANULOCYTES: 0 %
LYMPHS: 35 %
Lymphocytes Absolute: 2.6 10*3/uL (ref 0.7–3.1)
MCH: 31.1 pg (ref 26.6–33.0)
MCHC: 33.3 g/dL (ref 31.5–35.7)
MCV: 93 fL (ref 79–97)
MONOCYTES: 7 %
Monocytes Absolute: 0.5 10*3/uL (ref 0.1–0.9)
NEUTROS PCT: 54 %
Neutrophils Absolute: 4.1 10*3/uL (ref 1.4–7.0)
PLATELETS: 236 10*3/uL (ref 150–450)
RBC: 4.15 x10E6/uL (ref 3.77–5.28)
RDW: 12.9 % (ref 11.7–15.4)
WBC: 7.5 10*3/uL (ref 3.4–10.8)

## 2019-01-01 LAB — CMP14+EGFR
ALT: 14 IU/L (ref 0–32)
AST: 18 IU/L (ref 0–40)
Albumin/Globulin Ratio: 1.8 (ref 1.2–2.2)
Albumin: 3.8 g/dL (ref 3.7–4.7)
Alkaline Phosphatase: 77 IU/L (ref 39–117)
BUN/Creatinine Ratio: 13 (ref 12–28)
BUN: 13 mg/dL (ref 8–27)
Bilirubin Total: 0.2 mg/dL (ref 0.0–1.2)
CALCIUM: 9.1 mg/dL (ref 8.7–10.3)
CO2: 25 mmol/L (ref 20–29)
CREATININE: 1 mg/dL (ref 0.57–1.00)
Chloride: 104 mmol/L (ref 96–106)
GFR calc Af Amer: 64 mL/min/{1.73_m2} (ref >59)
GFR, EST NON AFRICAN AMERICAN: 56 mL/min/{1.73_m2} — AB (ref >59)
GLUCOSE: 100 mg/dL — AB (ref 65–99)
Globulin, Total: 2.1 g/dL (ref 1.5–4.5)
POTASSIUM: 4.3 mmol/L (ref 3.5–5.2)
Sodium: 141 mmol/L (ref 134–144)
TOTAL PROTEIN: 5.9 g/dL — AB (ref 6.0–8.5)

## 2019-01-01 LAB — LIPID PANEL
Chol/HDL Ratio: 2.4 ratio (ref 0.0–4.4)
Cholesterol, Total: 160 mg/dL (ref 100–199)
HDL: 66 mg/dL (ref >39)
LDL Calculated: 65 mg/dL (ref 0–99)
Triglycerides: 147 mg/dL (ref 0–149)
VLDL Cholesterol Cal: 29 mg/dL (ref 5–40)

## 2019-01-01 LAB — BAYER DCA HB A1C WAIVED: HB A1C: 6.3 % (ref <7.0)

## 2019-01-01 MED ORDER — MELOXICAM 7.5 MG PO TABS: 7.5000 mg | ORAL_TABLET | Freq: Every day | ORAL | 0 refills | Status: DC

## 2019-01-01 MED ORDER — OMEPRAZOLE 20 MG PO CPDR: 20.0000 mg | DELAYED_RELEASE_CAPSULE | Freq: Two times a day (BID) | ORAL | 1 refills | Status: DC

## 2019-01-01 MED ORDER — VALSARTAN-HYDROCHLOROTHIAZIDE 160-12.5 MG PO TABS: 1.0000 | ORAL_TABLET | Freq: Every day | ORAL | 3 refills | Status: DC

## 2019-01-01 NOTE — Patient Instructions (Signed)

## 2019-01-01 NOTE — Progress Notes (Signed)
Subjective:    Patient ID: Robin Lindsey, female    DOB: [birth year], 73 y.o.   MRN: [id number] Chief Complaint  Patient presents with  . Medical Management of Chronic Issues    six month recheck    Pt presents to the office today for chronic follow up.  Hypertension  This is a chronic problem. The current episode started more than 1 year ago. The problem is controlled. Associated symptoms include anxiety and blurred vision. Pertinent negatives include no malaise/fatigue, peripheral edema or shortness of breath. Risk factors for coronary artery disease include diabetes mellitus, obesity and sedentary lifestyle. The current treatment provides moderate improvement. There is no history of CVA.  Gastroesophageal Reflux  She complains of heartburn. She reports no belching or no coughing. This is a chronic problem. Pertinent negatives include no fatigue. Risk factors include obesity. She has tried a PPI for the symptoms. The treatment provided moderate relief.  Diabetes  She presents for her follow-up diabetic visit. She has type 2 diabetes mellitus. Her disease course has been stable. Hypoglycemia symptoms include nervousness/anxiousness. Associated symptoms include blurred vision. Pertinent negatives for diabetes include no fatigue, no foot paresthesias and no visual change. Symptoms are stable. Pertinent negatives for diabetic complications include no CVA, heart disease or nephropathy. Risk factors for coronary artery disease include dyslipidemia and diabetes mellitus. She is following a generally healthy diet. Her overall blood glucose range is 90-110 mg/dl. Eye exam is not current.  Arthritis  Presents for follow-up visit. She complains of pain and stiffness. Affected locations include the right MCP, left MCP, left foot and right foot. Her pain is at a severity of 4/10. Pertinent negatives include no fatigue.  Anxiety  Presents for follow-up visit. Symptoms include depressed mood, excessive worry,  irritability and nervous/anxious behavior. Patient reports no shortness of breath. Symptoms occur occasionally. The severity of symptoms is moderate.        Review of Systems  Constitutional: Positive for irritability. Negative for fatigue and malaise/fatigue.  Eyes: Positive for blurred vision.  Respiratory: Negative for cough and shortness of breath.   Gastrointestinal: Positive for heartburn.  Musculoskeletal: Positive for arthritis and stiffness.  Psychiatric/Behavioral: The patient is nervous/anxious.   All other systems reviewed and are negative.      Objective:   Physical Exam Vitals signs reviewed.  Constitutional:      General: She is not in acute distress.    Appearance: She is well-developed. She is obese.  HENT:     Head: Normocephalic and atraumatic.     Right Ear: Tympanic membrane normal.     Left Ear: Tympanic membrane normal.  Eyes:     Pupils: Pupils are equal, round, and reactive to light.  Neck:     Musculoskeletal: Normal range of motion and neck supple.     Thyroid: No thyromegaly.  Cardiovascular:     Rate and Rhythm: Normal rate and regular rhythm.     Heart sounds: Normal heart sounds. No murmur.  Pulmonary:     Effort: Pulmonary effort is normal. No respiratory distress.     Breath sounds: Normal breath sounds. No wheezing.  Abdominal:     General: Bowel sounds are normal. There is no distension.     Palpations: Abdomen is soft.     Tenderness: There is no abdominal tenderness.  Musculoskeletal: Normal range of motion.        General: No tenderness.  Skin:    General: Skin is warm and dry.  Neurological:  Mental Status: She is alert and oriented to person, place, and time.     Cranial Nerves: No cranial nerve deficit.     Deep Tendon Reflexes: Reflexes are normal and symmetric.  Psychiatric:        Behavior: Behavior normal.        Thought Content: Thought content normal.        Judgment: Judgment normal.       BP 133/75    Pulse 72   Temp 98.3 F (36.8 C) (Oral)   Ht 4' 10"  (1.473 m)   Wt 200 lb 12.8 oz (91.1 kg)   BMI 41.97 kg/m      Assessment & Plan:  [name] comes in today with chief complaint of Medical Management of Chronic Issues (six month recheck)   Diagnosis and orders addressed:  1. Essential hypertension - valsartan-hydrochlorothiazide (DIOVAN-HCT) 160-12.5 MG tablet; Take 1 tablet by mouth daily.  Dispense: 90 tablet; Refill: 3 - CMP14+EGFR - CBC with Differential/Platelet  2. Gastroesophageal reflux disease, esophagitis presence not specified Will increase prilosec to 20 mg BID from 20 mg daily -Diet discussed- Avoid fried, spicy, citrus foods, caffeine and alcohol -Do not eat 2-3 hours before bedtime -Encouraged small frequent meals -RTO prn  - omeprazole (PRILOSEC) 20 MG capsule; Take 1 capsule (20 mg total) by mouth 2 (two) times daily before a meal.  Dispense: 180 capsule; Refill: 1 - CMP14+EGFR - CBC with Differential/Platelet  3. Diabetes mellitus without complication ([place]) - Bayer DCA Hb A1c Waived - CMP14+EGFR - CBC with Differential/Platelet - Microalbumin / creatinine urine ratio - Lipid panel  4. GAD (generalized anxiety disorder) - CMP14+EGFR - CBC with Differential/Platelet  5. Vitamin D deficiency - CMP14+EGFR - CBC with Differential/Platelet  6. Morbid obesity ([place]) - CMP14+EGFR - CBC with Differential/Platelet  7. Osteoarthritis of multiple joints, unspecified osteoarthritis type Will try low dose Mobic today  We have increase  PPI to BID as this may help with tolerating NSAID's - meloxicam (MOBIC) 7.5 MG tablet; Take 1 tablet (7.5 mg total) by mouth daily.  Dispense: 30 tablet; Refill: 0 - CMP14+EGFR - CBC with Differential/Platelet   Labs pending Health Maintenance reviewed Diet and exercise encouraged  Follow up plan: 6 months    Evelina Dun, FNP

## 2019-01-02 ENCOUNTER — Other Ambulatory Visit: Payer: Self-pay | Admitting: Family

## 2019-01-02 LAB — HM MAMMOGRAPHY

## 2019-01-02 LAB — MICROALBUMIN / CREATININE URINE RATIO: Creatinine, Urine: 62.5 mg/dL

## 2019-01-02 MED ORDER — ATORVASTATIN CALCIUM 20 MG PO TABS: 20.0000 mg | ORAL_TABLET | Freq: Every day | ORAL | 3 refills | Status: DC

## 2019-01-05 DIAGNOSIS — Z1231 Encounter for screening mammogram for malignant neoplasm of breast: Secondary | ICD-10-CM | POA: Diagnosis not present

## 2019-01-05 DIAGNOSIS — Z803 Family history of malignant neoplasm of breast: Secondary | ICD-10-CM | POA: Diagnosis not present

## 2019-02-05 ENCOUNTER — Other Ambulatory Visit: Payer: Self-pay | Admitting: Family

## 2019-02-05 DIAGNOSIS — M159 Polyosteoarthritis, unspecified: Secondary | ICD-10-CM

## 2019-02-11 DIAGNOSIS — H401123 Primary open-angle glaucoma, left eye, severe stage: Secondary | ICD-10-CM | POA: Diagnosis not present

## 2019-02-11 DIAGNOSIS — Z961 Presence of intraocular lens: Secondary | ICD-10-CM | POA: Diagnosis not present

## 2019-02-11 DIAGNOSIS — E119 Type 2 diabetes mellitus without complications: Secondary | ICD-10-CM | POA: Diagnosis not present

## 2019-02-11 DIAGNOSIS — H401111 Primary open-angle glaucoma, right eye, mild stage: Secondary | ICD-10-CM | POA: Diagnosis not present

## 2019-02-23 ENCOUNTER — Other Ambulatory Visit: Payer: Self-pay | Admitting: Optometry

## 2019-02-23 DIAGNOSIS — D2330 Other benign neoplasm of skin of unspecified part of face: Secondary | ICD-10-CM | POA: Diagnosis not present

## 2019-02-23 DIAGNOSIS — L720 Epidermal cyst: Secondary | ICD-10-CM | POA: Diagnosis not present

## 2019-04-10 ENCOUNTER — Ambulatory Visit (INDEPENDENT_AMBULATORY_CARE_PROVIDER_SITE_OTHER): Payer: Medicare Other | Admitting: Family

## 2019-04-10 ENCOUNTER — Other Ambulatory Visit: Payer: Self-pay

## 2019-04-10 ENCOUNTER — Encounter: Payer: Self-pay | Admitting: Family

## 2019-04-10 DIAGNOSIS — E1169 Type 2 diabetes mellitus with other specified complication: Secondary | ICD-10-CM | POA: Diagnosis not present

## 2019-04-10 DIAGNOSIS — R0602 Shortness of breath: Secondary | ICD-10-CM | POA: Diagnosis not present

## 2019-04-10 DIAGNOSIS — Z836 Family history of other diseases of the respiratory system: Secondary | ICD-10-CM | POA: Diagnosis not present

## 2019-04-10 NOTE — Progress Notes (Signed)
   Virtual Visit via telephone Note  I connected with Robin Lindsey on 04/10/19 at 3:05 pm by telephone and verified that I am speaking with the correct person using two identifiers. Robin Lindsey is currently located at home  and no one is currently with her during visit. The provider, Evelina Dun, FNP is located in their office at time of visit.  I discussed the limitations, risks, security and privacy concerns of performing an evaluation and management service by telephone and the availability of in person appointments. I also discussed with the patient that there may be a patient responsible charge related to this service. The patient expressed understanding and agreed to proceed.   History and Present Illness:  PT  Calls to day today for ongoing SOB. States this has been going on for years,but has progressively become worse. She states she gets SOB with any exertion or exercise.  She states her mother had Pulmonary Fibrosis and worries this could be causing her SOB or that it is her heart.  Shortness of Breath  This is a chronic problem. The current episode started more than 1 year ago. The problem occurs intermittently (with walking). The problem has been waxing and waning. Pertinent negatives include no chest pain, claudication, ear pain, fever, leg pain, leg swelling, orthopnea, sore throat, sputum production or swollen glands. The symptoms are aggravated by any activity and exercise. She has tried rest for the symptoms. The treatment provided moderate relief.      Review of Systems  Constitutional: Negative for fever.  HENT: Negative for ear pain and sore throat.   Respiratory: Positive for shortness of breath. Negative for sputum production.   Cardiovascular: Negative for chest pain, orthopnea, claudication and leg swelling.     Observations/Objective: No SOB or distress noted, BP this morning 143/66, HR-68, Glucose 102  Assessment and Plan: 1. SOB (shortness of breath)  on exertion - Ambulatory referral to Cardiology  2. Type 2 diabetes mellitus with other specified complication, without long-term current use of insulin ([place]) - Ambulatory referral to Cardiology  Will do referral to Cardiologists today  She will come to office next week and we will do EKG, chest x-ray, and lab work before her Cardiologists visit Discussed encouraged weight loss and deconditioning   RTO next week for face to face appt   I discussed the assessment and treatment plan with the patient. The patient was provided an opportunity to ask questions and all were answered. The patient agreed with the plan and demonstrated an understanding of the instructions.   The patient was advised to call back or seek an in-person evaluation if the symptoms worsen or if the condition fails to improve as anticipated.  The above assessment and management plan was discussed with the patient. The patient verbalized understanding of and has agreed to the management plan. Patient is aware to call the clinic if symptoms persist or worsen. Patient is aware when to return to the clinic for a follow-up visit. Patient educated on when it is appropriate to go to the emergency department.   Time call ended: 3:20 pm    I provided  15 minutes of non-face-to-face time during this encounter.    Evelina Dun, FNP

## 2019-04-14 ENCOUNTER — Telehealth: Payer: Self-pay | Admitting: Cardiovascular Disease

## 2019-04-14 NOTE — Telephone Encounter (Signed)
Virtual Visit Pre-Appointment Phone Call  "(Name), I am calling you today to discuss your upcoming appointment. We are currently trying to limit exposure to the virus that causes COVID-19 by seeing patients at home rather than in the office."  1. "What is the BEST phone number to call the day of the visit?" - include this in appointment notes  2. Do you have or have access to (through a family member/friend) a smartphone with video capability that we can use for your visit?" a. If yes - list this number in appt notes as cell (if different from BEST phone #) and list the appointment type as a VIDEO visit in appointment notes b. If no - list the appointment type as a PHONE visit in appointment notes  3. Confirm consent - "In the setting of the current Covid19 crisis, you are scheduled for a (phone or video) visit with your provider on (date) at (time).  Just as we do with many in-office visits, in order for you to participate in this visit, we must obtain consent.  If you'd like, I can send this to your mychart (if signed up) or email for you to review.  Otherwise, I can obtain your verbal consent now.  All virtual visits are billed to your insurance company just like a normal visit would be.  By agreeing to a virtual visit, we'd like you to understand that the technology does not allow for your provider to perform an examination, and thus may limit your provider's ability to fully assess your condition. If your provider identifies any concerns that need to be evaluated in person, we will make arrangements to do so.  Finally, though the technology is pretty good, we cannot assure that it will always work on either your or our end, and in the setting of a video visit, we may have to convert it to a phone-only visit.  In either situation, we cannot ensure that we have a secure connection.  Are you willing to proceed?" STAFF: Did the patient verbally acknowledge consent to telehealth visit? Document  YES/NO here: yes  4. Advise patient to be prepared - "Two hours prior to your appointment, go ahead and check your blood pressure, pulse, oxygen saturation, and your weight (if you have the equipment to check those) and write them all down. When your visit starts, your provider will ask you for this information. If you have an Apple Watch or Kardia device, please plan to have heart rate information ready on the day of your appointment. Please have a pen and paper handy nearby the day of the visit as well."  5. Give patient instructions for MyChart download to smartphone OR Doximity/Doxy.me as below if video visit (depending on what platform provider is using)  6. Inform patient they will receive a phone call 15 minutes prior to their appointment time (may be from unknown caller ID) so they should be prepared to answer    TELEPHONE CALL NOTE  Robin Lindsey has been deemed a candidate for a follow-up tele-health visit to limit community exposure during the Covid-19 pandemic. I spoke with the patient via phone to ensure availability of phone/video source, confirm preferred email & phone number, and discuss instructions and expectations.  I reminded Robin Lindsey to be prepared with any vital sign and/or heart rhythm information that could potentially be obtained via home monitoring, at the time of her visit. I reminded Robin Lindsey to expect a phone call prior to  her visit.  [name] 04/14/2019 12:34 PM   INSTRUCTIONS FOR DOWNLOADING THE MYCHART APP TO SMARTPHONE  - The patient must first make sure to have activated MyChart and know their login information - If Apple, go to [employer] and type in MyChart in the search bar and download the app. If Android, ask patient to go to [employer] and type in [place] in the search bar and download the app. The app is free but as with any other app downloads, their phone may require them to verify saved payment information or Apple/Android  password.  - The patient will need to then log into the app with their MyChart username and password, and select [facility] as their healthcare provider to link the account. When it is time for your visit, go to the MyChart app, find appointments, and click Begin Video Visit. Be sure to Select Allow for your device to access the Microphone and Camera for your visit. You will then be connected, and your provider will be with you shortly.  **If they have any issues connecting, or need assistance please contact MyChart service desk [phone])**  **If using a computer, in order to ensure the best quality for their visit they will need to use either of the following Internet Browsers: [employer], or Google Chrome**  IF USING DOXIMITY or DOXY.ME - The patient will receive a link just prior to their visit by text.     FULL LENGTH CONSENT FOR TELE-HEALTH VISIT   I hereby voluntarily request, consent and authorize [facility] and its employed or contracted physicians, physician assistants, nurse practitioners or other licensed health care professionals (the Practitioner), to provide me with telemedicine health care services (the Services") as deemed necessary by the treating Practitioner. I acknowledge and consent to receive the Services by the Practitioner via telemedicine. I understand that the telemedicine visit will involve communicating with the Practitioner through live audiovisual communication technology and the disclosure of certain medical information by electronic transmission. I acknowledge that I have been given the opportunity to request an in-person assessment or other available alternative prior to the telemedicine visit and am voluntarily participating in the telemedicine visit.  I understand that I have the right to withhold or withdraw my consent to the use of telemedicine in the course of my care at any time, without affecting my right to future care or treatment,  and that the Practitioner or I may terminate the telemedicine visit at any time. I understand that I have the right to inspect all information obtained and/or recorded in the course of the telemedicine visit and may receive copies of available information for a reasonable fee.  I understand that some of the potential risks of receiving the Services via telemedicine include:   Delay or interruption in medical evaluation due to technological equipment failure or disruption;  Information transmitted may not be sufficient (e.g. poor resolution of images) to allow for appropriate medical decision making by the Practitioner; and/or   In rare instances, security protocols could fail, causing a breach of personal health information.  Furthermore, I acknowledge that it is my responsibility to provide information about my medical history, conditions and care that is complete and accurate to the best of my ability. I acknowledge that Practitioner's advice, recommendations, and/or decision may be based on factors not within their control, such as incomplete or inaccurate data provided by me or distortions of diagnostic images or specimens that may result from electronic transmissions. I  understand that the practice of medicine is not an exact science and that Practitioner makes no warranties or guarantees regarding treatment outcomes. I acknowledge that I will receive a copy of this consent concurrently upon execution via email to the email address I last provided but may also request a printed copy by calling the office of [facility].    I understand that my insurance will be billed for this visit.   I have read or had this consent read to me.  I understand the contents of this consent, which adequately explains the benefits and risks of the Services being provided via telemedicine.   I have been provided ample opportunity to ask questions regarding this consent and the Services and have had my questions  answered to my satisfaction.  I give my informed consent for the services to be provided through the use of telemedicine in my medical care  By participating in this telemedicine visit I agree to the above.

## 2019-04-15 ENCOUNTER — Other Ambulatory Visit: Payer: Self-pay

## 2019-04-16 ENCOUNTER — Ambulatory Visit (INDEPENDENT_AMBULATORY_CARE_PROVIDER_SITE_OTHER): Payer: Medicare Other | Admitting: Family

## 2019-04-16 ENCOUNTER — Encounter: Payer: Self-pay | Admitting: Family

## 2019-04-16 ENCOUNTER — Ambulatory Visit (INDEPENDENT_AMBULATORY_CARE_PROVIDER_SITE_OTHER): Payer: Medicare Other

## 2019-04-16 VITALS — BP 133/71 | HR 73 | Temp 97.6°F | Ht <= 58 in | Wt 207.6 lb

## 2019-04-16 DIAGNOSIS — R0602 Shortness of breath: Secondary | ICD-10-CM | POA: Diagnosis not present

## 2019-04-16 DIAGNOSIS — Z836 Family history of other diseases of the respiratory system: Secondary | ICD-10-CM | POA: Diagnosis not present

## 2019-04-16 NOTE — Progress Notes (Signed)
Subjective:    Patient ID: Robin Lindsey, female    DOB: [birth year], 73 y.o.   MRN: [id number]  Chief Complaint  Patient presents with  . short of breath for a year   Pt presents to the office today for SOB on exertion that has worsen over the last year. She states this has been going on for a year. She is brought in today for a EKG and chest x-ray today. She has a cardiologists appt in 05/05/19. Denies any orthopnea.  She reports her mother has pulmonary fibrosis.   Shortness of Breath  This is a chronic problem. The current episode started more than 1 year ago. The problem occurs intermittently. The problem has been waxing and waning. Pertinent negatives include no chest pain, fever, headaches or orthopnea.      Review of Systems  Constitutional: Negative for fever.  Respiratory: Positive for shortness of breath.   Cardiovascular: Negative for chest pain and orthopnea.  Neurological: Negative for headaches.  All other systems reviewed and are negative.      Objective:   Physical Exam Vitals signs reviewed.  Constitutional:      General: She is not in acute distress.    Appearance: She is well-developed.  HENT:     Head: Normocephalic and atraumatic.     Right Ear: Tympanic membrane normal.     Left Ear: Tympanic membrane normal.  Eyes:     Pupils: Pupils are equal, round, and reactive to light.  Neck:     Musculoskeletal: Normal range of motion and neck supple.     Thyroid: No thyromegaly.  Cardiovascular:     Rate and Rhythm: Normal rate and regular rhythm.     Heart sounds: Normal heart sounds. No murmur.  Pulmonary:     Effort: Pulmonary effort is normal. No respiratory distress.     Breath sounds: Normal breath sounds. No wheezing.  Abdominal:     General: Bowel sounds are normal. There is no distension.     Palpations: Abdomen is soft.     Tenderness: There is no abdominal tenderness.  Musculoskeletal: Normal range of motion.        General: No tenderness.      Right lower leg: No edema.     Left lower leg: No edema.  Skin:    General: Skin is warm and dry.  Neurological:     Mental Status: She is alert and oriented to person, place, and time.     Cranial Nerves: No cranial nerve deficit.     Deep Tendon Reflexes: Reflexes are normal and symmetric.  Psychiatric:        Behavior: Behavior normal.        Thought Content: Thought content normal.        Judgment: Judgment normal.       BP 133/71   Pulse 73   Temp 97.6 F (36.4 C) (Oral)   Ht 4' 10" (1.473 m)   Wt 207 lb 9.6 oz (94.2 kg)   BMI 43.39 kg/m      Assessment & Plan:  Robin Lindsey comes in today with chief complaint of short of breath for a year   Diagnosis and orders addressed:  1. Short of breath on exertion - DG Chest 2 View; Future - EKG 12-Lead - CMP14+EGFR - CBC with Differential/Platelet  2. Family history of pulmonary fibrosis   Labs pending [facility] follow up Discussed SOB could be from deconditioning and the importance of scheduled  exercising.   Evelina Dun, FNP

## 2019-04-16 NOTE — Patient Instructions (Signed)
Shortness of Breath, Adult  Shortness of breath is when a person has trouble breathing enough air or when a person feels like she or he is having trouble breathing in enough air. Shortness of breath could be a sign of a medical problem.  Follow these instructions at home:     Pay attention to any changes in your symptoms.   Do not use any products that contain nicotine or tobacco, such as cigarettes, e-cigarettes, and chewing tobacco.   Do not smoke. Smoking is a common cause of shortness of breath. If you need help quitting, ask your health care provider.   Avoid things that can irritate your airways, such as:  ? Mold.  ? Dust.  ? Air pollution.  ? Chemical fumes.  ? Things that can cause allergy symptoms (allergens), if you have allergies.   Keep your living space clean and free of mold and dust.   Rest as needed. Slowly return to your usual activities.   Take over-the-counter and prescription medicines only as told by your health care provider. This includes oxygen therapy and inhaled medicines.   Keep all follow-up visits as told by your health care provider. This is important.  Contact a health care provider if:   Your condition does not improve as soon as expected.   You have a hard time doing your normal activities, even after you rest.   You have new symptoms.  Get help right away if:   Your shortness of breath gets worse.   You have shortness of breath when you are resting.   You feel light-headed or you faint.   You have a cough that is not controlled with medicines.   You cough up blood.   You have pain with breathing.   You have pain in your chest, arms, shoulders, or abdomen.   You have a fever.   You cannot walk up stairs or exercise the way that you normally do.  These symptoms may represent a serious problem that is an emergency. Do not wait to see if the symptoms will go away. Get medical help right away. Call your local emergency services (911 in the [place] Do not drive yourself  to the hospital.  Summary   Shortness of breath is when a person has trouble breathing enough air. It can be a sign of a medical problem.   Avoid things that irritate your lungs, such as smoking, pollution, mold, and dust.   Pay attention to changes in your symptoms and contact your health care provider if you have a hard time completing daily activities because of shortness of breath.  This information is not intended to replace advice given to you by your health care provider. Make sure you discuss any questions you have with your health care provider.  Document Released: 08/21/2001 Document Revised: 04/28/2018 Document Reviewed: 04/28/2018  Elsevier Interactive Patient Education  2019 Elsevier Inc.

## 2019-04-17 ENCOUNTER — Ambulatory Visit: Payer: Medicare Other | Admitting: Family

## 2019-04-17 LAB — CBC WITH DIFFERENTIAL/PLATELET
Basophils Absolute: 0 10*3/uL (ref 0.0–0.2)
Basos: 1 %
EOS (ABSOLUTE): 0.1 10*3/uL (ref 0.0–0.4)
Eos: 2 %
Hematocrit: 39 % (ref 34.0–46.6)
Hemoglobin: 13 g/dL (ref 11.1–15.9)
Immature Grans (Abs): 0 10*3/uL (ref 0.0–0.1)
Immature Granulocytes: 0 %
Lymphocytes Absolute: 2.4 10*3/uL (ref 0.7–3.1)
Lymphs: 34 %
MCH: 31.1 pg (ref 26.6–33.0)
MCHC: 33.3 g/dL (ref 31.5–35.7)
MCV: 93 fL (ref 79–97)
Monocytes Absolute: 0.5 10*3/uL (ref 0.1–0.9)
Monocytes: 7 %
Neutrophils Absolute: 4.1 10*3/uL (ref 1.4–7.0)
Neutrophils: 56 %
Platelets: 256 10*3/uL (ref 150–450)
RBC: 4.18 x10E6/uL (ref 3.77–5.28)
RDW: 12.9 % (ref 11.7–15.4)
WBC: 7.2 10*3/uL (ref 3.4–10.8)

## 2019-04-17 LAB — CMP14+EGFR
ALT: 14 IU/L (ref 0–32)
AST: 14 IU/L (ref 0–40)
Albumin/Globulin Ratio: 2 (ref 1.2–2.2)
Albumin: 4.2 g/dL (ref 3.7–4.7)
Alkaline Phosphatase: 81 IU/L (ref 39–117)
BUN/Creatinine Ratio: 15 (ref 12–28)
BUN: 15 mg/dL (ref 8–27)
Bilirubin Total: 0.3 mg/dL (ref 0.0–1.2)
CO2: 24 mmol/L (ref 20–29)
Calcium: 9.2 mg/dL (ref 8.7–10.3)
Chloride: 103 mmol/L (ref 96–106)
Creatinine, Ser: 0.98 mg/dL (ref 0.57–1.00)
GFR calc Af Amer: 65 mL/min/{1.73_m2} (ref >59)
GFR calc non Af Amer: 57 mL/min/{1.73_m2} — ABNORMAL LOW (ref >59)
Globulin, Total: 2.1 g/dL (ref 1.5–4.5)
Glucose: 100 mg/dL — ABNORMAL HIGH (ref 65–99)
Potassium: 4.6 mmol/L (ref 3.5–5.2)
Sodium: 143 mmol/L (ref 134–144)
Total Protein: 6.3 g/dL (ref 6.0–8.5)

## 2019-05-05 ENCOUNTER — Encounter: Payer: Self-pay | Admitting: Cardiovascular Disease

## 2019-05-05 ENCOUNTER — Telehealth (INDEPENDENT_AMBULATORY_CARE_PROVIDER_SITE_OTHER): Payer: Medicare Other | Admitting: Cardiovascular Disease

## 2019-05-05 ENCOUNTER — Other Ambulatory Visit: Payer: Self-pay

## 2019-05-05 VITALS — Ht 60.0 in | Wt 204.0 lb

## 2019-05-05 DIAGNOSIS — I209 Angina pectoris, unspecified: Secondary | ICD-10-CM

## 2019-05-05 DIAGNOSIS — R0789 Other chest pain: Secondary | ICD-10-CM

## 2019-05-05 DIAGNOSIS — I10 Essential (primary) hypertension: Secondary | ICD-10-CM

## 2019-05-05 DIAGNOSIS — Z01812 Encounter for preprocedural laboratory examination: Secondary | ICD-10-CM

## 2019-05-05 DIAGNOSIS — R0609 Other forms of dyspnea: Secondary | ICD-10-CM

## 2019-05-05 DIAGNOSIS — R0602 Shortness of breath: Secondary | ICD-10-CM

## 2019-05-05 MED ORDER — METOPROLOL TARTRATE 50 MG PO TABS: 50.0000 mg | ORAL_TABLET | Freq: Once | ORAL | 0 refills | Status: DC

## 2019-05-05 NOTE — Addendum Note (Signed)
Addended by: Laurine Blazer on: 05/05/2019 09:45 AM   Modules accepted: Orders

## 2019-05-05 NOTE — Progress Notes (Signed)
Virtual Visit via Telephone Note   This visit type was conducted due to national recommendations for restrictions regarding the COVID-19 Pandemic (e.g. social distancing) in an effort to limit this patient's exposure and mitigate transmission in our community.  Due to her co-morbid illnesses, this patient is at least at moderate risk for complications without adequate follow up.  This format is felt to be most appropriate for this patient at this time.  The patient did not have access to video technology/had technical difficulties with video requiring transitioning to audio format only (telephone).  All issues noted in this document were discussed and addressed.  No physical exam could be performed with this format.  Please refer to the patient's chart for her  consent to telehealth for [facility].   Date:  05/05/2019   ID:  Robin Lindsey, DOB September 03, 1946, MRN [id number]  Patient Location: Home Provider Location: Office  PCP:  Sharion Balloon, FNP  Cardiologist:  No primary care provider on file.  Electrophysiologist:  None   Evaluation Performed:  New Patient Evaluation  Chief Complaint: Dyspnea on exertion  History of Present Illness:    Robin Lindsey is a 73 y.o. female with hypertension and GERD.  I last saw her in June 2015.  She underwent a normal nuclear stress test on 05/12/2014.  At that time it was felt symptoms of exertional dyspnea could be related to deconditioning.  She saw her PCP on 04/16/2019.  Several studies were ordered.  Chest x-ray on 04/16/2019 showed no active cardiopulmonary disease.  CBC was normal.  Comprehensive metabolic panel was unremarkable.  For the past year she has noticed progressive exertional dyspnea.  Last year when she was at the beach she had to stop frequently due to shortness of breath.  She experiences this when going up and down stairs or when walking her dog in her yard for a short while.  She is also developed a nonproductive cough and  wonders if it is due to seasonal allergies.  She has mild left ankle swelling which is chronic.  She describes arthritis in her hands and feet.  She seldom has chest tightness but thinks it might be due to gas as it improves with belching.  She seldom has palpitations.  She denies lightheadedness and dizziness.  The patient does not have symptoms concerning for COVID-19 infection (fever, chills, cough, or new shortness of breath).   Family history: Father had hypertension and coronary artery disease and had stents.  He had valve replacement surgery and may have had abdominal aortic aneurysm surgery.   Past Medical History:  Diagnosis Date  . Arthritis   . Diabetes mellitus without complication ([place])   . GERD (gastroesophageal reflux disease)   . Glaucoma    worse in left eye than right eye  . Heel pain    right heel pain  . Hypertension   . Personal history of colonic polyps 12/09/2012  . Plantar fasciitis    right foot   Past Surgical History:  Procedure Laterality Date  . BREAST LUMPECTOMY  10 + yrs.   left breast/benign  . CATARACT EXTRACTION W/PHACO  02/14/2012   Procedure: CATARACT EXTRACTION PHACO AND INTRAOCULAR LENS PLACEMENT (IOC);  Surgeon: Tonny Branch, MD;  Location: [facility];  Service: Ophthalmology;  Laterality: Left;  CDE 13.73  . CATARACT EXTRACTION W/PHACO  03/03/2012   Procedure: CATARACT EXTRACTION PHACO AND INTRAOCULAR LENS PLACEMENT (IOC);  Surgeon: Tonny Branch, MD;  Location: [facility];  Service: Ophthalmology;  Laterality: Right;  CDE: 16.49  . COLONOSCOPY    . percutaneous micrfasciotomy with topaz coblation wand  12/2015     Current Meds  Medication Sig  . BAYER MICROLET LANCETS lancets Use to check BG daily  . betaxolol (BETOPTIC-S) 0.5 % ophthalmic suspension Place into both eyes 2 (two) times daily.   Marland Kitchen BLACK ELDERBERRY PO Take by mouth daily.   Marland Kitchen glucose blood (BAYER CONTOUR NEXT TEST) test strip Use to check BG daily  . LUMIGAN 0.01 % SOLN Place into  both eyes daily.   . meloxicam (MOBIC) 7.5 MG tablet TAKE ONE (1) TABLET EACH DAY (Patient taking differently: As needed)  . omeprazole (PRILOSEC) 20 MG capsule Take 1 capsule (20 mg total) by mouth 2 (two) times daily before a meal.  . OVER THE COUNTER MEDICATION Patient takes 1/4 teaspoon of hemp oil by mouth daily  . Probiotic Product (PROBIOTIC DAILY PO) Take 1 capsule by mouth daily.  . valsartan-hydrochlorothiazide (DIOVAN-HCT) 160-12.5 MG tablet Take 1 tablet by mouth daily.     Allergies:   Metformin and related; Aspirin; and Nsaids   Social History   Tobacco Use  . Smoking status: Former Smoker    Packs/day: 1.00    Years: 5.00    Pack years: 5.00    Types: Cigarettes    Start date: 03/10/1962    Last attempt to quit: 03/11/1967    Years since quitting: 52.1  . Smokeless tobacco: Never Used  . Tobacco comment: smoked for 4-5 years from age 24- 71 years old  Substance Use Topics  . Alcohol use: No    Alcohol/week: 0.0 standard drinks  . Drug use: No     Family Hx: The patient's family history includes Colon cancer in her maternal aunt; Colon polyps in her mother; Dementia in her father; Diabetes in her brother and mother; Heart disease in her father; Hypertension in her brother; Lung disease in her father; Meniere's disease in her brother; Pulmonary fibrosis in her maternal aunt, maternal aunt, and mother; Stroke in her father; Thyroid disease in her brother. There is no history of Anesthesia problems, Hypotension, Malignant hyperthermia, or Pseudochol deficiency.  ROS:   Please see the history of present illness.     All other systems reviewed and are negative.   Prior CV studies:   The following studies were reviewed today:  Nuclear stress test 05/12/2014:  IMPRESSION: 1.  Normal exercise Cardiolite stress test.  2.  No evidence of myocardial ischemia or scar.  3. Normal left ventricular systolic function and regional wall motion, calculated LVEF 71%.   Labs/Other Tests and Data Reviewed:    EKG:  An ECG dated 04/16/2019 was personally reviewed today and demonstrated:  Sinus rhythm with left anterior fascicular block  Recent Labs: 04/16/2019: ALT 14; BUN 15; Creatinine, Ser 0.98; Hemoglobin 13.0; Platelets 256; Potassium 4.6; Sodium 143   Recent Lipid Panel Lab Results  Component Value Date/Time   CHOL 160 01/01/2019 11:59 AM   TRIG 147 01/01/2019 11:59 AM   HDL 66 01/01/2019 11:59 AM   CHOLHDL 2.4 01/01/2019 11:59 AM   CHOLHDL 3.2 04/23/2013 10:52 AM   LDLCALC 65 01/01/2019 11:59 AM    Wt Readings from Last 3 Encounters:  05/05/19 204 lb (92.5 kg)  04/16/19 207 lb 9.6 oz (94.2 kg)  01/01/19 200 lb 12.8 oz (91.1 kg)     Objective:    Vital Signs:  Ht 5' (1.524 m)   Wt 204 lb (92.5 kg)  BMI 39.84 kg/m    VITAL SIGNS:  reviewed  ASSESSMENT & PLAN:    1.  Dyspnea on exertion: Unclear etiology at this time.  She underwent a normal nuclear stress test in June 2015.  I will arrange for coronary CT angiography to assess for obstructive coronary artery disease. I will order a 2-D echocardiogram with Doppler to evaluate cardiac structure, function, and regional wall motion.  2.  Hypertension: This will need continued monitoring.   COVID-19 Education: The signs and symptoms of COVID-19 were discussed with the patient and how to seek care for testing (follow up with PCP or arrange E-visit).  The importance of social distancing was discussed today.  Time:   Today, I have spent 30 minutes with the patient with telehealth technology discussing the above problems.     Medication Adjustments/Labs and Tests Ordered: Current medicines are reviewed at length with the patient today.  Concerns regarding medicines are outlined above.   Tests Ordered: No orders of the defined types were placed in this encounter.   Medication Changes: No orders of the defined types were placed in this encounter.   Disposition:  Follow up in 2  month(s)  Signed, Kate Sable, MD  05/05/2019 8:30 AM    [facility]

## 2019-05-05 NOTE — Patient Instructions (Addendum)
Medication Instructions:  Continue all current medications.  Labwork:  BMET  Order enclosed.   Please do your lab about a week prior to the CT.  Testing/Procedures:  Your physician has requested that you have an echocardiogram. Echocardiography is a painless test that uses sound waves to create images of your heart. It provides your doctor with information about the size and shape of your heart and how well your heart's chambers and valves are working. This procedure takes approximately one hour. There are no restrictions for this procedure.  Coronary CTA - done at [facility] will contact with results via phone or letter.    Follow-Up: 2 months   Any Other Special Instructions Will Be Listed Below (If Applicable).  If you need a refill on your cardiac medications before your next appointment, please call your pharmacy.  ===========================================================================  Please arrive at the [facility] main entrance of [facility] at xx:xx AM (30-45 minutes prior to test start time)  [facility] [place], [place] [address] [phone]  Proceed to the [facility] Radiology Department (First Floor).  Please follow these instructions carefully (unless otherwise directed):  Hold all erectile dysfunction medications at least 48 hours prior to test.  On the Night Before the Test: . Be sure to Drink plenty of water. . Do not consume any caffeinated/decaffeinated beverages or chocolate 12 hours prior to your test. . Do not take any antihistamines 12 hours prior to your test. . If you take Metformin do not take 24 hours prior to test. . If the patient has contrast allergy: ? Patient will need a prescription for Prednisone and very clear instructions (as follows): 1. Prednisone 50 mg - take 13 hours prior to test 2. Take another Prednisone 50 mg 7 hours prior to test 3. Take another Prednisone 50 mg 1 hour  prior to test 4. Take Benadryl 50 mg 1 hour prior to test . Patient must complete all four doses of above prophylactic medications. . Patient will need a ride after test due to Benadryl.  On the Day of the Test: . Drink plenty of water. Do not drink any water within one hour of the test. . Do not eat any food 4 hours prior to the test. . You may take your regular medications prior to the test.   . Take metoprolol (Lopressor) two hours prior to test.  (one 50mg  tablet sent to pharmacy today) . HOLD Valsartan/Hydrochlorothiazide morning of the test.    After the Test: . Drink plenty of water. . After receiving IV contrast, you may experience a mild flushed feeling. This is normal. . On occasion, you may experience a mild rash up to 24 hours after the test. This is not dangerous. If this occurs, you can take Benadryl 25 mg and increase your fluid intake. . If you experience trouble breathing, this can be serious. If it is severe call 911 IMMEDIATELY. If it is mild, please call our office. . If you take any of these medications: Glipizide/Metformin, Avandament, Glucavance, please do not take 48 hours after completing test.

## 2019-05-14 ENCOUNTER — Other Ambulatory Visit: Payer: Self-pay | Admitting: Family

## 2019-05-14 DIAGNOSIS — M159 Polyosteoarthritis, unspecified: Secondary | ICD-10-CM

## 2019-05-14 DIAGNOSIS — K219 Gastro-esophageal reflux disease without esophagitis: Secondary | ICD-10-CM

## 2019-05-19 ENCOUNTER — Telehealth: Payer: Self-pay | Admitting: Cardiovascular Disease

## 2019-05-19 NOTE — Telephone Encounter (Signed)
Patient advised that order faxed to [facility] and she did not have to fast.

## 2019-05-19 NOTE — Telephone Encounter (Signed)
Patient is scheduled for CT Coronary 05/21/2019 -she has questions about having her labs before the procedure.  [phone].

## 2019-05-20 ENCOUNTER — Other Ambulatory Visit (HOSPITAL_COMMUNITY)
Admission: RE | Admit: 2019-05-20 | Discharge: 2019-05-20 | Disposition: A | Discharge status: Home / Self Care | Payer: Medicare Other | Source: Ambulatory Visit | Attending: Cardiovascular Disease | Admitting: Cardiovascular Disease

## 2019-05-20 ENCOUNTER — Telehealth (HOSPITAL_COMMUNITY): Payer: Self-pay | Admitting: Emergency Medicine

## 2019-05-20 DIAGNOSIS — Z01812 Encounter for preprocedural laboratory examination: Secondary | ICD-10-CM | POA: Diagnosis not present

## 2019-05-20 DIAGNOSIS — R0609 Other forms of dyspnea: Secondary | ICD-10-CM | POA: Diagnosis not present

## 2019-05-20 LAB — BASIC METABOLIC PANEL
Anion gap: 12 (ref 5–15)
BUN: 16 mg/dL (ref 8–23)
CO2: 25 mmol/L (ref 22–32)
Calcium: 8.9 mg/dL (ref 8.9–10.3)
Chloride: 101 mmol/L (ref 98–111)
Creatinine, Ser: 0.94 mg/dL (ref 0.44–1.00)
GFR calc Af Amer: >60 mL/min (ref >60)
GFR calc non Af Amer: 59 mL/min — ABNORMAL LOW (ref >60)
Glucose, Bld: 104 mg/dL — ABNORMAL HIGH (ref 70–99)
Potassium: 4.1 mmol/L (ref 3.5–5.1)
Sodium: 138 mmol/L (ref 135–145)

## 2019-05-20 NOTE — Telephone Encounter (Signed)
Left message on voicemail with name and callback number Jessamine Barcia RN Navigator Cardiac Imaging  Heart and Vascular Services [phone] Office [phone] Cell  

## 2019-05-21 ENCOUNTER — Ambulatory Visit (HOSPITAL_COMMUNITY)
Admission: RE | Admit: 2019-05-21 | Discharge: 2019-05-21 | Disposition: A | Discharge status: Home / Self Care | Payer: Medicare Other | Source: Ambulatory Visit | Attending: Cardiovascular Disease | Admitting: Cardiovascular Disease

## 2019-05-21 ENCOUNTER — Other Ambulatory Visit: Payer: Self-pay

## 2019-05-21 ENCOUNTER — Ambulatory Visit (HOSPITAL_COMMUNITY): Payer: Medicare Other

## 2019-05-21 DIAGNOSIS — I209 Angina pectoris, unspecified: Secondary | ICD-10-CM

## 2019-05-21 DIAGNOSIS — Z006 Encounter for examination for normal comparison and control in clinical research program: Secondary | ICD-10-CM

## 2019-05-21 MED ORDER — NITROGLYCERIN 0.4 MG SL SUBL
0.8000 mg | SUBLINGUAL_TABLET | Freq: Once | SUBLINGUAL | Status: AC
  Administered 2019-05-21: 0.8 mg via SUBLINGUAL
  Filled 2019-05-21: qty 25

## 2019-05-21 MED ORDER — NITROGLYCERIN 0.4 MG SL SUBL
SUBLINGUAL_TABLET | SUBLINGUAL | Status: AC
  Filled 2019-05-21: qty 2

## 2019-05-21 MED ORDER — IOHEXOL 350 MG/ML SOLN
100.0000 mL | Freq: Once | INTRAVENOUS | Status: AC | PRN
  Administered 2019-05-21: 100 mL via INTRAVENOUS

## 2019-05-21 NOTE — Research (Signed)
Cadfem Informed Consent    Patient Name: Robin Lindsey    Subject met inclusion and exclusion criteria.  The informed consent form, study requirements and expectations were reviewed with the subject and questions and concerns were addressed prior to the signing of the consent form.  The subject verbalized understanding of the trail requirements.  The subject agreed to participate in the CADFEM trial and signed the informed consent.  The informed consent was obtained prior to performance of any protocol-specific procedures for the subject.  A copy of the signed informed consent was given to the subject and a copy was placed in the subject's medical record.   Neva Seat

## 2019-05-25 ENCOUNTER — Telehealth: Payer: Self-pay | Admitting: *Deleted

## 2019-05-25 NOTE — Telephone Encounter (Signed)
Notes recorded by Laurine Blazer, LPN on 0/35/2481 at 85:90 AM EDT  Patient notified. Copy to pmd.  ------   Notes recorded by Herminio Commons, MD on 05/21/2019 at 1:46 PM EDT  Minimal CAD. No significant blockages to explain symptoms.

## 2019-06-03 ENCOUNTER — Ambulatory Visit (INDEPENDENT_AMBULATORY_CARE_PROVIDER_SITE_OTHER): Payer: Medicare Other

## 2019-06-03 DIAGNOSIS — R0609 Other forms of dyspnea: Secondary | ICD-10-CM

## 2019-06-09 ENCOUNTER — Telehealth: Payer: Self-pay | Admitting: *Deleted

## 2019-06-09 NOTE — Telephone Encounter (Signed)
-----   Message from Herminio Commons, MD sent at 06/05/2019  2:08 PM EDT ----- Normal pumping function.

## 2019-06-09 NOTE — Telephone Encounter (Signed)
Patient informed. Copy sent to PCP °

## 2019-06-16 DIAGNOSIS — E119 Type 2 diabetes mellitus without complications: Secondary | ICD-10-CM | POA: Diagnosis not present

## 2019-06-16 DIAGNOSIS — H401111 Primary open-angle glaucoma, right eye, mild stage: Secondary | ICD-10-CM | POA: Diagnosis not present

## 2019-06-16 DIAGNOSIS — Z961 Presence of intraocular lens: Secondary | ICD-10-CM | POA: Diagnosis not present

## 2019-06-16 DIAGNOSIS — H401123 Primary open-angle glaucoma, left eye, severe stage: Secondary | ICD-10-CM | POA: Diagnosis not present

## 2019-07-01 ENCOUNTER — Other Ambulatory Visit: Payer: Self-pay

## 2019-07-02 ENCOUNTER — Ambulatory Visit (INDEPENDENT_AMBULATORY_CARE_PROVIDER_SITE_OTHER): Payer: Medicare Other | Admitting: Family

## 2019-07-02 ENCOUNTER — Encounter: Payer: Self-pay | Admitting: Family

## 2019-07-02 ENCOUNTER — Encounter: Payer: Self-pay | Admitting: *Deleted

## 2019-07-02 VITALS — BP 139/89 | HR 76 | Temp 97.8°F | Ht 60.0 in | Wt 204.4 lb

## 2019-07-02 DIAGNOSIS — F411 Generalized anxiety disorder: Secondary | ICD-10-CM | POA: Diagnosis not present

## 2019-07-02 DIAGNOSIS — E1159 Type 2 diabetes mellitus with other circulatory complications: Secondary | ICD-10-CM

## 2019-07-02 DIAGNOSIS — R0602 Shortness of breath: Secondary | ICD-10-CM | POA: Diagnosis not present

## 2019-07-02 DIAGNOSIS — E559 Vitamin D deficiency, unspecified: Secondary | ICD-10-CM | POA: Diagnosis not present

## 2019-07-02 DIAGNOSIS — R6889 Other general symptoms and signs: Secondary | ICD-10-CM | POA: Diagnosis not present

## 2019-07-02 DIAGNOSIS — K219 Gastro-esophageal reflux disease without esophagitis: Secondary | ICD-10-CM

## 2019-07-02 DIAGNOSIS — I209 Angina pectoris, unspecified: Secondary | ICD-10-CM | POA: Diagnosis not present

## 2019-07-02 DIAGNOSIS — E1169 Type 2 diabetes mellitus with other specified complication: Secondary | ICD-10-CM

## 2019-07-02 DIAGNOSIS — I10 Essential (primary) hypertension: Secondary | ICD-10-CM | POA: Diagnosis not present

## 2019-07-02 LAB — BAYER DCA HB A1C WAIVED: HB A1C (BAYER DCA - WAIVED): 6.1 % (ref <7.0)

## 2019-07-02 MED ORDER — LISINOPRIL 40 MG PO TABS: 40.0000 mg | ORAL_TABLET | Freq: Every day | ORAL | 3 refills | Status: DC

## 2019-07-02 NOTE — Patient Instructions (Signed)
Shortness of Breath, Adult °Shortness of breath is when a person has trouble breathing enough air or when a person feels like she or he is having trouble breathing in enough air. Shortness of breath could be a sign of a medical problem. °Follow these instructions at home: ° °· Pay attention to any changes in your symptoms. °· Do not use any products that contain nicotine or tobacco, such as cigarettes, e-cigarettes, and chewing tobacco. °· Do not smoke. Smoking is a common cause of shortness of breath. If you need help quitting, ask your health care provider. °· Avoid things that can irritate your airways, such as: °? Mold. °? Dust. °? Air pollution. °? Chemical fumes. °? Things that can cause allergy symptoms (allergens), if you have allergies. °· Keep your living space clean and free of mold and dust. °· Rest as needed. Slowly return to your usual activities. °· Take over-the-counter and prescription medicines only as told by your health care provider. This includes oxygen therapy and inhaled medicines. °· Keep all follow-up visits as told by your health care provider. This is important. °Contact a health care provider if: °· Your condition does not improve as soon as expected. °· You have a hard time doing your normal activities, even after you rest. °· You have new symptoms. °Get help right away if: °· Your shortness of breath gets worse. °· You have shortness of breath when you are resting. °· You feel light-headed or you faint. °· You have a cough that is not controlled with medicines. °· You cough up blood. °· You have pain with breathing. °· You have pain in your chest, arms, shoulders, or abdomen. °· You have a fever. °· You cannot walk up stairs or exercise the way that you normally do. °These symptoms may represent a serious problem that is an emergency. Do not wait to see if the symptoms will go away. Get medical help right away. Call your local emergency services (911 in the [place] Do not drive yourself  to the hospital. °Summary °· Shortness of breath is when a person has trouble breathing enough air. It can be a sign of a medical problem. °· Avoid things that irritate your lungs, such as smoking, pollution, mold, and dust. °· Pay attention to changes in your symptoms and contact your health care provider if you have a hard time completing daily activities because of shortness of breath. °This information is not intended to replace advice given to you by your health care provider. Make sure you discuss any questions you have with your health care provider. °Document Released: 08/21/2001 Document Revised: 04/28/2018 Document Reviewed: 04/28/2018 °Elsevier Patient Education © 2020 [employer] Inc[employer] ° °

## 2019-07-02 NOTE — Progress Notes (Signed)
Subjective:    Patient ID: Robin Lindsey, female    DOB: Jun 20, 1946, 73 y.o.   MRN: [id number]  Chief Complaint  Patient presents with  . Medical Management of Chronic Issues    discuss changing blood pressure medication   Pt presents to the office today for chronic follow up. She is followed by Cardiologists for SOB with exertion and had CT coronary on 05/21/19 that was normal and Echo on 06/03/19 what was >65 %. She states she continues to have SOB with any walking and would like to try to change her BP medication.  Diabetes She presents for her follow-up diabetic visit. She has type 2 diabetes mellitus. Her disease course has been stable. Hypoglycemia symptoms include nervousness/anxiousness. Pertinent negatives for diabetes include no blurred vision, no foot paresthesias and no visual change. Symptoms are stable. Pertinent negatives for diabetic complications include no CVA or heart disease. Risk factors for coronary artery disease include diabetes mellitus, dyslipidemia, hypertension, sedentary lifestyle and post-menopausal. She is following a generally unhealthy diet. Her overall blood glucose range is 90-110 mg/dl. Eye exam is current.  Hypertension This is a chronic problem. The current episode started more than 1 year ago. The problem has been resolved since onset. The problem is controlled. Associated symptoms include anxiety, malaise/fatigue, peripheral edema ("at times if I stand too much") and shortness of breath. Pertinent negatives include no blurred vision. Risk factors for coronary artery disease include dyslipidemia, diabetes mellitus and obesity. The current treatment provides moderate improvement. There is no history of CVA.  Gastroesophageal Reflux She complains of belching and heartburn. This is a chronic problem. The current episode started more than 1 year ago. The problem occurs frequently. The problem has been waxing and waning. The symptoms are aggravated by certain foods.  Risk factors include obesity. She has tried a PPI for the symptoms. The treatment provided moderate relief.  Anxiety Presents for follow-up visit. Symptoms include decreased concentration, excessive worry, insomnia, irritability, nervous/anxious behavior, restlessness and shortness of breath. Symptoms occur most days. The severity of symptoms is moderate.        Review of Systems  Constitutional: Positive for irritability and malaise/fatigue.  Eyes: Negative for blurred vision.  Respiratory: Positive for shortness of breath.   Gastrointestinal: Positive for heartburn.  Psychiatric/Behavioral: Positive for decreased concentration. The patient is nervous/anxious and has insomnia.   All other systems reviewed and are negative.      Objective:   Physical Exam Vitals signs reviewed.  Constitutional:      General: She is not in acute distress.    Appearance: She is well-developed. She is obese.  HENT:     Head: Normocephalic and atraumatic.     Right Ear: Tympanic membrane normal.     Left Ear: Tympanic membrane normal.  Eyes:     Pupils: Pupils are equal, round, and reactive to light.  Neck:     Musculoskeletal: Normal range of motion and neck supple.     Thyroid: No thyromegaly.  Cardiovascular:     Rate and Rhythm: Normal rate and regular rhythm.     Heart sounds: Normal heart sounds. No murmur.  Pulmonary:     Effort: Pulmonary effort is normal. No respiratory distress.     Breath sounds: Normal breath sounds. No wheezing.  Abdominal:     General: Bowel sounds are normal. There is no distension.     Palpations: Abdomen is soft.     Tenderness: There is no abdominal tenderness.  Musculoskeletal: Normal  range of motion.        General: No tenderness.  Skin:    General: Skin is warm and dry.  Neurological:     Mental Status: She is alert and oriented to person, place, and time.     Cranial Nerves: No cranial nerve deficit.     Deep Tendon Reflexes: Reflexes are normal  and symmetric.  Psychiatric:        Behavior: Behavior normal.        Thought Content: Thought content normal.        Judgment: Judgment normal.    Diabetic Foot Exam - Simple   Simple Foot Form Diabetic Foot exam was performed with the following findings: Yes 07/02/2019 12:21 PM  Visual Inspection No deformities, no ulcerations, no other skin breakdown bilaterally: Yes Sensation Testing Intact to touch and monofilament testing bilaterally: Yes Pulse Check Posterior Tibialis and Dorsalis pulse intact bilaterally: Yes Comments       BP 139/89   Pulse 76   Temp 97.8 F (36.6 C) (Oral)   Ht 5' (1.524 m)   Wt 204 lb 6.4 oz (92.7 kg)   BMI 39.92 kg/m      Assessment & Plan:  Robin Lindsey comes in today with chief complaint of Medical Management of Chronic Issues (discuss changing blood pressure medication)   Diagnosis and orders addressed:  1. Hypertension associated with diabetes ([place]) Will change valsartan to Lisinopril. Do not take together!!! -Dash diet information given -Exercise encouraged - Stress Management  -Continue current meds -RTO in 2 weeks to recheck  - lisinopril (ZESTRIL) 40 MG tablet; Take 1 tablet (40 mg total) by mouth daily.  Dispense: 90 tablet; Refill: 3 - CMP14+EGFR - CBC with Differential/Platelet  2. Gastroesophageal reflux disease, esophagitis presence not specified  - CMP14+EGFR - CBC with Differential/Platelet  3. Type 2 diabetes mellitus with other specified complication, without long-term current use of insulin (HCC) - CMP14+EGFR - CBC with Differential/Platelet - Bayer DCA Hb A1c Waived  4. Vitamin D deficiency - CMP14+EGFR - CBC with Differential/Platelet  5. GAD (generalized anxiety disorder) - CMP14+EGFR - CBC with Differential/Platelet  6. Morbid obesity ([place]) - CMP14+EGFR - CBC with Differential/Platelet  7. SOB (shortness of breath) on exertion - CMP14+EGFR - CBC with Differential/Platelet - Thyroid Panel With  TSH - Anemia Profile B   Labs pending Health Maintenance reviewed- Will get Diabetic eye scanned into chart Diet and exercise encouraged  Follow up plan: 2 weeks    Evelina Dun, FNP

## 2019-07-03 LAB — CMP14+EGFR
ALT: 14 IU/L (ref 0–32)
AST: 16 IU/L (ref 0–40)
Albumin/Globulin Ratio: 1.7 (ref 1.2–2.2)
Albumin: 4 g/dL (ref 3.7–4.7)
Alkaline Phosphatase: 82 IU/L (ref 39–117)
BUN/Creatinine Ratio: 14 (ref 12–28)
BUN: 13 mg/dL (ref 8–27)
Bilirubin Total: 0.3 mg/dL (ref 0.0–1.2)
CO2: 25 mmol/L (ref 20–29)
Calcium: 8.9 mg/dL (ref 8.7–10.3)
Chloride: 100 mmol/L (ref 96–106)
Creatinine, Ser: 0.96 mg/dL (ref 0.57–1.00)
GFR calc Af Amer: 67 mL/min/{1.73_m2} (ref >59)
GFR calc non Af Amer: 58 mL/min/{1.73_m2} — ABNORMAL LOW (ref >59)
Globulin, Total: 2.3 g/dL (ref 1.5–4.5)
Glucose: 99 mg/dL (ref 65–99)
Potassium: 4.4 mmol/L (ref 3.5–5.2)
Sodium: 138 mmol/L (ref 134–144)
Total Protein: 6.3 g/dL (ref 6.0–8.5)

## 2019-07-03 LAB — ANEMIA PROFILE B
Basophils Absolute: 0 10*3/uL (ref 0.0–0.2)
Basos: 1 %
EOS (ABSOLUTE): 0.1 10*3/uL (ref 0.0–0.4)
Eos: 2 %
Ferritin: 361 ng/mL — ABNORMAL HIGH (ref 15–150)
Folate: >20 ng/mL (ref >3.0)
Hematocrit: 39.5 % (ref 34.0–46.6)
Hemoglobin: 13.1 g/dL (ref 11.1–15.9)
Immature Grans (Abs): 0 10*3/uL (ref 0.0–0.1)
Immature Granulocytes: 0 %
Iron Saturation: 23 % (ref 15–55)
Iron: 54 ug/dL (ref 27–139)
Lymphocytes Absolute: 2.3 10*3/uL (ref 0.7–3.1)
Lymphs: 32 %
MCH: 31.1 pg (ref 26.6–33.0)
MCHC: 33.2 g/dL (ref 31.5–35.7)
MCV: 94 fL (ref 79–97)
Monocytes Absolute: 0.7 10*3/uL (ref 0.1–0.9)
Monocytes: 10 %
Neutrophils Absolute: 4.1 10*3/uL (ref 1.4–7.0)
Neutrophils: 55 %
Platelets: 228 10*3/uL (ref 150–450)
RBC: 4.21 x10E6/uL (ref 3.77–5.28)
RDW: 12.3 % (ref 11.7–15.4)
Retic Ct Pct: 1.1 % (ref 0.6–2.6)
Total Iron Binding Capacity: 235 ug/dL — ABNORMAL LOW (ref 250–450)
UIBC: 181 ug/dL (ref 118–369)
Vitamin B-12: 284 pg/mL (ref 232–1245)
WBC: 7.3 10*3/uL (ref 3.4–10.8)

## 2019-07-03 LAB — THYROID PANEL WITH TSH
Free Thyroxine Index: 1.8 (ref 1.2–4.9)
T3 Uptake Ratio: 23 % — ABNORMAL LOW (ref 24–39)
T4, Total: 7.7 ug/dL (ref 4.5–12.0)
TSH: 1.91 u[IU]/mL (ref 0.450–4.500)

## 2019-07-17 ENCOUNTER — Other Ambulatory Visit: Payer: Self-pay

## 2019-07-20 ENCOUNTER — Other Ambulatory Visit: Payer: Self-pay

## 2019-07-20 ENCOUNTER — Encounter: Payer: Self-pay | Admitting: Family

## 2019-07-20 ENCOUNTER — Ambulatory Visit (INDEPENDENT_AMBULATORY_CARE_PROVIDER_SITE_OTHER): Payer: Medicare Other | Admitting: Family

## 2019-07-20 VITALS — BP 137/79 | HR 75 | Temp 98.0°F | Ht 60.0 in | Wt 203.4 lb

## 2019-07-20 DIAGNOSIS — E1159 Type 2 diabetes mellitus with other circulatory complications: Secondary | ICD-10-CM

## 2019-07-20 DIAGNOSIS — I10 Essential (primary) hypertension: Secondary | ICD-10-CM

## 2019-07-20 DIAGNOSIS — I209 Angina pectoris, unspecified: Secondary | ICD-10-CM | POA: Diagnosis not present

## 2019-07-20 DIAGNOSIS — Z6841 Body Mass Index (BMI) 40.0 and over, adult: Secondary | ICD-10-CM

## 2019-07-20 LAB — BMP8+EGFR
BUN/Creatinine Ratio: 15 (ref 12–28)
BUN: 17 mg/dL (ref 8–27)
CO2: 24 mmol/L (ref 20–29)
Calcium: 9.2 mg/dL (ref 8.7–10.3)
Chloride: 102 mmol/L (ref 96–106)
Creatinine, Ser: 1.12 mg/dL — ABNORMAL HIGH (ref 0.57–1.00)
GFR calc Af Amer: 56 mL/min/{1.73_m2} — ABNORMAL LOW (ref >59)
GFR calc non Af Amer: 48 mL/min/{1.73_m2} — ABNORMAL LOW (ref >59)
Glucose: 117 mg/dL — ABNORMAL HIGH (ref 65–99)
Potassium: 4.9 mmol/L (ref 3.5–5.2)
Sodium: 137 mmol/L (ref 134–144)

## 2019-07-20 MED ORDER — LISINOPRIL 20 MG PO TABS: 20.0000 mg | ORAL_TABLET | Freq: Every day | ORAL | 3 refills | Status: DC

## 2019-07-20 NOTE — Patient Instructions (Signed)

## 2019-07-20 NOTE — Progress Notes (Signed)
   Subjective:    Patient ID: Robin Lindsey, female    DOB: April 07, 1946, 73 y.o.   MRN: [id number]  Chief Complaint  Patient presents with  . Hypertension   PT presents to the office today to recheck HTN. She was seen on 07/02/19 and wanted to change her Valsartan to Lisinopril. She states she can not tell a difference her in SOB. She does states her abdominal discomfort and loose stools have improved since switching.   She states she has noticed a dry cough intermittently.  Hypertension This is a chronic problem. The current episode started more than 1 year ago. The problem has been resolved since onset. The problem is controlled. Associated symptoms include malaise/fatigue and shortness of breath (with exertion ). Pertinent negatives include no peripheral edema. The current treatment provides moderate improvement. There is no history of CAD/MI, CVA or heart failure.      Review of Systems  Constitutional: Positive for malaise/fatigue.  Respiratory: Positive for shortness of breath (with exertion ).   All other systems reviewed and are negative.      Objective:   Physical Exam Vitals signs reviewed.  Constitutional:      General: She is not in acute distress.    Appearance: She is well-developed. She is obese.  HENT:     Head: Normocephalic and atraumatic.  Eyes:     Pupils: Pupils are equal, round, and reactive to light.  Neck:     Musculoskeletal: Normal range of motion and neck supple.     Thyroid: No thyromegaly.  Cardiovascular:     Rate and Rhythm: Normal rate and regular rhythm.     Heart sounds: Normal heart sounds. No murmur.  Pulmonary:     Effort: Pulmonary effort is normal. No respiratory distress.     Breath sounds: Normal breath sounds. No wheezing.  Abdominal:     General: Bowel sounds are normal. There is no distension.     Palpations: Abdomen is soft.     Tenderness: There is no abdominal tenderness.  Musculoskeletal: Normal range of motion.      General: No tenderness.  Skin:    General: Skin is warm and dry.  Neurological:     Mental Status: She is alert and oriented to person, place, and time.     Cranial Nerves: No cranial nerve deficit.     Deep Tendon Reflexes: Reflexes are normal and symmetric.  Psychiatric:        Behavior: Behavior normal.        Thought Content: Thought content normal.        Judgment: Judgment normal.       BP 137/79   Pulse 75   Temp 98 F (36.7 C) (Oral)   Ht 5' (1.524 m)   Wt 203 lb 6.4 oz (92.3 kg)   BMI 39.72 kg/m      Assessment & Plan:  ANGELIKA JERRETT comes in today with chief complaint of Hypertension   Diagnosis and orders addressed:  1. Hypertension associated with diabetes ([place]) Will continue Lisinopril 20 mg -Dash diet information given -Exercise encouraged - Stress Management  -Continue current meds -RTO in 6 months  - lisinopril (ZESTRIL) 20 MG tablet; Take 1 tablet (20 mg total) by mouth daily.  Dispense: 90 tablet; Refill: 3 - BMP8+EGFR  2. BMI 40.0-44.9, adult (HCC) - BMP8+EGFR  3. Morbid obesity ([place]) - BMP8+EGFR   Evelina Dun, FNP

## 2019-07-23 ENCOUNTER — Other Ambulatory Visit: Payer: Self-pay | Admitting: Family

## 2019-07-23 DIAGNOSIS — R7989 Other specified abnormal findings of blood chemistry: Secondary | ICD-10-CM

## 2019-07-27 ENCOUNTER — Other Ambulatory Visit: Payer: Medicare Other

## 2019-07-27 ENCOUNTER — Other Ambulatory Visit: Payer: Self-pay

## 2019-07-27 DIAGNOSIS — R7989 Other specified abnormal findings of blood chemistry: Secondary | ICD-10-CM | POA: Diagnosis not present

## 2019-07-28 LAB — BMP8+EGFR
BUN/Creatinine Ratio: 17 (ref 12–28)
BUN: 17 mg/dL (ref 8–27)
CO2: 26 mmol/L (ref 20–29)
Calcium: 9 mg/dL (ref 8.7–10.3)
Chloride: 103 mmol/L (ref 96–106)
Creatinine, Ser: 1.02 mg/dL — ABNORMAL HIGH (ref 0.57–1.00)
GFR calc Af Amer: 62 mL/min/{1.73_m2} (ref >59)
GFR calc non Af Amer: 54 mL/min/{1.73_m2} — ABNORMAL LOW (ref >59)
Glucose: 185 mg/dL — ABNORMAL HIGH (ref 65–99)
Potassium: 4.4 mmol/L (ref 3.5–5.2)
Sodium: 141 mmol/L (ref 134–144)

## 2019-07-31 ENCOUNTER — Other Ambulatory Visit: Payer: Self-pay

## 2019-07-31 ENCOUNTER — Ambulatory Visit (INDEPENDENT_AMBULATORY_CARE_PROVIDER_SITE_OTHER): Payer: Medicare Other | Admitting: Cardiovascular Disease

## 2019-07-31 ENCOUNTER — Encounter: Payer: Self-pay | Admitting: Cardiovascular Disease

## 2019-07-31 VITALS — BP 122/86 | HR 83 | Ht 60.0 in | Wt 205.0 lb

## 2019-07-31 DIAGNOSIS — I209 Angina pectoris, unspecified: Secondary | ICD-10-CM | POA: Diagnosis not present

## 2019-07-31 DIAGNOSIS — R0609 Other forms of dyspnea: Secondary | ICD-10-CM

## 2019-07-31 DIAGNOSIS — I10 Essential (primary) hypertension: Secondary | ICD-10-CM

## 2019-07-31 NOTE — Patient Instructions (Addendum)
Medication Instructions:   Your physician recommends that you continue on your current medications as directed. Please refer to the Current Medication list given to you today.  Labwork:  NONE  Testing/Procedures:  NONE  Follow-Up:  Your physician recommends that you schedule a follow-up appointment in: as needed.   Any Other Special Instructions Will Be Listed Below (If Applicable).  If you need a refill on your cardiac medications before your next appointment, please call your pharmacy. 

## 2019-07-31 NOTE — Progress Notes (Signed)
SUBJECTIVE: The patient returns for follow-up after undergoing cardiovascular testing performed for the evaluation of dyspnea on exertion.  Coronary CT angiography showed minimal nonobstructive coronary artery disease.  Calcium score was 49 which is in the 50th percentile.  Echocardiogram demonstrated hyperdynamic systolic function with an LVEF greater than 65%.  There was grade 1 diastolic dysfunction.  She continues to experience exertional dyspnea but denies orthopnea and paroxysmal nocturnal dyspnea.  She has chronic left ankle swelling.  She denies exertional chest pain.  She tries to stay active.  She does not intentionally exercise.  Her mother died of pulmonary fibrosis.   Review of Systems: As per "subjective", otherwise negative.  Allergies  Allergen Reactions  . Metformin And Related Nausea And Vomiting  . Aspirin Nausea Only and Other (See Comments)    pain  . Nsaids     Stomach pain    Current Outpatient Medications  Medication Sig Dispense Refill  . BAYER MICROLET LANCETS lancets Use to check BG daily 100 each 3  . BLACK ELDERBERRY PO Take by mouth daily.     Marland Kitchen glucose blood (BAYER CONTOUR NEXT TEST) test strip Use to check BG daily 100 each 3  . LUMIGAN 0.01 % SOLN Place into both eyes daily.     . meloxicam (MOBIC) 7.5 MG tablet TAKE ONE (1) TABLET EACH DAY 30 tablet 1  . omeprazole (PRILOSEC) 20 MG capsule TAKE ONE CAPSULE BY MOUTH TWICE A DAY BEFORE MEALS 180 capsule 1  . OVER THE COUNTER MEDICATION Patient takes 1/4 teaspoon of hemp oil by mouth daily    . valsartan-hydrochlorothiazide (DIOVAN-HCT) 160-12.5 MG tablet Take 1 tablet by mouth daily.     No current facility-administered medications for this visit.     Past Medical History:  Diagnosis Date  . Arthritis   . Diabetes mellitus without complication ([place])   . GERD (gastroesophageal reflux disease)   . Glaucoma    worse in left eye than right eye  . Heel pain    right heel pain  .  Hypertension   . Personal history of colonic polyps 12/09/2012  . Plantar fasciitis    right foot    Past Surgical History:  Procedure Laterality Date  . BREAST LUMPECTOMY  10 + yrs.   left breast/benign  . CATARACT EXTRACTION W/PHACO  02/14/2012   Procedure: CATARACT EXTRACTION PHACO AND INTRAOCULAR LENS PLACEMENT (IOC);  Surgeon: Tonny Branch, MD;  Location: [facility];  Service: Ophthalmology;  Laterality: Left;  CDE 13.73  . CATARACT EXTRACTION W/PHACO  03/03/2012   Procedure: CATARACT EXTRACTION PHACO AND INTRAOCULAR LENS PLACEMENT (I[facility]);  Surgeon: Tonny Branch, MD;  Location: [facility];  Service: Ophthalmology;  Laterality: Right;  CDE: 16.49  . COLONOSCOPY    . percutaneous micrfasciotomy with topaz coblation wand  12/2015    Social History   Socioeconomic History  . Marital status: Married    Spouse name: Not on file  . Number of children: 3  . Years of education: 16  . Highest education level: 12th grade  Occupational History  . Occupation: retired  Scientific laboratory technician  . Financial resource strain: Not hard at all  . Food insecurity    Worry: Never true    Inability: Never true  . Transportation needs    Medical: No    Non-medical: No  Tobacco Use  . Smoking status: Former Smoker    Packs/day: 1.00    Years: 5.00    Pack years: 5.00  Types: Cigarettes    Start date: 03/10/1962    Quit date: 03/11/1967    Years since quitting: 52.4  . Smokeless tobacco: Never Used  . Tobacco comment: smoked for 4-5 years from age 8- 10 years old  Substance and Sexual Activity  . Alcohol use: No    Alcohol/week: 0.0 standard drinks  . Drug use: No  . Sexual activity: Never  Lifestyle  . Physical activity    Days per week: 0 days    Minutes per session: 0 min  . Stress: Not at all  Relationships  . Social connections    Talks on phone: More than three times a week    Gets together: More than three times a week    Attends religious service: More than 4 times per year    Active member of  club or organization: No    Attends meetings of clubs or organizations: Never    Relationship status: Married  . Intimate partner violence    Fear of current or ex partner: No    Emotionally abused: No    Physically abused: No    Forced sexual activity: No  Other Topics Concern  . Not on file  Social History Narrative  . Not on file     Vitals:   07/31/19 1400  BP: 122/86  Pulse: 83  SpO2: 99%  Weight: 205 lb (93 kg)  Height: 5' (1.524 m)    Wt Readings from Last 3 Encounters:  07/31/19 205 lb (93 kg)  07/20/19 203 lb 6.4 oz (92.3 kg)  07/02/19 204 lb 6.4 oz (92.7 kg)     PHYSICAL EXAM General: NAD HEENT: Normal. Neck: No JVD, no thyromegaly. Lungs: Clear to auscultation bilaterally with normal respiratory effort. CV: Regular rate and rhythm, normal S1/S2, no S3/S4, no murmur. No pretibial or periankle edema.   Abdomen: Soft, nontender, no distention.  Neurologic: Alert and oriented.  Psych: Normal affect. Skin: Normal. Musculoskeletal: No gross deformities.    ECG: Reviewed above under Subjective   Labs: Lab Results  Component Value Date/Time   K 4.4 07/27/2019 03:39 PM   BUN 17 07/27/2019 03:39 PM   CREATININE 1.02 (H) 07/27/2019 03:39 PM   CREATININE 0.92 04/23/2013 10:52 AM   ALT 14 07/02/2019 12:04 PM   TSH 1.910 07/02/2019 12:04 PM   HGB 13.1 07/02/2019 12:04 PM     Lipids: Lab Results  Component Value Date/Time   LDLCALC 65 01/01/2019 11:59 AM   CHOL 160 01/01/2019 11:59 AM   TRIG 147 01/01/2019 11:59 AM   HDL 66 01/01/2019 11:59 AM       Coronary CT angiogram 05/21/2019:  IMPRESSION: 1. Calcium score 49 which is 25 th percentile for age and sex  2.  CAD RADS one minimal non obstructive CAD see description above  3.  Normal aortic root 3.0 cm    ASSESSMENT AND PLAN: 1.  Dyspnea on exertion: Coronary CT angiogram reviewed above as well as echocardiogram.  Left ventricular systolic function is normal.  There was minimal  nonobstructive coronary artery disease.  No further cardiac investigations are indicated at this time.  Nothing on physical exam to suggest pulmonary fibrosis although pulmonary function testing could be considered if deemed necessary.  I will defer this to her PCP.  2.  Hypertension: Blood pressure is normal.  No changes to therapy.    Disposition: Follow up with me as needed   Kate Sable, M.D., F.A.C.C.

## 2019-08-19 ENCOUNTER — Ambulatory Visit (INDEPENDENT_AMBULATORY_CARE_PROVIDER_SITE_OTHER): Payer: Medicare Other | Admitting: *Deleted

## 2019-08-19 DIAGNOSIS — Z0000 Encounter for general adult medical examination without abnormal findings: Secondary | ICD-10-CM

## 2019-08-19 NOTE — Progress Notes (Signed)
MEDICARE ANNUAL WELLNESS VISIT  08/19/2019  Telephone Visit Disclaimer This Medicare AWV was conducted by telephone due to national recommendations for restrictions regarding the COVID-19 Pandemic (e.g. social distancing).  I verified, using two identifiers, that I am speaking with Robin Lindsey or their authorized healthcare agent. I discussed the limitations, risks, security, and privacy concerns of performing an evaluation and management service by telephone and the potential availability of an in-person appointment in the future. The patient expressed understanding and agreed to proceed.   Subjective:  Robin Lindsey is a 73 y.o. female patient of Hawks, Theador Hawthorne, FNP who had a Medicare Annual Wellness Visit today via telephone. Robin Lindsey is Retired and lives with their spouse. she has 3 children. she reports that she is socially active and does interact with friends/family regularly. she is minimally physically active and enjoys cleaning her house and scrolling through social media.  Patient Care Team: Sharion Balloon, FNP as PCP - General (Family Medicine) Herminio Commons, MD as PCP - Cardiology (Cardiology) Clent Jacks, MD as Consulting Physician (Ophthalmology) Steffanie Rainwater, DPM as Consulting Physician (Podiatry) Maisie Fus, MD as Consulting Physician (Obstetrics and Gynecology)  Advanced Directives 08/19/2019 08/14/2018 05/21/2016 04/04/2016 02/11/2012  Does Patient Have a Medical Advance Directive? No No No Yes Patient does not have advance directive;Patient would not like information  Type of Advance Directive - - - [place] -  Would patient like information on creating a medical advance directive? No - Patient declined No - Patient declined Yes - Educational materials given [facility] Utilization Over the Past 12 Months: # of hospitalizations or ER visits: 0 # of surgeries: 0  Review of Systems    Patient reports that her overall health is  unchanged compared to last year.  History obtained from chart review  Patient Reported Readings (BP, Pulse, CBG, Weight, etc) none  Pain Assessment       Current Medications & Allergies (verified) Allergies as of 08/19/2019      Reactions   Metformin And Related Nausea And Vomiting   Aspirin Nausea Only, Other (See Comments)   pain   Nsaids    Stomach pain      Medication List       Accurate as of August 19, 2019 10:30 AM. If you have any questions, ask your nurse or doctor.        Bayer Microlet Lancets lancets Use to check BG daily   BLACK ELDERBERRY PO Take by mouth daily.   glucose blood test strip Commonly known as: Visual merchandiser Next Test Use to check BG daily   Lumigan 0.01 % Soln Generic drug: bimatoprost Place into both eyes daily.   meloxicam 7.5 MG tablet Commonly known as: MOBIC TAKE ONE (1) TABLET EACH DAY   omeprazole 20 MG capsule Commonly known as: PRILOSEC TAKE ONE CAPSULE BY MOUTH TWICE A DAY BEFORE MEALS   OVER THE COUNTER MEDICATION Patient takes 1/4 teaspoon of hemp oil by mouth daily   valsartan-hydrochlorothiazide 160-12.5 MG tablet Commonly known as: DIOVAN-HCT Take 1 tablet by mouth daily.       History (reviewed): Past Medical History:  Diagnosis Date  . Arthritis   . Diabetes mellitus without complication ([place])   . GERD (gastroesophageal reflux disease)   . Glaucoma    worse in left eye than right eye  . Heel pain    right heel pain  . Hypertension   . Personal  history of colonic polyps 12/09/2012  . Plantar fasciitis    right foot   Past Surgical History:  Procedure Laterality Date  . BREAST LUMPECTOMY  10 + yrs.   left breast/benign  . CATARACT EXTRACTION W/PHACO  02/14/2012   Procedure: CATARACT EXTRACTION PHACO AND INTRAOCULAR LENS PLACEMENT (IOC);  Surgeon: Tonny Branch, MD;  Location: [facility];  Service: Ophthalmology;  Laterality: Left;  CDE 13.73  . CATARACT EXTRACTION W/PHACO  03/03/2012   Procedure:  CATARACT EXTRACTION PHACO AND INTRAOCULAR LENS PLACEMENT (IOC);  Surgeon: Tonny Branch, MD;  Location: [facility];  Service: Ophthalmology;  Laterality: Right;  CDE: 16.49  . COLONOSCOPY    . percutaneous micrfasciotomy with topaz coblation wand  12/2015   Family History  Problem Relation Age of Onset  . Colon polyps Mother   . Diabetes Mother   . Pulmonary fibrosis Mother   . Colon cancer Maternal Aunt   . Pulmonary fibrosis Maternal Aunt   . Heart disease Father   . Stroke Father   . Dementia Father   . Lung disease Father        realted to asbestos exposure  . Thyroid disease Brother   . Diabetes Brother   . Hypertension Brother   . Meniere's disease Brother   . Pulmonary fibrosis Maternal Aunt   . Anesthesia problems Neg Hx   . Hypotension Neg Hx   . Malignant hyperthermia Neg Hx   . Pseudochol deficiency Neg Hx    Social History   Socioeconomic History  . Marital status: Married    Spouse name: Louis  . Number of children: 3  . Years of education: 66  . Highest education level: 12th grade  Occupational History  . Occupation: retired  Scientific laboratory technician  . Financial resource strain: Not hard at all  . Food insecurity    Worry: Never true    Inability: Never true  . Transportation needs    Medical: No    Non-medical: No  Tobacco Use  . Smoking status: Former Smoker    Packs/day: 1.00    Years: 5.00    Pack years: 5.00    Types: Cigarettes    Start date: 03/10/1962    Quit date: 03/11/1967    Years since quitting: 52.4  . Smokeless tobacco: Never Used  . Tobacco comment: smoked for 4-5 years from age 60- 28 years old  Substance and Sexual Activity  . Alcohol use: No    Alcohol/week: 0.0 standard drinks  . Drug use: No  . Sexual activity: Not Currently  Lifestyle  . Physical activity    Days per week: 5 days    Minutes per session: 30 min  . Stress: Not at all  Relationships  . Social connections    Talks on phone: More than three times a week    Gets together:  More than three times a week    Attends religious service: More than 4 times per year    Active member of club or organization: No    Attends meetings of clubs or organizations: Never    Relationship status: Married  Other Topics Concern  . Not on file  Social History Narrative  . Not on file    Activities of Daily Living In your present state of health, do you have any difficulty performing the following activities: 08/19/2019  Hearing? N  Vision? Y  Comment has glaucoma, wears glasses for reading or when looking at the computer  Difficulty concentrating or making decisions? N  Walking or climbing stairs? N  Dressing or bathing? N  Doing errands, shopping? N  Preparing Food and eating ? N  Using the Toilet? N  In the past six months, have you accidently leaked urine? N  Do you have problems with loss of bowel control? N  Managing your Medications? N  Managing your Finances? N  Housekeeping or managing your Housekeeping? N  Some recent data might be hidden    Patient Education/ Literacy How often do you need to have someone help you when you read instructions, pamphlets, or other written materials from your doctor or pharmacy?: 1 - Never What is the last grade level you completed in school?: 12th grade  Exercise Current Exercise Habits: Home exercise routine, Type of exercise: walking, Time (Minutes): 30, Frequency (Times/Week): 5, Weekly Exercise (Minutes/Week): 150, Intensity: Mild, Exercise limited by: orthopedic condition(s)  Diet Patient reports consuming 3 meals a day and 0 snack(s) a day Patient reports that her primary diet is: Regular Patient reports that she does have regular access to food.   Depression Screen PHQ 2/9 Scores 08/19/2019 07/20/2019 07/02/2019 04/16/2019 01/01/2019 08/14/2018 06/30/2018  PHQ - 2 Score 0 0 0 0 0 0 0     Fall Risk Fall Risk  08/19/2019 07/20/2019 07/02/2019 04/16/2019 01/01/2019  Falls in the past year? 0 0 0 0 0  Number falls in past yr: 0 - - -  -  Injury with Fall? 0 - - - -  Risk for fall due to : - - - - -  Follow up Falls prevention discussed - - - -  Comment Get rid of all throw rugs in the house, adequate lighting in the walkways and grab bars in the bathroom - - - -     Objective:  Robin Lindsey seemed alert and oriented and she participated appropriately during our telephone visit.  Blood Pressure Weight BMI  BP Readings from Last 3 Encounters:  07/31/19 122/86  07/20/19 137/79  07/02/19 139/89   Wt Readings from Last 3 Encounters:  07/31/19 205 lb (93 kg)  07/20/19 203 lb 6.4 oz (92.3 kg)  07/02/19 204 lb 6.4 oz (92.7 kg)   BMI Readings from Last 1 Encounters:  07/31/19 40.04 kg/m    *Unable to obtain current vital signs, weight, and BMI due to telephone visit type  Hearing/Vision  . Azaela did not seem to have difficulty with hearing/understanding during the telephone conversation . Reports that she has had a formal eye exam by an eye care professional within the past year . Reports that she has not had a formal hearing evaluation within the past year *Unable to fully assess hearing and vision during telephone visit type  Cognitive Function: 6CIT Screen 08/19/2019  What Year? 0 points  What month? 0 points  What time? 0 points  Count back from 20 0 points  Months in reverse 0 points  Repeat phrase 0 points  Total Score 0   (Normal:0-7, Significant for Dysfunction: >8)  Normal Cognitive Function Screening: Yes   Immunization & Health Maintenance Record Immunization History  Administered Date(s) Administered  . Influenza, High Dose Seasonal PF 10/01/2016, 09/04/2017, 10/23/2018  . Influenza,inj,Quad PF,6+ Mos 10/06/2013, 09/22/2014  . Influenza-Unspecified 10/10/2015  . Pneumococcal Conjugate-13 10/06/2013  . Pneumococcal Polysaccharide-23 02/16/2016  . Zoster 04/21/2014    Health Maintenance  Topic Date Due  . Samul Dada  03/11/1963  . DEXA SCAN  08/22/2018  . OPHTHALMOLOGY EXAM   04/02/2019  . INFLUENZA VACCINE  04/29/2020 (  Originally 07/11/2019)  . HEMOGLOBIN A1C  01/02/2020  . FOOT EXAM  07/01/2020  . COLONOSCOPY  05/02/2021  . Hepatitis C Screening  Completed  . PNA vac Low Risk Adult  Completed       Assessment  This is a routine wellness examination for Robin Lindsey.  Health Maintenance: Due or Overdue Health Maintenance Due  Topic Date Due  . TETANUS/TDAP  03/11/1963  . DEXA SCAN  08/22/2018  . OPHTHALMOLOGY EXAM  04/02/2019    Robin Lindsey does not need a referral for Community Assistance: Care Management:   no Social Work:    no Prescription Assistance:  no Nutrition/Diabetes Education:  no   Plan:  Personalized Goals Goals Addressed            This Visit's Progress   . DIET - INCREASE WATER INTAKE       Try to drink 6-8 glasses of water daily.      Personalized Health Maintenance & Screening Recommendations  Influenza vaccine Td vaccine Bone densitometry screening  Lung Cancer Screening Recommended: no (Low Dose CT Chest recommended if Age 58-80 years, 30 pack-year currently smoking OR have quit w/in past 15 years) Hepatitis C Screening recommended: no HIV Screening recommended: no  Advanced Directives: Written information was not prepared per patient's request.  Referrals & Orders No orders of the defined types were placed in this encounter.   Follow-up Plan . Follow-up with Sharion Balloon, FNP as planned . Schedule your DEXA as discussed . Consider Flu and TDAP vaccines at your next visit with your PCP   I have personally reviewed and noted the following in the patient's chart:   . Medical and social history . Use of alcohol, tobacco or illicit drugs  . Current medications and supplements . Functional ability and status . Nutritional status . Physical activity . Advanced directives . List of other physicians . Hospitalizations, surgeries, and ER visits in previous 12 months . Vitals . Screenings to  include cognitive, depression, and falls . Referrals and appointments  In addition, I have reviewed and discussed with Robin Lindsey certain preventive protocols, quality metrics, and best practice recommendations. A written personalized care plan for preventive services as well as general preventive health recommendations is available and can be mailed to the patient at her request.      Marylin Crosby, LPN  [phone]

## 2019-08-19 NOTE — Patient Instructions (Signed)
Preventive Care 38 Years and Older, Female Preventive care refers to lifestyle choices and visits with your health care provider that can promote health and wellness. This includes:  A yearly physical exam. This is also called an annual well check.  Regular dental and eye exams.  Immunizations.  Screening for certain conditions.  Healthy lifestyle choices, such as diet and exercise. What can I expect for my preventive care visit? Physical exam Your health care provider will check:  Height and weight. These may be used to calculate body mass index (BMI), which is a measurement that tells if you are at a healthy weight.  Heart rate and blood pressure.  Your skin for abnormal spots. Counseling Your health care provider may ask you questions about:  Alcohol, tobacco, and drug use.  Emotional well-being.  Home and relationship well-being.  Sexual activity.  Eating habits.  History of falls.  Memory and ability to understand (cognition).  Work and work Statistician.  Pregnancy and menstrual history. What immunizations do I need?  Influenza (flu) vaccine  This is recommended every year. Tetanus, diphtheria, and pertussis (Tdap) vaccine  You may need a Td booster every 10 years. Varicella (chickenpox) vaccine  You may need this vaccine if you have not already been vaccinated. Zoster (shingles) vaccine  You may need this after age 75. Pneumococcal conjugate (PCV13) vaccine  One dose is recommended after age 75. Pneumococcal polysaccharide (PPSV23) vaccine  One dose is recommended after age 75. Measles, mumps, and rubella (MMR) vaccine  You may need at least one dose of MMR if you were born in 1957 or later. You may also need a second dose. Meningococcal conjugate (MenACWY) vaccine  You may need this if you have certain conditions. Hepatitis A vaccine  You may need this if you have certain conditions or if you travel or work in places where you may be exposed  to hepatitis A. Hepatitis B vaccine  You may need this if you have certain conditions or if you travel or work in places where you may be exposed to hepatitis B. Haemophilus influenzae type b (Hib) vaccine  You may need this if you have certain conditions. You may receive vaccines as individual doses or as more than one vaccine together in one shot (combination vaccines). Talk with your health care provider about the risks and benefits of combination vaccines. What tests do I need? Blood tests  Lipid and cholesterol levels. These may be checked every 5 years, or more frequently depending on your overall health.  Hepatitis C test.  Hepatitis B test. Screening  Lung cancer screening. You may have this screening every year starting at age 75 if you have a 30-pack-year history of smoking and currently smoke or have quit within the past 15 years.  Colorectal cancer screening. All adults should have this screening starting at age 75 and continuing until age 15. Your health care provider may recommend screening at age 75 if you are at increased risk. You will have tests every 1-10 years, depending on your results and the type of screening test.  Diabetes screening. This is done by checking your blood sugar (glucose) after you have not eaten for a while (fasting). You may have this done every 1-3 years.  Mammogram. This may be done every 1-2 years. Talk with your health care provider about how often you should have regular mammograms.  BRCA-related cancer screening. This may be done if you have a family history of breast, ovarian, tubal, or peritoneal cancers.  Other tests  Sexually transmitted disease (STD) testing.  Bone density scan. This is done to screen for osteoporosis. You may have this done starting at age 75. Follow these instructions at home: Eating and drinking  Eat a diet that includes fresh fruits and vegetables, whole grains, lean protein, and low-fat dairy products. Limit  your intake of foods with high amounts of sugar, saturated fats, and salt.  Take vitamin and mineral supplements as recommended by your health care provider.  Do not drink alcohol if your health care provider tells you not to drink.  If you drink alcohol: ? Limit how much you have to 0-1 drink a day. ? Be aware of how much alcohol is in your drink. In the [place] one drink equals one 12 oz bottle of beer (355 mL), one 5 oz glass of wine (148 mL), or one 1 oz glass of hard liquor (44 mL). Lifestyle  Take daily care of your teeth and gums.  Stay active. Exercise for at least 30 minutes on 5 or more days each week.  Do not use any products that contain nicotine or tobacco, such as cigarettes, e-cigarettes, and chewing tobacco. If you need help quitting, ask your health care provider.  If you are sexually active, practice safe sex. Use a condom or other form of protection in order to prevent STIs (sexually transmitted infections).  Talk with your health care provider about taking a low-dose aspirin or statin. What's next?  Go to your health care provider once a year for a well check visit.  Ask your health care provider how often you should have your eyes and teeth checked.  Stay up to date on all vaccines. This information is not intended to replace advice given to you by your health care provider. Make sure you discuss any questions you have with your health care provider. Document Released: 12/23/2015 Document Revised: 11/20/2018 Document Reviewed: 11/20/2018 Elsevier Patient Education  2020 [employer].

## 2019-09-18 ENCOUNTER — Encounter: Payer: Self-pay | Admitting: Family Medicine

## 2019-09-18 ENCOUNTER — Ambulatory Visit (INDEPENDENT_AMBULATORY_CARE_PROVIDER_SITE_OTHER): Payer: Medicare Other | Admitting: Family Medicine

## 2019-09-18 ENCOUNTER — Other Ambulatory Visit: Payer: Self-pay

## 2019-09-18 VITALS — BP 137/70 | HR 80 | Temp 96.6°F | Ht 61.0 in | Wt 203.2 lb

## 2019-09-18 DIAGNOSIS — R42 Dizziness and giddiness: Secondary | ICD-10-CM | POA: Diagnosis not present

## 2019-09-18 DIAGNOSIS — I209 Angina pectoris, unspecified: Secondary | ICD-10-CM

## 2019-09-18 MED ORDER — MECLIZINE HCL 25 MG PO TABS: 25.0000 mg | ORAL_TABLET | Freq: Three times a day (TID) | ORAL | 1 refills | Status: DC | PRN

## 2019-09-18 NOTE — Progress Notes (Signed)
BP 137/70   Pulse 80   Temp (!) 96.6 F (35.9 C) (Temporal)   Ht 5\' 1"  (1.549 m)   Wt 203 lb 3.2 oz (92.2 kg)   SpO2 97%   BMI 38.39 kg/m    Subjective:   Patient ID: Robin Lindsey, female    DOB: 1946/03/28, 73 y.o.   MRN: [id number]  HPI: Robin Lindsey is a 73 y.o. female presenting on 09/18/2019 for Dizziness (Patient states that she is dizzy at night while sleeping and it wakes her up. Patient states it happened two nights in a row.  She is fine during the day.)   HPI Dizziness and spinning sensation that patient has been fighting over the past 2 nights.  She says she woke up to use the restroom on both occasions and felt like the room was spinning and she cannot get up and the first time she had a subtle down for a minute and then it passed in the second time somebody had to come help her to the restroom and then eventually it did pass.  She has not had it during the daytime in the last 2 days and she has taking Claritin yesterday and does not seem to have any issues.  She denies any headache or blurred vision or congestion or ear pain.  She denies any fevers or chills.  Relevant past medical, surgical, family and social history reviewed and updated as indicated. Interim medical history since our last visit reviewed. Allergies and medications reviewed and updated.  Review of Systems  Constitutional: Negative for chills and fever.  Eyes: Negative for visual disturbance.  Respiratory: Negative for chest tightness and shortness of breath.   Cardiovascular: Negative for chest pain and leg swelling.  Musculoskeletal: Negative for back pain and gait problem.  Skin: Negative for rash.  Neurological: Positive for dizziness. Negative for speech difficulty, weakness, light-headedness, numbness and headaches.  Psychiatric/Behavioral: Negative for agitation and behavioral problems.  All other systems reviewed and are negative.   Per HPI unless specifically indicated above    Allergies as of 09/18/2019      Reactions   Metformin And Related Nausea And Vomiting   Aspirin Nausea Only, Other (See Comments)   pain   Nsaids    Stomach pain      Medication List       Accurate as of September 18, 2019 10:51 AM. If you have any questions, ask your nurse or doctor.        Bayer Microlet Lancets lancets Use to check BG daily   BLACK ELDERBERRY PO Take by mouth daily.   glucose blood test strip Commonly known as: Visual merchandiser Next Test Use to check BG daily   Lumigan 0.01 % Soln Generic drug: bimatoprost Place into both eyes daily.   meclizine 25 MG tablet Commonly known as: ANTIVERT Take 1 tablet (25 mg total) by mouth 3 (three) times daily as needed for dizziness. Started by: Fransisca Kaufmann Dettinger, MD   meloxicam 7.5 MG tablet Commonly known as: MOBIC TAKE ONE (1) TABLET EACH DAY   omeprazole 20 MG capsule Commonly known as: PRILOSEC TAKE ONE CAPSULE BY MOUTH TWICE A DAY BEFORE MEALS   OVER THE COUNTER MEDICATION Patient takes 1/4 teaspoon of hemp oil by mouth daily   valsartan-hydrochlorothiazide 160-12.5 MG tablet Commonly known as: DIOVAN-HCT Take 1 tablet by mouth daily.        Objective:   BP 137/70   Pulse 80   Temp Marland Kitchen)  96.6 F (35.9 C) (Temporal)   Ht 5\' 1"  (1.549 m)   Wt 203 lb 3.2 oz (92.2 kg)   SpO2 97%   BMI 38.39 kg/m   Wt Readings from Last 3 Encounters:  09/18/19 203 lb 3.2 oz (92.2 kg)  07/31/19 205 lb (93 kg)  07/20/19 203 lb 6.4 oz (92.3 kg)    Physical Exam Vitals signs and nursing note reviewed.  Constitutional:      General: She is not in acute distress.    Appearance: She is well-developed. She is not diaphoretic.  Eyes:     General: No scleral icterus.       Right eye: No discharge.        Left eye: No discharge.     Extraocular Movements: Extraocular movements intact.     Conjunctiva/sclera: Conjunctivae normal.     Pupils: Pupils are equal, round, and reactive to light.  Cardiovascular:     Rate  and Rhythm: Normal rate and regular rhythm.     Heart sounds: Normal heart sounds. No murmur.  Pulmonary:     Effort: Pulmonary effort is normal. No respiratory distress.     Breath sounds: Normal breath sounds. No wheezing.  Musculoskeletal: Normal range of motion.        General: No tenderness.  Skin:    General: Skin is warm and dry.     Findings: No rash.  Neurological:     Mental Status: She is alert and oriented to person, place, and time.     Cranial Nerves: No cranial nerve deficit.     Sensory: No sensory deficit.     Coordination: Coordination normal.     Gait: Gait normal.  Psychiatric:        Behavior: Behavior normal.       Assessment & Plan:   Problem List Items Addressed This Visit    None    Visit Diagnoses    Vertigo    -  Primary   Relevant Medications   meclizine (ANTIVERT) 25 MG tablet      Only been going on for 2 nights, likely vertigo even despite family history of Mnire's, will treat as such and gave handout for procedures she can do to help alleviate it if it recurs and instructed her that if it persists more than a week then we should go see either neurologist or an ENT. Follow up plan: Return if symptoms worsen or fail to improve.  Counseling provided for all of the vaccine components No orders of the defined types were placed in this encounter.   Caryl Pina, MD [facility] 09/18/2019, 10:51 AM

## 2019-09-30 ENCOUNTER — Other Ambulatory Visit: Payer: Self-pay

## 2019-10-01 ENCOUNTER — Ambulatory Visit (INDEPENDENT_AMBULATORY_CARE_PROVIDER_SITE_OTHER): Payer: Medicare Other

## 2019-10-01 DIAGNOSIS — Z23 Encounter for immunization: Secondary | ICD-10-CM | POA: Diagnosis not present

## 2019-10-14 DIAGNOSIS — Z961 Presence of intraocular lens: Secondary | ICD-10-CM | POA: Diagnosis not present

## 2019-10-14 DIAGNOSIS — H401123 Primary open-angle glaucoma, left eye, severe stage: Secondary | ICD-10-CM | POA: Diagnosis not present

## 2019-10-14 DIAGNOSIS — H401111 Primary open-angle glaucoma, right eye, mild stage: Secondary | ICD-10-CM | POA: Diagnosis not present

## 2019-10-14 DIAGNOSIS — E119 Type 2 diabetes mellitus without complications: Secondary | ICD-10-CM | POA: Diagnosis not present

## 2019-10-14 DIAGNOSIS — H16223 Keratoconjunctivitis sicca, not specified as Sjogren's, bilateral: Secondary | ICD-10-CM | POA: Diagnosis not present

## 2019-11-03 ENCOUNTER — Telehealth: Payer: Self-pay | Admitting: Family

## 2019-11-03 NOTE — Telephone Encounter (Signed)
Patient aware and verbalizes understanding. 

## 2019-11-03 NOTE — Telephone Encounter (Signed)
Patient aware and verbalizes understanding.  States that she has been taking 1.5 in order for the pain to feel better. Instructions on the bottle say take on daily.  Patient would like to know if its okay and safe for her to be taking more than prescribed? Please advise

## 2019-11-03 NOTE — Telephone Encounter (Signed)
This is fine. She should not take any more than 15 mg a day (2 tablets).

## 2019-11-03 NOTE — Telephone Encounter (Signed)
Ok to take these. Do not mix with any other NSAID"s.

## 2019-11-10 ENCOUNTER — Ambulatory Visit (INDEPENDENT_AMBULATORY_CARE_PROVIDER_SITE_OTHER): Payer: Medicare Other | Admitting: Family Medicine

## 2019-11-10 DIAGNOSIS — H9202 Otalgia, left ear: Secondary | ICD-10-CM | POA: Diagnosis not present

## 2019-11-10 MED ORDER — CIPROFLOXACIN-DEXAMETHASONE 0.3-0.1 % OT SUSP: 4.0000 [drp] | Freq: Two times a day (BID) | OTIC | 0 refills | Status: DC

## 2019-11-10 NOTE — Progress Notes (Signed)
Telephone visit  Subjective: CC: earache PCP: Sharion Balloon, FNP [id number] Robin Lindsey is a 73 y.o. female calls for telephone consult today. Patient provides verbal consent for consult held via phone.  Location of patient: home Location of provider: [facility] Others present for call: none  1. Earache Patient reports >1 week history of left sided earache and kanker sore.  She reports kanker sore is improving.  She has been using sweet oil and hyland's ear drops for the earache which did help some but pain returned.  She reports dizzy spells she was experiencing has resolved.  She reports some low grade fever that resolved.  No drainage, no congestion, no sore throat.  No facial swelling.   ROS: Per HPI  Allergies  Allergen Reactions  . Metformin And Related Nausea And Vomiting  . Aspirin Nausea Only and Other (See Comments)    pain  . Nsaids     Stomach pain   Past Medical History:  Diagnosis Date  . Arthritis   . Diabetes mellitus without complication ([place])   . GERD (gastroesophageal reflux disease)   . Glaucoma    worse in left eye than right eye  . Heel pain    right heel pain  . Hypertension   . Personal history of colonic polyps 12/09/2012  . Plantar fasciitis    right foot    Current Outpatient Medications:  .  BAYER MICROLET LANCETS lancets, Use to check BG daily, Disp: 100 each, Rfl: 3 .  BLACK ELDERBERRY PO, Take by mouth daily. , Disp: , Rfl:  .  glucose blood (BAYER CONTOUR NEXT TEST) test strip, Use to check BG daily, Disp: 100 each, Rfl: 3 .  LUMIGAN 0.01 % SOLN, Place into both eyes daily. , Disp: , Rfl:  .  meclizine (ANTIVERT) 25 MG tablet, Take 1 tablet (25 mg total) by mouth 3 (three) times daily as needed for dizziness., Disp: 30 tablet, Rfl: 1 .  meloxicam (MOBIC) 7.5 MG tablet, TAKE ONE (1) TABLET EACH DAY, Disp: 30 tablet, Rfl: 1 .  omeprazole (PRILOSEC) 20 MG capsule, TAKE ONE CAPSULE BY MOUTH TWICE A DAY BEFORE MEALS, Disp: 180 capsule, Rfl: 1 .   OVER THE COUNTER MEDICATION, Patient takes 1/4 teaspoon of hemp oil by mouth daily, Disp: , Rfl:  .  valsartan-hydrochlorothiazide (DIOVAN-HCT) 160-12.5 MG tablet, Take 1 tablet by mouth daily., Disp: , Rfl:   Assessment/ Plan: 73 y.o. female   1. Otalgia of left ear Uncertain etiology though seems refractory to over-the-counter measures.  Will empirically treat for presumed otitis externa versus media with Ciprodex.  We discussed using heat followed by ice, massage to promote eustachian tube drainage.  Okay to continue oral NSAID if she finds it is helpful.  She has follow-up with PCP Friday.   - ciprofloxacin-dexamethasone (CIPRODEX) OTIC suspension; Place 4 drops into the left ear 2 (two) times daily for 7 days.  Dispense: 7.5 mL; Refill: 0   Start time: 12:55pm End time: 1:01pm  Total time spent on patient care (including telephone call/ virtual visit): 11 minutes  [place], [place] [phone]

## 2019-11-10 NOTE — Patient Instructions (Signed)

## 2019-11-11 ENCOUNTER — Other Ambulatory Visit: Payer: Self-pay

## 2019-11-12 ENCOUNTER — Ambulatory Visit (INDEPENDENT_AMBULATORY_CARE_PROVIDER_SITE_OTHER): Payer: Medicare Other | Admitting: Family

## 2019-11-12 ENCOUNTER — Ambulatory Visit (INDEPENDENT_AMBULATORY_CARE_PROVIDER_SITE_OTHER): Payer: Medicare Other

## 2019-11-12 ENCOUNTER — Encounter: Payer: Self-pay | Admitting: Family

## 2019-11-12 VITALS — BP 157/92 | HR 71 | Temp 96.0°F | Ht 61.0 in | Wt 207.0 lb

## 2019-11-12 DIAGNOSIS — M546 Pain in thoracic spine: Secondary | ICD-10-CM

## 2019-11-12 DIAGNOSIS — M542 Cervicalgia: Secondary | ICD-10-CM

## 2019-11-12 DIAGNOSIS — I209 Angina pectoris, unspecified: Secondary | ICD-10-CM | POA: Diagnosis not present

## 2019-11-12 DIAGNOSIS — G8929 Other chronic pain: Secondary | ICD-10-CM

## 2019-11-12 DIAGNOSIS — K120 Recurrent oral aphthae: Secondary | ICD-10-CM

## 2019-11-12 DIAGNOSIS — H9202 Otalgia, left ear: Secondary | ICD-10-CM | POA: Diagnosis not present

## 2019-11-12 MED ORDER — FLUTICASONE PROPIONATE 50 MCG/ACT NA SUSP: 2.0000 | Freq: Every day | NASAL | 6 refills | Status: DC

## 2019-11-12 MED ORDER — BACLOFEN 10 MG PO TABS: 5.0000 mg | ORAL_TABLET | Freq: Three times a day (TID) | ORAL | 0 refills | Status: DC

## 2019-11-12 MED ORDER — CETIRIZINE HCL 10 MG PO TABS: 10.0000 mg | ORAL_TABLET | Freq: Every day | ORAL | 11 refills | Status: DC

## 2019-11-12 MED ORDER — TRIAMCINOLONE ACETONIDE 0.1 % MT PSTE: 1.0000 "application " | PASTE | Freq: Two times a day (BID) | OROMUCOSAL | 0 refills | Status: DC

## 2019-11-12 NOTE — Patient Instructions (Signed)
Acute Back Pain, Adult Acute back pain is sudden and usually short-lived. It is often caused by an injury to the muscles and tissues in the back. The injury may result from:  A muscle or ligament getting overstretched or torn (strained). Ligaments are tissues that connect bones to each other. Lifting something improperly can cause a back strain.  Wear and tear (degeneration) of the spinal disks. Spinal disks are circular tissue that provides cushioning between the bones of the spine (vertebrae).  Twisting motions, such as while playing sports or doing yard work.  A hit to the back.  Arthritis. You may have a physical exam, lab tests, and imaging tests to find the cause of your pain. Acute back pain usually goes away with rest and home care. Follow these instructions at home: Managing pain, stiffness, and swelling  Take over-the-counter and prescription medicines only as told by your health care provider.  Your health care provider may recommend applying ice during the first 24-48 hours after your pain starts. To do this: ? Put ice in a plastic bag. ? Place a towel between your skin and the bag. ? Leave the ice on for 20 minutes, 2-3 times a day.  If directed, apply heat to the affected area as often as told by your health care provider. Use the heat source that your health care provider recommends, such as a moist heat pack or a heating pad. ? Place a towel between your skin and the heat source. ? Leave the heat on for 20-30 minutes. ? Remove the heat if your skin turns bright red. This is especially important if you are unable to feel pain, heat, or cold. You have a greater risk of getting burned. Activity   Do not stay in bed. Staying in bed for more than 1-2 days can delay your recovery.  Sit up and stand up straight. Avoid leaning forward when you sit, or hunching over when you stand. ? If you work at a desk, sit close to it so you do not need to lean over. Keep your chin tucked  in. Keep your neck drawn back, and keep your elbows bent at a right angle. Your arms should look like the letter "L." ? Sit high and close to the steering wheel when you drive. Add lower back (lumbar) support to your car seat, if needed.  Take short walks on even surfaces as soon as you are able. Try to increase the length of time you walk each day.  Do not sit, drive, or stand in one place for more than 30 minutes at a time. Sitting or standing for long periods of time can put stress on your back.  Do not drive or use heavy machinery while taking prescription pain medicine.  Use proper lifting techniques. When you bend and lift, use positions that put less stress on your back: ? Bend your knees. ? Keep the load close to your body. ? Avoid twisting.  Exercise regularly as told by your health care provider. Exercising helps your back heal faster and helps prevent back injuries by keeping muscles strong and flexible.  Work with a physical therapist to make a safe exercise program, as recommended by your health care provider. Do any exercises as told by your physical therapist. Lifestyle  Maintain a healthy weight. Extra weight puts stress on your back and makes it difficult to have good posture.  Avoid activities or situations that make you feel anxious or stressed. Stress and anxiety increase muscle   tension and can make back pain worse. Learn ways to manage anxiety and stress, such as through exercise. General instructions  Sleep on a firm mattress in a comfortable position. Try lying on your side with your knees slightly bent. If you lie on your back, put a pillow under your knees.  Follow your treatment plan as told by your health care provider. This may include: ? Cognitive or behavioral therapy. ? Acupuncture or massage therapy. ? Meditation or yoga. Contact a health care provider if:  You have pain that is not relieved with rest or medicine.  You have increasing pain going down  into your legs or buttocks.  Your pain does not improve after 2 weeks.  You have pain at night.  You lose weight without trying.  You have a fever or chills. Get help right away if:  You develop new bowel or bladder control problems.  You have unusual weakness or numbness in your arms or legs.  You develop nausea or vomiting.  You develop abdominal pain.  You feel faint. Summary  Acute back pain is sudden and usually short-lived.  Use proper lifting techniques. When you bend and lift, use positions that put less stress on your back.  Take over-the-counter and prescription medicines and apply heat or ice as directed by your health care provider. This information is not intended to replace advice given to you by your health care provider. Make sure you discuss any questions you have with your health care provider. Document Released: 11/26/2005 Document Revised: 03/17/2019 Document Reviewed: 07/10/2017 Elsevier Patient Education  2020 [employer]  Canker sores are small, painful sores that develop inside your mouth. You can get one or more canker sores on the inside of your lips or cheeks, on your tongue, or anywhere inside your mouth. Canker sores cannot be passed from person to person (are not contagious). These sores are different from the sores that you may get on the outside of your lips (cold sores or fever blisters). What are the causes? The cause of this condition is not known. The condition may be passed down from a parent (genetic). What increases the risk? This condition is more likely to develop in:  Women.  People in their teens or 35s.  Women who are having their menstrual period.  People who are under a lot of emotional stress.  People who do not get enough iron or B vitamins.  People who do not take care of their mouth and teeth (have poor oral hygiene).  People who have an injury inside the mouth, such as after having dental work or from  chewing something hard. What are the signs or symptoms? Canker sores usually start as painful red bumps. Then they turn into small white, yellow, or gray sores that have red borders. The sores may be painful, and the pain may get worse when you eat or drink. Along with the canker sore, symptoms may also include:  Fever.  Fatigue.  Swollen lymph nodes in your neck. How is this diagnosed? This condition may be diagnosed based on your symptoms and an exam of the inside of your mouth. If you get canker sores often or if they are very bad, you may have tests, such as:  Blood tests to rule out possible causes.  Swabbing a fluid sample from the sore to be tested for infection.  Removing a small tissue sample from the sore (biopsy) to test it for cancer. How is this treated? Most canker  sores go away without treatment in about 1 week. Home care is usually the only treatment that you will need. Over-the-counter medicines can relieve discomfort. If you have severe canker sores, your health care provider may prescribe:  Numbing ointment to relieve pain. ? Do not use numbing gels or products containing benzocaine in children who are 69 years of age or younger.  Vitamins.  Steroid medicines. These may be given as pills, mouth rinses, or gels.  Antibiotic mouth rinse. Follow these instructions at home:   Apply, take, or use over-the-counter and prescription medicines only as told by your health care provider. These include vitamins and ointments.  If you were prescribed an antibiotic mouth rinse, use it as told by your health care provider. Do not stop using the antibiotic even if your condition improves.  Until the sores are healed: ? Do not drink coffee or citrus juices. ? Do not eat spicy or salty foods.  Use a mild, over-the-counter mouth rinse as recommended by your health care provider.  Practice good oral hygiene by: ? Flossing your teeth every day. ? Brushing your teeth with a  soft toothbrush twice each day. Contact a health care provider if:  Your symptoms do not get better after 2 weeks.  You also have a fever or swollen glands in your neck.  You get canker sores often.  You have a canker sore that is getting larger.  You cannot eat or drink due to your canker sores. Summary  Canker sores are small, painful sores that develop inside your mouth.  Canker sores usually start as painful red bumps that turn into small white, yellow, or gray sores that have red borders.  The sores may be quite painful, and the pain may get worse when you eat or drink.  Most canker sores clear up without treatment in about 1 week. Home care is usually the only treatment that you will need. Over-the-counter medicines can relieve discomfort. This information is not intended to replace advice given to you by your health care provider. Make sure you discuss any questions you have with your health care provider. Document Released: 03/23/2011 Document Revised: 11/08/2017 Document Reviewed: 09/02/2017 Elsevier Patient Education  2020 [employer].

## 2019-11-12 NOTE — Progress Notes (Signed)
Subjective:    Patient ID: Robin Lindsey, female    DOB: January 29, 1946, 73 y.o.   MRN: [id number]  Chief Complaint  Patient presents with  . neck and back pain  . left ear pain    sore under tongue   Pt presents to the office today with several complaints. She reports she has a caker sore on her left tongue that started 2 weeks ago. It has improved as she can eat now, but states when it first appeared she couldn't open her mouth because of the pain.  Neck Pain  This is a chronic problem. The current episode started 1 to 4 weeks ago. The problem occurs intermittently. The problem has been waxing and waning. The pain is associated with nothing. The pain is present in the left side. The quality of the pain is described as aching. The pain is at a severity of 6/10. The pain is moderate. The symptoms are aggravated by bending. Pertinent negatives include no headaches.  Back Pain This is a chronic problem. The current episode started more than 1 year ago. The problem occurs intermittently. The problem has been waxing and waning since onset. The pain is present in the thoracic spine. The quality of the pain is described as aching. The pain is at a severity of 6/10 (0 when sitting, but with lifting and bending 6). The pain is moderate. Pertinent negatives include no headaches.  Otalgia  There is pain in the left ear. This is a new problem. The current episode started 1 to 4 weeks ago. The problem occurs constantly. The problem has been gradually improving. Associated symptoms include neck pain. Pertinent negatives include no coughing, diarrhea, ear discharge, headaches or hearing loss. She has tried ear drops for the symptoms. The treatment provided mild relief.      Review of Systems  HENT: Positive for ear pain. Negative for ear discharge and hearing loss.   Respiratory: Negative for cough.   Gastrointestinal: Negative for diarrhea.  Musculoskeletal: Positive for back pain and neck pain.   Neurological: Negative for headaches.  All other systems reviewed and are negative.      Objective:   Physical Exam Vitals signs reviewed.  Constitutional:      General: She is not in acute distress.    Appearance: She is well-developed. She is obese.  HENT:     Head: Normocephalic and atraumatic.     Right Ear: Tympanic membrane normal.     Left Ear: Tympanic membrane normal.  Eyes:     Pupils: Pupils are equal, round, and reactive to light.  Neck:     Musculoskeletal: Normal range of motion and neck supple.     Thyroid: No thyromegaly.  Cardiovascular:     Rate and Rhythm: Normal rate and regular rhythm.     Heart sounds: Normal heart sounds. No murmur.  Pulmonary:     Effort: Pulmonary effort is normal. No respiratory distress.     Breath sounds: Normal breath sounds. No wheezing.  Abdominal:     General: Bowel sounds are normal. There is no distension.     Palpations: Abdomen is soft.     Tenderness: There is no abdominal tenderness.  Musculoskeletal: Normal range of motion.        General: No tenderness.     Comments: Full ROM of neck and back  Skin:    General: Skin is warm and dry.  Neurological:     Mental Status: She is alert and oriented to  person, place, and time.     Cranial Nerves: No cranial nerve deficit.     Deep Tendon Reflexes: Reflexes are normal and symmetric.  Psychiatric:        Behavior: Behavior normal.        Thought Content: Thought content normal.        Judgment: Judgment normal.       BP (!) 157/92   Pulse 71   Temp (!) 96 F (35.6 C) (Temporal)   Ht 5\' 1"  (1.549 m)   Wt 207 lb (93.9 kg)   SpO2 100%   BMI 39.11 kg/m      Assessment & Plan:  TRANIYAH CHRISTINE comes in today with chief complaint of neck and back pain and left ear pain (sore under tongue)   Diagnosis and orders addressed:  1. Chronic midline thoracic back pain Rest ROM exercises discussed Continue Mobic  Baclofen as needed Sedation precautions discussed -  baclofen (LIORESAL) 10 MG tablet; Take 0.5-1 tablets (5-10 mg total) by mouth 3 (three) times daily.  Dispense: 30 each; Refill: 0 - DG Thoracic Spine 2 View; Future  2. Otalgia of left ear Start daily zyrtec and flonase Stop Ciprodex drops If pain continues over the next 2 weeks will do referral to ENT - cetirizine (ZYRTEC) 10 MG tablet; Take 1 tablet (10 mg total) by mouth daily.  Dispense: 30 tablet; Refill: 11 - fluticasone (FLONASE) 50 MCG/ACT nasal spray; Place 2 sprays into both nostrils daily.  Dispense: 16 g; Refill: 6  3. Neck pain  4. Canker sore - triamcinolone (KENALOG) 0.1 % paste; Use as directed 1 application in the mouth or throat 2 (two) times daily.  Dispense: 5 g; Refill: 0    Evelina Dun, FNP

## 2019-11-23 ENCOUNTER — Other Ambulatory Visit: Payer: Self-pay | Admitting: Family

## 2019-11-23 DIAGNOSIS — M159 Polyosteoarthritis, unspecified: Secondary | ICD-10-CM

## 2019-12-16 ENCOUNTER — Encounter: Payer: Self-pay | Admitting: Family Medicine

## 2019-12-16 ENCOUNTER — Ambulatory Visit (INDEPENDENT_AMBULATORY_CARE_PROVIDER_SITE_OTHER): Payer: Medicare Other | Admitting: Family Medicine

## 2019-12-16 DIAGNOSIS — K219 Gastro-esophageal reflux disease without esophagitis: Secondary | ICD-10-CM | POA: Diagnosis not present

## 2019-12-16 DIAGNOSIS — G542 Cervical root disorders, not elsewhere classified: Secondary | ICD-10-CM

## 2019-12-16 MED ORDER — CYCLOBENZAPRINE HCL 10 MG PO TABS: 10.0000 mg | ORAL_TABLET | Freq: Three times a day (TID) | ORAL | 1 refills | Status: DC | PRN

## 2019-12-16 MED ORDER — OMEPRAZOLE 20 MG PO CPDR: DELAYED_RELEASE_CAPSULE | ORAL | 1 refills | Status: DC

## 2019-12-16 MED ORDER — PREDNISONE 10 MG PO TABS: ORAL_TABLET | ORAL | 0 refills | Status: DC

## 2019-12-16 NOTE — Progress Notes (Signed)
Subjective:    Patient ID: Robin Lindsey, female    DOB: [birth year], 73 y.o.   MRN: [id number]   HPI: Robin Lindsey is a 74 y.o. female presenting for neck and shoulder pain on the left side radiating under the shoulder blade.Onset two months ago.  Already taking meloxicam, 7.5 daily. [name] had her increase to BID. Had tried baclofen and had weird dreams and night terrors. Supplementing with Tylenol. Meds help for a little while, but wear off. 6-7/10 pain. "Just pain." Can't describe it.   Pt. Has hx of diabetes, but off all treatment. Doing well. Aware that predniiisone can raise blood sugar.   Depression screen [employer] 2/9 11/12/2019 09/18/2019 08/19/2019 07/20/2019 07/02/2019  Decreased Interest 0 0 0 0 0  Down, Depressed, Hopeless 0 0 0 0 0  PHQ - 2 Score 0 0 0 0 0     Relevant past medical, surgical, family and social history reviewed and updated as indicated.  Interim medical history since our last visit reviewed. Allergies and medications reviewed and updated.  ROS:  Review of Systems  Constitutional: Negative.   HENT: Negative.   Respiratory: Negative for shortness of breath.   Cardiovascular: Negative for chest pain.  Gastrointestinal: Positive for abdominal pain (pt. has a senssitive stomach. Does well with BID PPI).  Musculoskeletal: Positive for arthralgias.     Social History   Tobacco Use  Smoking Status Former Smoker  . Packs/day: 1.00  . Years: 5.00  . Pack years: 5.00  . Types: Cigarettes  . Start date: 03/10/1962  . Quit date: 03/11/1967  . Years since quitting: 52.8  Smokeless Tobacco Never Used  Tobacco Comment   smoked for 4-5 years from age 60- 80 years old       Objective:     Wt Readings from Last 3 Encounters:  11/12/19 207 lb (93.9 kg)  09/18/19 203 lb 3.2 oz (92.2 kg)  07/31/19 205 lb (93 kg)     Exam deferred. Pt. Harboring due to COVID 19. Phone visit performed.   Assessment & Plan:   1. Cervical nerve root impingement   2.  Gastroesophageal reflux disease     Meds ordered this encounter  Medications  . predniSONE (DELTASONE) 10 MG tablet    Sig: Take 5 daily for 2 days followed by 4,3,2 and 1 for 2 days each.    Dispense:  30 tablet    Refill:  0  . cyclobenzaprine (FLEXERIL) 10 MG tablet    Sig: Take 1 tablet (10 mg total) by mouth 3 (three) times daily as needed for muscle spasms.    Dispense:  90 tablet    Refill:  1  . omeprazole (PRILOSEC) 20 MG capsule    Sig: TAKE ONE CAPSULE BY MOUTH TWICE A DAY BEFORE MEALS    Dispense:  180 capsule    Refill:  1    Reaction to baclofen - avoid future use of the medication,    Diagnoses and all orders for this visit:  Cervical nerve root impingement  Gastroesophageal reflux disease -     omeprazole (PRILOSEC) 20 MG capsule; TAKE ONE CAPSULE BY MOUTH TWICE A DAY BEFORE MEALS  Other orders -     predniSONE (DELTASONE) 10 MG tablet; Take 5 daily for 2 days followed by 4,3,2 and 1 for 2 days each. -     cyclobenzaprine (FLEXERIL) 10 MG tablet; Take 1 tablet (10 mg total) by mouth 3 (three) times daily as needed for  muscle spasms.    Virtual Visit via telephone Note  I discussed the limitations, risks, security and privacy concerns of performing an evaluation and management service by telephone and the availability of in person appointments. The patient was identified with two identifiers. Pt.expressed understanding and agreed to proceed. Pt. Is at home. Dr. Livia Snellen is in his office.  Follow Up Instructions:   I discussed the assessment and treatment plan with the patient. The patient was provided an opportunity to ask questions and all were answered. The patient agreed with the plan and demonstrated an understanding of the instructions.   The patient was advised to call back or seek an in-person evaluation if the symptoms worsen or if the condition fails to improve as anticipated.   Total minutes including chart review and phone contact time: 19    Follow up plan: No follow-ups on file.  Claretta Fraise, MD [place]

## 2020-02-01 ENCOUNTER — Other Ambulatory Visit: Payer: Self-pay | Admitting: Family

## 2020-02-01 DIAGNOSIS — M159 Polyosteoarthritis, unspecified: Secondary | ICD-10-CM

## 2020-02-08 DIAGNOSIS — M542 Cervicalgia: Secondary | ICD-10-CM | POA: Diagnosis not present

## 2020-02-10 DIAGNOSIS — M542 Cervicalgia: Secondary | ICD-10-CM | POA: Diagnosis not present

## 2020-02-12 DIAGNOSIS — H16223 Keratoconjunctivitis sicca, not specified as Sjogren's, bilateral: Secondary | ICD-10-CM | POA: Diagnosis not present

## 2020-02-12 DIAGNOSIS — E119 Type 2 diabetes mellitus without complications: Secondary | ICD-10-CM | POA: Diagnosis not present

## 2020-02-12 DIAGNOSIS — H401123 Primary open-angle glaucoma, left eye, severe stage: Secondary | ICD-10-CM | POA: Diagnosis not present

## 2020-02-12 DIAGNOSIS — Z961 Presence of intraocular lens: Secondary | ICD-10-CM | POA: Diagnosis not present

## 2020-02-12 DIAGNOSIS — H401111 Primary open-angle glaucoma, right eye, mild stage: Secondary | ICD-10-CM | POA: Diagnosis not present

## 2020-02-22 DIAGNOSIS — M542 Cervicalgia: Secondary | ICD-10-CM | POA: Diagnosis not present

## 2020-02-23 ENCOUNTER — Telehealth: Payer: Self-pay | Admitting: Family

## 2020-02-23 NOTE — Chronic Care Management (AMB) (Signed)
  Chronic Care Management   Note  02/23/2020 Name: Robin Lindsey MRN: [id number] DOB: 1945/12/19  Robin Lindsey is a 74 y.o. year old female who is a primary care patient of Sharion Balloon, FNP. I reached out to Robin Lindsey by phone today in response to a referral sent by Ms. Wallene Huh Kerwin's health plan.     [name] was given information about Chronic Care Management services today including:  1. CCM service includes personalized support from designated clinical staff supervised by her physician, including individualized plan of care and coordination with other care providers 2. 24/7 contact phone numbers for assistance for urgent and routine care needs. 3. Service will only be billed when office clinical staff spend 20 minutes or more in a month to coordinate care. 4. Only one practitioner may furnish and bill the service in a calendar month. 5. The patient may stop CCM services at any time (effective at the end of the month) by phone call to the office staff. 6. The patient will be responsible for cost sharing (co-pay) of up to 20% of the service fee (after annual deductible is met).  Patient agreed to services and verbal consent obtained.   Follow up plan: Telephone appointment with care management team member scheduled for:06/22/2020  Noreene Larsson, [place], [place], [place] [address] Direct Dial: [phone] [email or web address] Website: [email or web address]

## 2020-02-25 DIAGNOSIS — Z1231 Encounter for screening mammogram for malignant neoplasm of breast: Secondary | ICD-10-CM | POA: Diagnosis not present

## 2020-02-26 DIAGNOSIS — M542 Cervicalgia: Secondary | ICD-10-CM | POA: Diagnosis not present

## 2020-02-29 DIAGNOSIS — M542 Cervicalgia: Secondary | ICD-10-CM | POA: Diagnosis not present

## 2020-03-07 DIAGNOSIS — M542 Cervicalgia: Secondary | ICD-10-CM | POA: Diagnosis not present

## 2020-03-08 ENCOUNTER — Other Ambulatory Visit: Payer: Self-pay | Admitting: Family

## 2020-03-10 DIAGNOSIS — M542 Cervicalgia: Secondary | ICD-10-CM | POA: Diagnosis not present

## 2020-03-15 DIAGNOSIS — M542 Cervicalgia: Secondary | ICD-10-CM | POA: Diagnosis not present

## 2020-03-21 DIAGNOSIS — Z961 Presence of intraocular lens: Secondary | ICD-10-CM | POA: Diagnosis not present

## 2020-03-21 DIAGNOSIS — H401123 Primary open-angle glaucoma, left eye, severe stage: Secondary | ICD-10-CM | POA: Diagnosis not present

## 2020-03-21 DIAGNOSIS — E119 Type 2 diabetes mellitus without complications: Secondary | ICD-10-CM | POA: Diagnosis not present

## 2020-03-21 DIAGNOSIS — H16223 Keratoconjunctivitis sicca, not specified as Sjogren's, bilateral: Secondary | ICD-10-CM | POA: Diagnosis not present

## 2020-03-21 DIAGNOSIS — H401111 Primary open-angle glaucoma, right eye, mild stage: Secondary | ICD-10-CM | POA: Diagnosis not present

## 2020-03-23 DIAGNOSIS — M542 Cervicalgia: Secondary | ICD-10-CM | POA: Diagnosis not present

## 2020-03-25 IMAGING — DX DG THORACIC SPINE 2V
3 series · 3 of 3 positions shown · non-contrast
Comparison: None.

CLINICAL DATA: Chronic mid back pain

EXAM:
THORACIC SPINE 2 VIEWS

[t-spine ap]
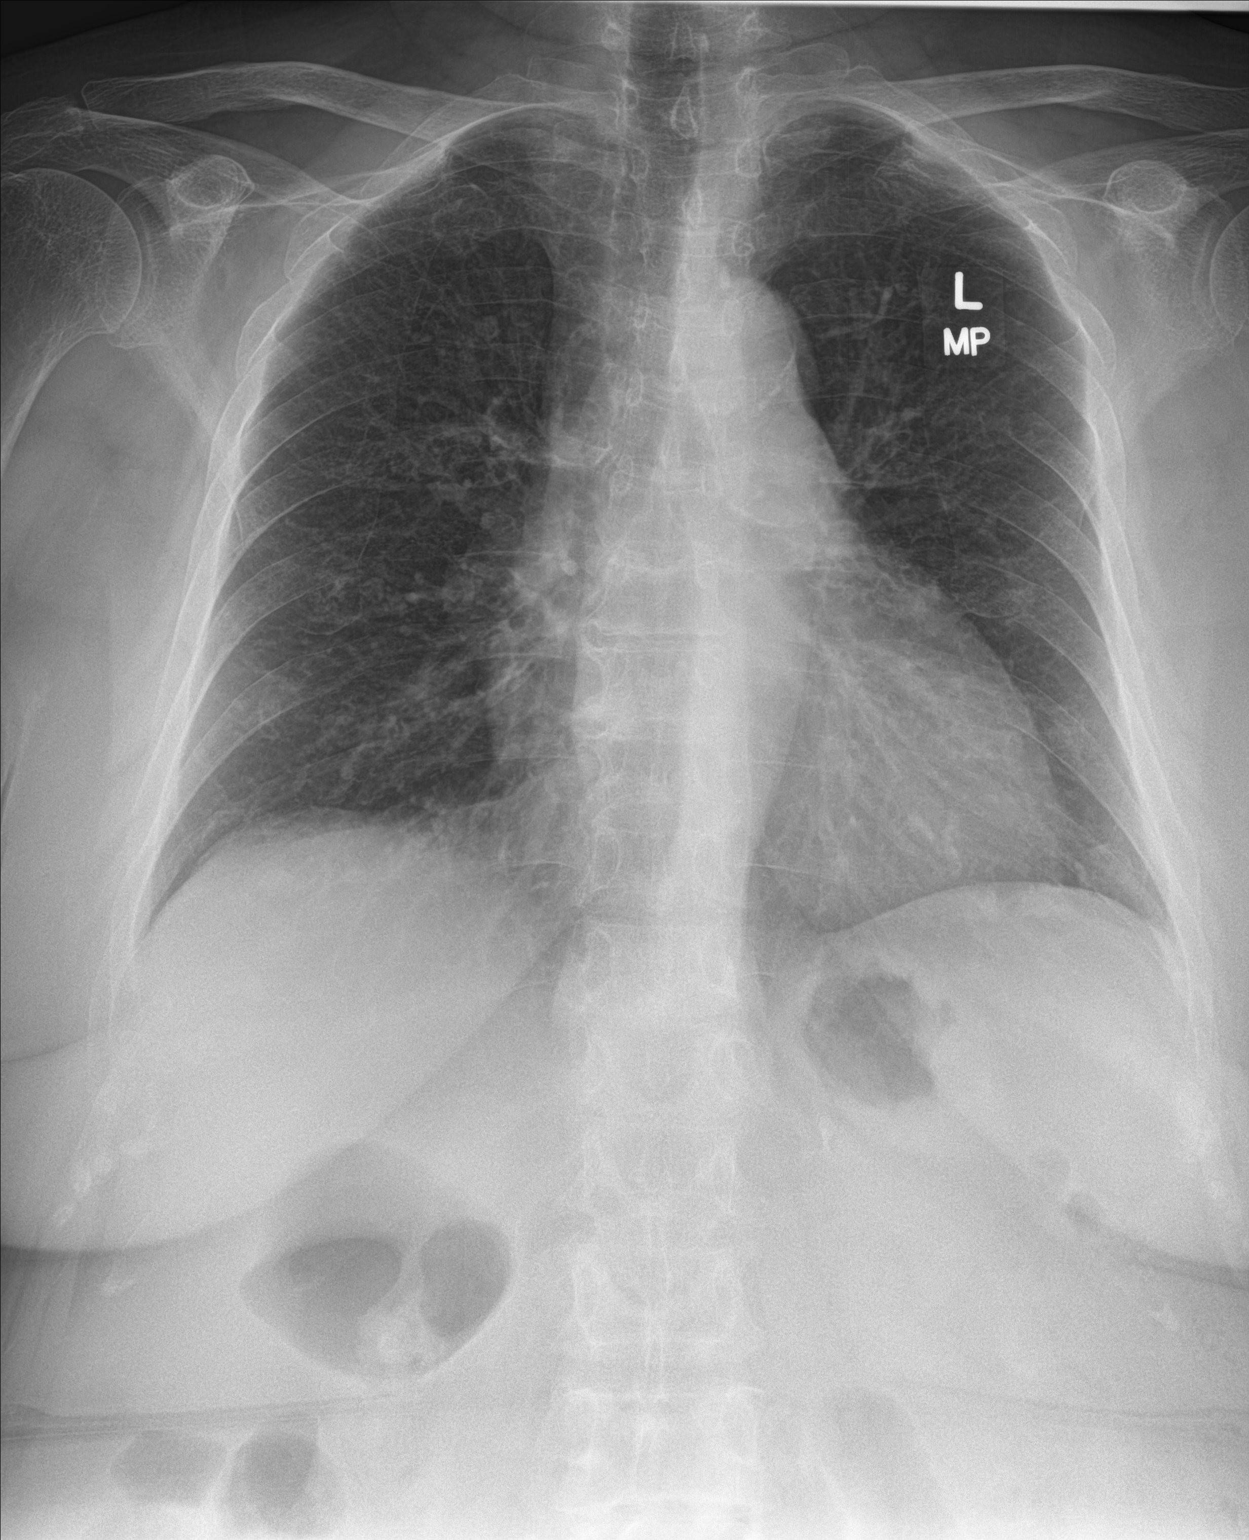

[t-spine lat]
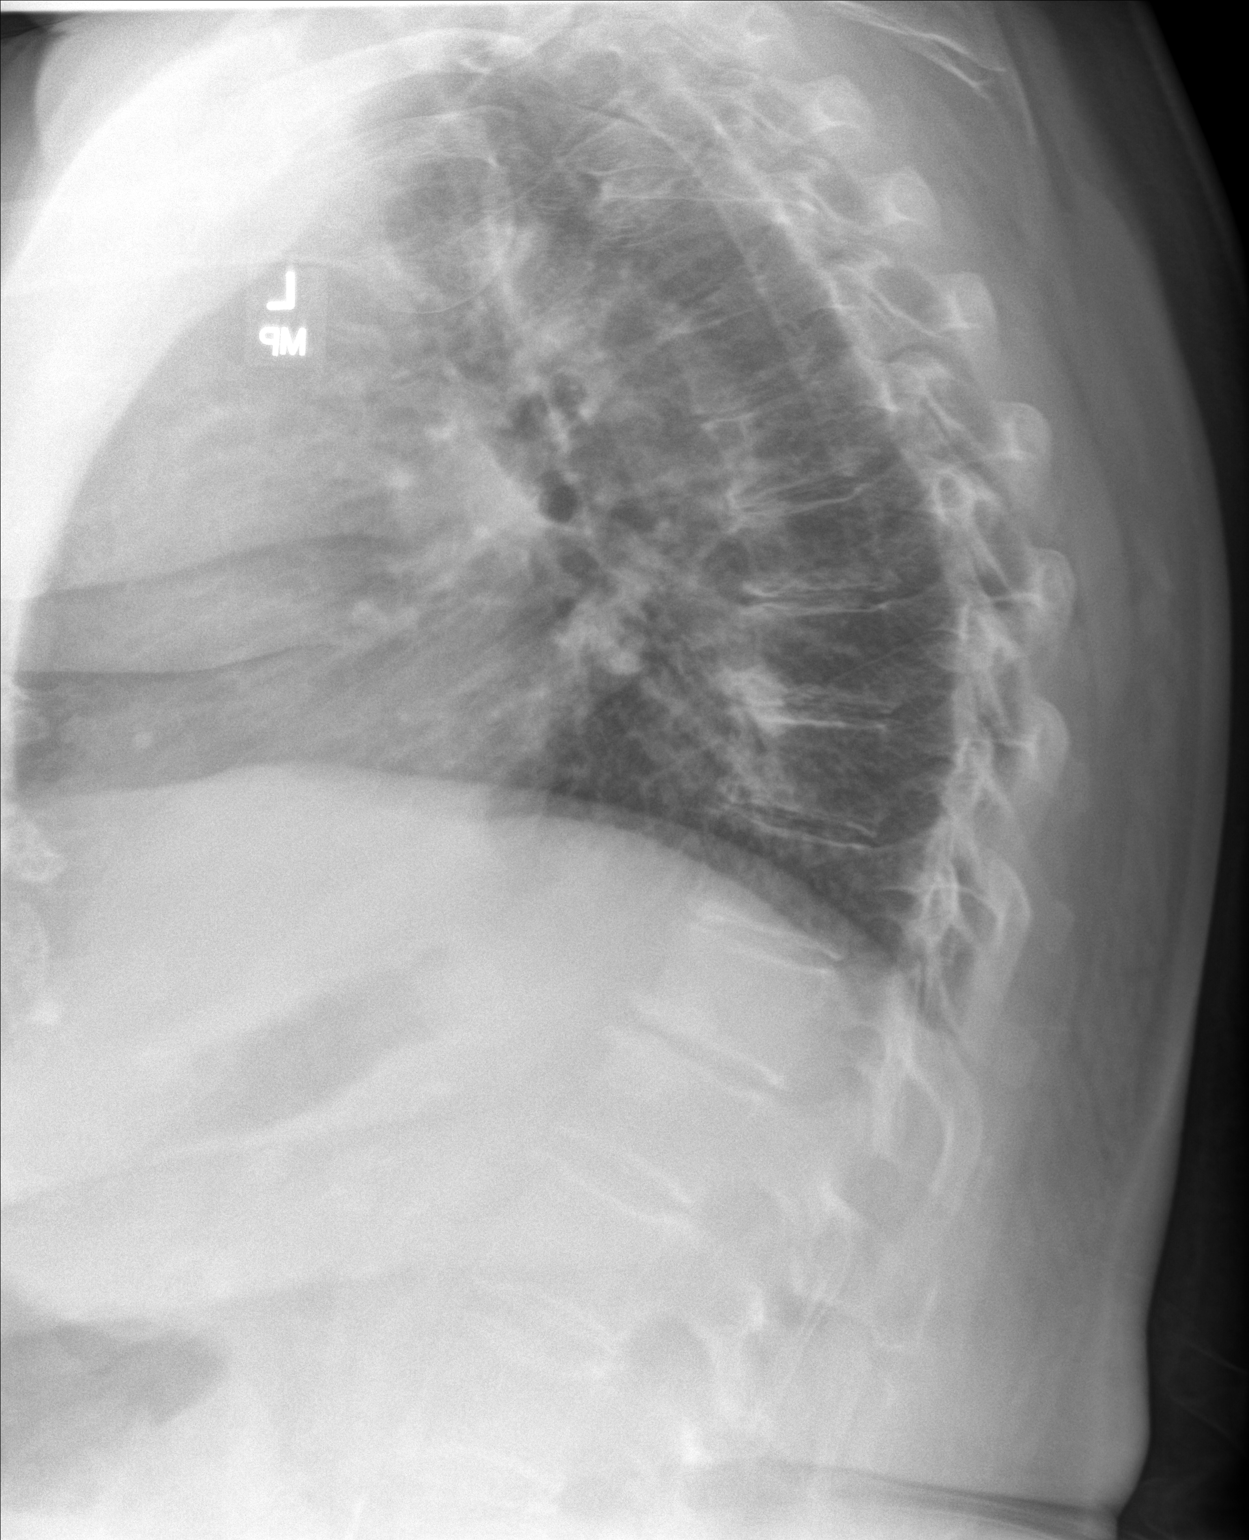

[t-spine lat swimmers]
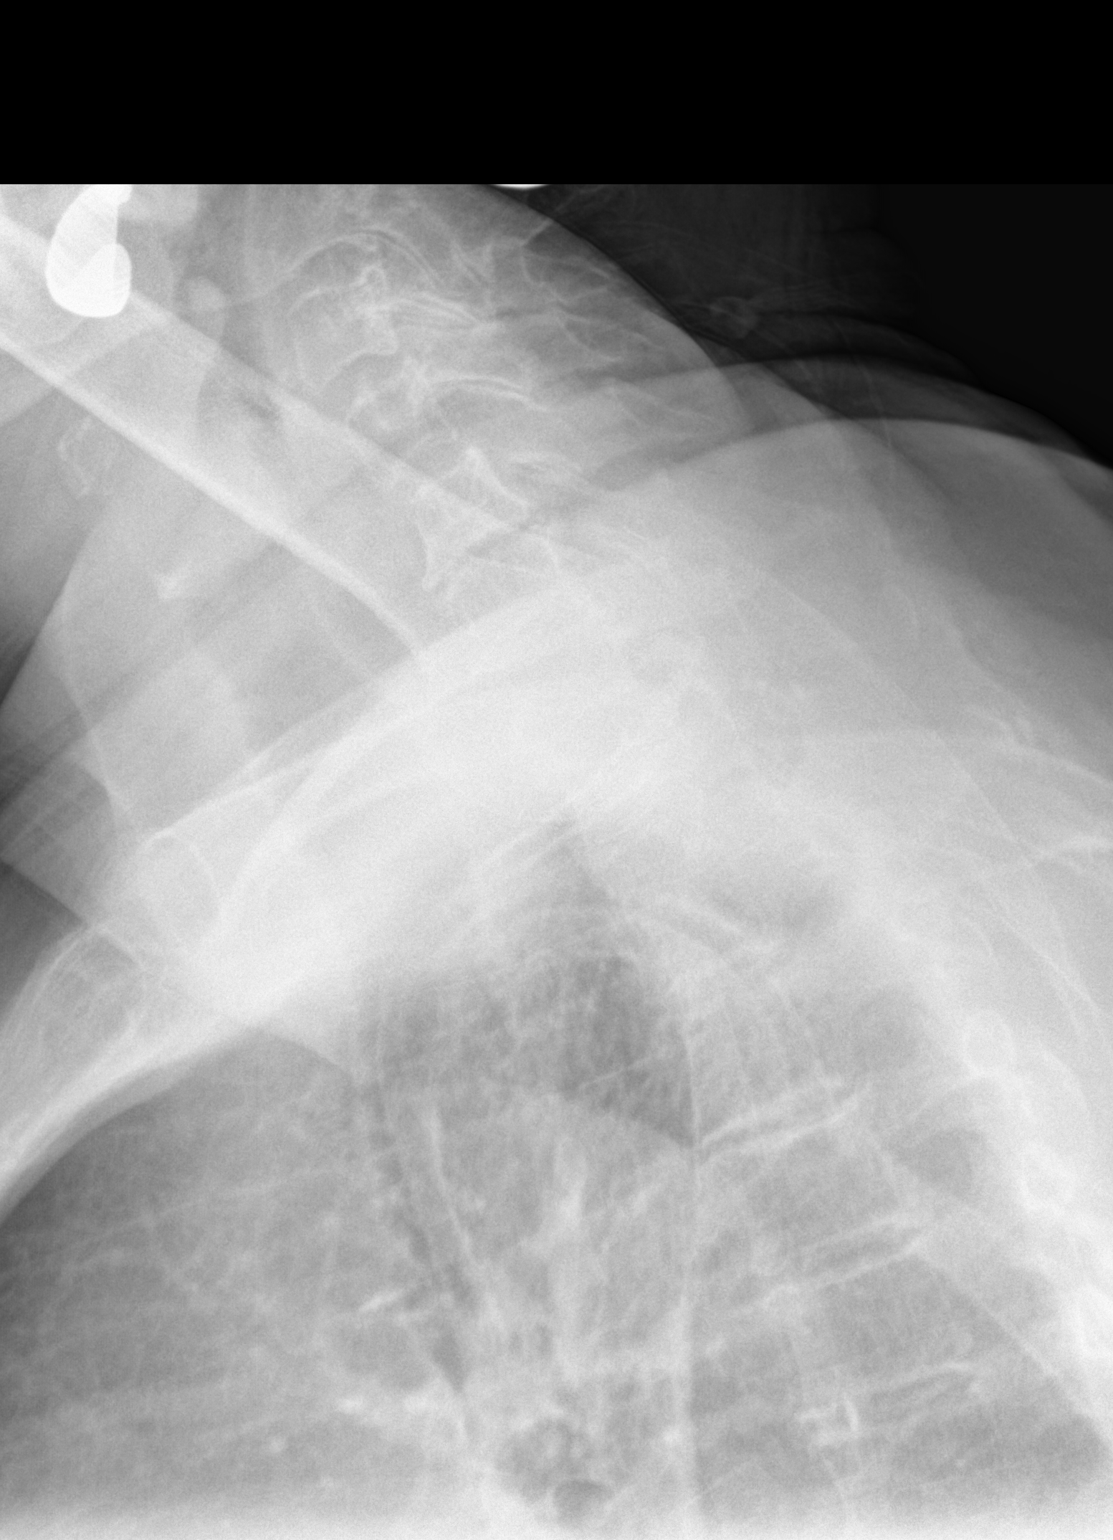

[3 of 3 positions shown; findings below may reference images not displayed]

FINDINGS: Heart and mediastinal contours are within normal limits. No focal
opacities or effusions. No acute bony abnormality. Mild degenerative
spurring throughout the thoracic spine. No fracture or malalignment.
No focal bone lesion.
IMPRESSION: No acute bony abnormality.

No acute cardiopulmonary disease.

## 2020-03-29 DIAGNOSIS — M542 Cervicalgia: Secondary | ICD-10-CM | POA: Diagnosis not present

## 2020-04-04 DIAGNOSIS — M542 Cervicalgia: Secondary | ICD-10-CM | POA: Diagnosis not present

## 2020-04-08 DIAGNOSIS — H401123 Primary open-angle glaucoma, left eye, severe stage: Secondary | ICD-10-CM | POA: Diagnosis not present

## 2020-04-11 DIAGNOSIS — M542 Cervicalgia: Secondary | ICD-10-CM | POA: Diagnosis not present

## 2020-04-18 DIAGNOSIS — M542 Cervicalgia: Secondary | ICD-10-CM | POA: Diagnosis not present

## 2020-04-22 DIAGNOSIS — H401111 Primary open-angle glaucoma, right eye, mild stage: Secondary | ICD-10-CM | POA: Diagnosis not present

## 2020-05-14 ENCOUNTER — Other Ambulatory Visit: Payer: Self-pay | Admitting: Family

## 2020-05-14 DIAGNOSIS — M159 Polyosteoarthritis, unspecified: Secondary | ICD-10-CM

## 2020-06-07 DIAGNOSIS — H401111 Primary open-angle glaucoma, right eye, mild stage: Secondary | ICD-10-CM | POA: Diagnosis not present

## 2020-06-07 DIAGNOSIS — H16223 Keratoconjunctivitis sicca, not specified as Sjogren's, bilateral: Secondary | ICD-10-CM | POA: Diagnosis not present

## 2020-06-07 DIAGNOSIS — H401123 Primary open-angle glaucoma, left eye, severe stage: Secondary | ICD-10-CM | POA: Diagnosis not present

## 2020-06-07 DIAGNOSIS — E119 Type 2 diabetes mellitus without complications: Secondary | ICD-10-CM | POA: Diagnosis not present

## 2020-06-07 DIAGNOSIS — Z961 Presence of intraocular lens: Secondary | ICD-10-CM | POA: Diagnosis not present

## 2020-06-08 ENCOUNTER — Ambulatory Visit (INDEPENDENT_AMBULATORY_CARE_PROVIDER_SITE_OTHER): Payer: Medicare Other

## 2020-06-08 ENCOUNTER — Ambulatory Visit (INDEPENDENT_AMBULATORY_CARE_PROVIDER_SITE_OTHER): Payer: Medicare Other | Admitting: Family

## 2020-06-08 ENCOUNTER — Other Ambulatory Visit: Payer: Self-pay | Admitting: Family Medicine

## 2020-06-08 ENCOUNTER — Other Ambulatory Visit: Payer: Self-pay

## 2020-06-08 ENCOUNTER — Encounter: Payer: Self-pay | Admitting: Family

## 2020-06-08 VITALS — BP 143/70 | HR 78 | Temp 97.8°F | Ht 61.0 in | Wt 211.4 lb

## 2020-06-08 DIAGNOSIS — I10 Essential (primary) hypertension: Secondary | ICD-10-CM

## 2020-06-08 DIAGNOSIS — R0602 Shortness of breath: Secondary | ICD-10-CM | POA: Diagnosis not present

## 2020-06-08 DIAGNOSIS — R609 Edema, unspecified: Secondary | ICD-10-CM

## 2020-06-08 DIAGNOSIS — K219 Gastro-esophageal reflux disease without esophagitis: Secondary | ICD-10-CM

## 2020-06-08 DIAGNOSIS — R42 Dizziness and giddiness: Secondary | ICD-10-CM | POA: Diagnosis not present

## 2020-06-08 DIAGNOSIS — M159 Polyosteoarthritis, unspecified: Secondary | ICD-10-CM

## 2020-06-08 MED ORDER — VALSARTAN-HYDROCHLOROTHIAZIDE 160-12.5 MG PO TABS: ORAL_TABLET | ORAL | 3 refills | Status: DC

## 2020-06-08 MED ORDER — VALSARTAN-HYDROCHLOROTHIAZIDE 160-25 MG PO TABS
1.0000 | ORAL_TABLET | Freq: Every day | ORAL | 1 refills | Status: DC
Start: 2020-06-08 — End: 2020-09-08

## 2020-06-08 MED ORDER — MELOXICAM 7.5 MG PO TABS: 7.5000 mg | ORAL_TABLET | Freq: Two times a day (BID) | ORAL | 2 refills | Status: DC

## 2020-06-08 NOTE — Patient Instructions (Signed)
Peripheral Edema  Peripheral edema is swelling that is caused by a buildup of fluid. Peripheral edema most often affects the lower legs, ankles, and feet. It can also develop in the arms, hands, and face. The area of the body that has peripheral edema will look swollen. It may also feel heavy or warm. Your clothes may start to feel tight. Pressing on the area may make a temporary dent in your skin. You may not be able to move your swollen arm or leg as much as usual. There are many causes of peripheral edema. It can happen because of a complication of other conditions such as congestive heart failure, kidney disease, or a problem with your blood circulation. It also can be a side effect of certain medicines or because of an infection. It often happens to women during pregnancy. Sometimes, the cause is not known. Follow these instructions at home: Managing pain, stiffness, and swelling   Raise (elevate) your legs while you are sitting or lying down.  Move around often to prevent stiffness and to lessen swelling.  Do not sit or stand for long periods of time.  Wear support stockings as told by your health care provider. Medicines  Take over-the-counter and prescription medicines only as told by your health care provider.  Your health care provider may prescribe medicine to help your body get rid of excess water (diuretic). General instructions  Pay attention to any changes in your symptoms.  Follow instructions from your health care provider about limiting salt (sodium) in your diet. Sometimes, eating less salt may reduce swelling.  Moisturize skin daily to help prevent skin from cracking and draining.  Keep all follow-up visits as told by your health care provider. This is important. Contact a health care provider if you have:  A fever.  Edema that starts suddenly or is getting worse, especially if you are pregnant or have a medical condition.  Swelling in only one leg.  Increased  swelling, redness, or pain in one or both of your legs.  Drainage or sores at the area where you have edema. Get help right away if you:  Develop shortness of breath, especially when you are lying down.  Have pain in your chest or abdomen.  Feel weak.  Feel faint. Summary  Peripheral edema is swelling that is caused by a buildup of fluid. Peripheral edema most often affects the lower legs, ankles, and feet.  Move around often to prevent stiffness and to lessen swelling. Do not sit or stand for long periods of time.  Pay attention to any changes in your symptoms.  Contact a health care provider if you have edema that starts suddenly or is getting worse, especially if you are pregnant or have a medical condition.  Get help right away if you develop shortness of breath, especially when lying down. This information is not intended to replace advice given to you by your health care provider. Make sure you discuss any questions you have with your health care provider. Document Revised: 08/20/2018 Document Reviewed: 08/20/2018 Elsevier Patient Education  2020 Elsevier Inc.  

## 2020-06-08 NOTE — Progress Notes (Signed)
Subjective:    Patient ID: Robin Lindsey, female    DOB: [birth year], 74 y.o.   MRN: [id number]  Chief Complaint  Patient presents with  . Joint Swelling    ankles  . Shortness of Breath    with chest tighness  . Neck Pain    wants mobic written for 2 a day   . Dizziness    With laying down if turns head to the right it Will go away   PT presents to the office today with bilateral ankle swelling that started over the last month. She reports the swelling is worse by the end of the day, but improves in the morning after she has kept her feet elevated.   She continues to have intermittent SOB with exertion. She saw a Cardiologists last year about this and had a negative CT coronary and normal ECHO.  Shortness of Breath This is a chronic problem. The current episode started more than 1 year ago. The problem occurs every few minutes. The problem has been waxing and waning. Associated symptoms include neck pain.  Neck Pain   Dizziness This is a recurrent problem. The current episode started more than 1 month ago. The problem occurs intermittently. The problem has been waxing and waning. Associated symptoms include neck pain. She has tried position changes for the symptoms. The treatment provided moderate relief.      Review of Systems  Respiratory: Positive for shortness of breath.   Musculoskeletal: Positive for neck pain.  Neurological: Positive for dizziness.       Objective:   Physical Exam Vitals reviewed.  Constitutional:      General: She is not in acute distress.    Appearance: She is well-developed.  HENT:     Head: Normocephalic and atraumatic.     Right Ear: External ear normal.  Eyes:     Pupils: Pupils are equal, round, and reactive to light.  Neck:     Thyroid: No thyromegaly.  Cardiovascular:     Rate and Rhythm: Normal rate and regular rhythm.     Heart sounds: Normal heart sounds. No murmur heard.   Pulmonary:     Effort: Pulmonary effort is normal. No  respiratory distress.     Breath sounds: Normal breath sounds. No wheezing.  Abdominal:     General: Bowel sounds are normal. There is no distension.     Palpations: Abdomen is soft.     Tenderness: There is no abdominal tenderness.  Musculoskeletal:        General: No tenderness. Normal range of motion.     Cervical back: Normal range of motion and neck supple.     Right lower leg: Edema (2+) present.     Left lower leg: Edema (2+) present.  Skin:    General: Skin is warm and dry.  Neurological:     Mental Status: She is alert and oriented to person, place, and time.     Cranial Nerves: No cranial nerve deficit.     Deep Tendon Reflexes: Reflexes are normal and symmetric.  Psychiatric:        Behavior: Behavior normal.        Thought Content: Thought content normal.        Judgment: Judgment normal.       BP (!) 143/70   Pulse 78   Temp 97.8 F (36.6 C) (Temporal)   Ht 5' 1" (1.549 m)   Wt 211 lb 6.4 oz (95.9 kg)   SpO2  98%   BMI 39.94 kg/m      Assessment & Plan:  [name] comes in today with chief complaint of Joint Swelling (ankles), Shortness of Breath (with chest tighness), Neck Pain (wants mobic written for 2 a day ), and Dizziness (With laying down if turns head to the right it Will go away)   Diagnosis and orders addressed:  1. SOB (shortness of breath) - DG Chest 2 View; Future - EKG 12-Lead - CMP14+EGFR - CBC with Differential/Platelet  2. Vertigo Continue Epley exercises Antivert as needed  - CMP14+EGFR - CBC with Differential/Platelet  3. Peripheral edema Start wearing compression  Low salt diet Keep elevated when possible  - Compression stockings - CMP14+EGFR - CBC with Differential/Platelet  4. Essential hypertension -Will increase HCTZ to 25 mg from 12.5 mg - CMP14+EGFR - CBC with Differential/Platelet  5. Osteoarthritis of multiple joints, unspecified osteoarthritis type - meloxicam (MOBIC) 7.5 MG tablet; Take 1 tablet (7.5  mg total) by mouth in the morning and at bedtime.  Dispense: 180 tablet; Refill: 2   Labs pending Health Maintenance reviewed Diet and exercise encouraged  Follow up plan: 2-4 weeks    Robin Dun, FNP

## 2020-06-09 LAB — CBC WITH DIFFERENTIAL/PLATELET
Basophils Absolute: 0 10*3/uL (ref 0.0–0.2)
Basos: 1 %
EOS (ABSOLUTE): 0.2 10*3/uL (ref 0.0–0.4)
Eos: 3 %
Hematocrit: 40.1 % (ref 34.0–46.6)
Hemoglobin: 13.2 g/dL (ref 11.1–15.9)
Immature Grans (Abs): 0 10*3/uL (ref 0.0–0.1)
Immature Granulocytes: 0 %
Lymphocytes Absolute: 2.8 10*3/uL (ref 0.7–3.1)
Lymphs: 35 %
MCH: 30.9 pg (ref 26.6–33.0)
MCHC: 32.9 g/dL (ref 31.5–35.7)
MCV: 94 fL (ref 79–97)
Monocytes Absolute: 0.6 10*3/uL (ref 0.1–0.9)
Monocytes: 8 %
Neutrophils Absolute: 4.3 10*3/uL (ref 1.4–7.0)
Neutrophils: 53 %
Platelets: 236 10*3/uL (ref 150–450)
RBC: 4.27 x10E6/uL (ref 3.77–5.28)
RDW: 12.3 % (ref 11.7–15.4)
WBC: 8.1 10*3/uL (ref 3.4–10.8)

## 2020-06-09 LAB — CMP14+EGFR
ALT: 14 IU/L (ref 0–32)
AST: 16 IU/L (ref 0–40)
Albumin/Globulin Ratio: 1.8 (ref 1.2–2.2)
Albumin: 3.9 g/dL (ref 3.7–4.7)
Alkaline Phosphatase: 81 IU/L (ref 48–121)
BUN/Creatinine Ratio: 20 (ref 12–28)
BUN: 21 mg/dL (ref 8–27)
Bilirubin Total: 0.2 mg/dL (ref 0.0–1.2)
CO2: 26 mmol/L (ref 20–29)
Calcium: 9 mg/dL (ref 8.7–10.3)
Chloride: 103 mmol/L (ref 96–106)
Creatinine, Ser: 1.06 mg/dL — ABNORMAL HIGH (ref 0.57–1.00)
GFR calc Af Amer: 59 mL/min/{1.73_m2} — ABNORMAL LOW (ref >59)
GFR calc non Af Amer: 51 mL/min/{1.73_m2} — ABNORMAL LOW (ref >59)
Globulin, Total: 2.2 g/dL (ref 1.5–4.5)
Glucose: 110 mg/dL — ABNORMAL HIGH (ref 65–99)
Potassium: 4.6 mmol/L (ref 3.5–5.2)
Sodium: 141 mmol/L (ref 134–144)
Total Protein: 6.1 g/dL (ref 6.0–8.5)

## 2020-06-10 ENCOUNTER — Telehealth: Payer: Self-pay | Admitting: Family

## 2020-06-10 NOTE — Telephone Encounter (Signed)
Reviewed chest xray results with patient.

## 2020-06-22 ENCOUNTER — Ambulatory Visit (INDEPENDENT_AMBULATORY_CARE_PROVIDER_SITE_OTHER): Payer: Medicare Other | Admitting: *Deleted

## 2020-06-22 DIAGNOSIS — I10 Essential (primary) hypertension: Secondary | ICD-10-CM

## 2020-06-22 DIAGNOSIS — E1169 Type 2 diabetes mellitus with other specified complication: Secondary | ICD-10-CM

## 2020-06-22 DIAGNOSIS — E1159 Type 2 diabetes mellitus with other circulatory complications: Secondary | ICD-10-CM

## 2020-06-22 NOTE — Patient Instructions (Signed)
Visit Information  Goals Addressed            This Visit's Progress   . Chronic Disease Management Needs       CARE PLAN ENTRY (see longtitudinal plan of care for additional care plan information)  Current Barriers:  . Chronic Disease Management support, education, and care coordination needs related to HTN, DM, arthritis, osteopenia, glaucoma, GAD  Clinical Goal(s) related to HTN, DM, arthritis, osteopenia, glaucoma, GAD:  Over the next 60 days, patient will:  . Work with the care management team to address educational, disease management, and care coordination needs  . Begin or continue self health monitoring activities as directed today Measure and record cbg (blood glucose) 3 times weekly and Measure and record blood pressure 3 times per week . Call provider office for new or worsened signs and symptoms Blood glucose findings outside established parameters and Blood pressure findings outside established parameters . Call care management team with questions or concerns . Verbalize basic understanding of patient centered plan of care established today  Interventions related to HTN, DM, arthritis, osteopenia, glaucoma, GAD:  . Evaluation of current treatment plans and patient's adherence to plan as established by provider . Assessed patient understanding of disease states . Assessed patient's education and care coordination needs . Provided disease specific education to patient  . Collaborated with appropriate clinical care team members regarding patient need . Reviewed upcoming appts: PCP 06/24/20 . Chart reviewed including recent office notes and lab results . Discussed recent lab results. A1C to be done at next visit. Marland Kitchen Discussed diabetes management o Does not check blood sugar regularly but has glucometer o Does not take medication for diabetes. Diet regulated.  . Discussed HTN management o Has blood pressure monitor. Checks sporadically.  o Has had some lower extremity edema  and HCTZ was added at last PCP visit . Discussed glaucoma management o Sees ophthalmologist every 3 months . Discussed physical activity level . Provided with RN Care Manager telephone number and encouraged patient to reach out as needed . Appointment with [facility] scheduled for 08/23/20  Patient Self Care Activities related to HTN, DM, arthritis, osteopenia, glaucoma, GAD:  . Patient is able to independently perform ADLs and IADLs   Initial goal documentation        Ms. Kuwahara was given information about Chronic Care Management services today including:  1. CCM service includes personalized support from designated clinical staff supervised by her physician, including individualized plan of care and coordination with other care providers 2. 24/7 contact phone numbers for assistance for urgent and routine care needs. 3. Service will only be billed when office clinical staff spend 20 minutes or more in a month to coordinate care. 4. Only one practitioner may furnish and bill the service in a calendar month. 5. The patient may stop CCM services at any time (effective at the end of the month) by phone call to the office staff. 6. The patient will be responsible for cost sharing (co-pay) of up to 20% of the service fee (after annual deductible is met).  Patient agreed to services and verbal consent obtained.   Patient verbalizes understanding of instructions provided today.   Telephone follow up appointment with care management team member scheduled for: 08/23/20 PCP appt scheduled for 06/24/20  Chong Sicilian, BSN, RN-BC [place] [facility] [place] [facility] Direct Dial: [phone]

## 2020-06-22 NOTE — Chronic Care Management (AMB) (Addendum)
Chronic Care Management   Initial Visit Note  06/22/2020 Name: Robin Lindsey MRN: [id number] DOB: 1946-07-15  Referred by: Robin Balloon, FNP Reason for referral : Chronic Care Management (Initial Visit)   Robin Lindsey is a 74 y.o. year old female who is a primary care patient of Robin Balloon, FNP. The CCM team was consulted for assistance with chronic disease management and care coordination needs related to HTN, DM, arthritis, osteopenia, glaucoma, GAD.  Review of patient status, including review of consultants reports, relevant laboratory and other test results, and collaboration with appropriate care team members and the patient's provider was performed as part of comprehensive patient evaluation and provision of chronic care management services.    Subjective: I spoke with Robin Lindsey by telephone today regarding management of her chronic medical conditions.   SDOH (Social Determinants of Health) assessments performed: Yes See Care Plan activities for detailed interventions related to SDOH  SDOH Interventions      Most Recent Value  SDOH Interventions  Physical Activity Interventions Other (Comments)  [Encouraged to increase moderate physical activity level. Per patient, she is active around her home throughout most of the day]        Objective: Outpatient Encounter Medications as of 06/22/2020  Medication Sig   BAYER MICROLET LANCETS lancets Use to check BG daily   cyclobenzaprine (FLEXERIL) 10 MG tablet Take 1 tablet (10 mg total) by mouth 3 (three) times daily as needed for muscle spasms.   fluticasone (FLONASE) 50 MCG/ACT nasal spray Place 2 sprays into both nostrils daily.   glucose blood (BAYER CONTOUR NEXT TEST) test strip Use to check BG daily   loratadine (CLARITIN) 10 MG tablet Take 10 mg by mouth daily.   LUMIGAN 0.01 % SOLN Place into both eyes daily.    meloxicam (MOBIC) 7.5 MG tablet Take 1 tablet (7.5 mg total) by mouth in the morning and at bedtime.    omeprazole (PRILOSEC) 20 MG capsule TAKE ONE CAPSULE BY MOUTH TWICE A DAY BEFORE MEALS   OVER THE COUNTER MEDICATION Patient takes 1/4 teaspoon of hemp oil by mouth daily   valsartan-hydrochlorothiazide (DIOVAN HCT) 160-25 MG tablet Take 1 tablet by mouth daily.   No facility-administered encounter medications on file as of 06/22/2020.    Lab Results  Component Value Date   HGBA1C 6.1 07/02/2019   HGBA1C 6.3 01/01/2019   HGBA1C 6.2 06/30/2018   Lab Results  Component Value Date   LDLCALC 65 01/01/2019   CREATININE 1.06 (H) 06/08/2020   BP Readings from Last 3 Encounters:  06/08/20 (!) 143/70  11/12/19 (!) 157/92  09/18/19 137/70    RN Care Plan     Chronic Disease Management Needs       CARE PLAN ENTRY (see longtitudinal plan of care for additional care plan information)  Current Barriers:  Chronic Disease Management support, education, and care coordination needs related to HTN, DM, arthritis, osteopenia, glaucoma, GAD  Clinical Goal(s) related to HTN, DM, arthritis, osteopenia, glaucoma, GAD:  Over the next 60 days, patient will:  Work with the care management team to address educational, disease management, and care coordination needs  Begin or continue self health monitoring activities as directed today Measure and record cbg (blood glucose) 3 times weekly and Measure and record blood pressure 3 times per week Call provider office for new or worsened signs and symptoms Blood glucose findings outside established parameters and Blood pressure findings outside established parameters Call care management team with questions or  concerns Verbalize basic understanding of patient centered plan of care established today  Interventions related to HTN, DM, arthritis, osteopenia, glaucoma, GAD:  Evaluation of current treatment plans and patient's adherence to plan as established by provider Assessed patient understanding of disease states Assessed patient's education and care  coordination needs Provided disease specific education to patient  Collaborated with appropriate clinical care team members regarding patient need Reviewed upcoming appts: PCP 06/24/20 Chart reviewed including recent office notes and lab results Discussed recent lab results. A1C to be done at next visit. Discussed diabetes management Does not check blood sugar regularly but has glucometer Does not take medication for diabetes. Diet regulated.  Discussed HTN management Has blood pressure monitor. Checks sporadically.  Has had some lower extremity edema and HCTZ was added at last PCP visit Discussed glaucoma management Sees ophthalmologist every 3 months Discussed physical activity level Provided with [facility] telephone number and encouraged patient to reach out as needed Appointment with RNCM scheduled for 08/23/20  Patient Self Care Activities related to HTN, DM, arthritis, osteopenia, glaucoma, GAD:  Patient is able to independently perform ADLs and IADLs   Initial goal documentation          Plan:    Telephone follow up appointment with care management team member scheduled for: 08/23/20 PCP appt 06/24/20  Robin Lindsey, BSN, RN-BC [place] [facility] [place] [facility] Direct Dial: [phone]    I have reviewed the CCM documentation and agree with the written assessment and plan of care.  Robin Dun, FNP

## 2020-06-24 ENCOUNTER — Encounter: Payer: Self-pay | Admitting: Family

## 2020-06-24 ENCOUNTER — Ambulatory Visit (INDEPENDENT_AMBULATORY_CARE_PROVIDER_SITE_OTHER): Payer: Medicare Other | Admitting: Family

## 2020-06-24 ENCOUNTER — Other Ambulatory Visit: Payer: Self-pay

## 2020-06-24 VITALS — BP 131/71 | HR 76 | Temp 97.9°F | Ht 61.0 in | Wt 206.2 lb

## 2020-06-24 DIAGNOSIS — E1159 Type 2 diabetes mellitus with other circulatory complications: Secondary | ICD-10-CM | POA: Diagnosis not present

## 2020-06-24 DIAGNOSIS — I10 Essential (primary) hypertension: Secondary | ICD-10-CM | POA: Diagnosis not present

## 2020-06-24 MED ORDER — ALBUTEROL SULFATE HFA 108 (90 BASE) MCG/ACT IN AERS
2.0000 | INHALATION_SPRAY | Freq: Four times a day (QID) | RESPIRATORY_TRACT | 0 refills | Status: DC | PRN
Start: 2020-06-24 — End: 2021-06-05

## 2020-06-24 NOTE — Progress Notes (Signed)
   Subjective:    Patient ID: Robin Lindsey, female    DOB: 1946/09/02, 74 y.o.   MRN: [id number]  Chief Complaint  Patient presents with  . Hypertension    2 WEEK FOLLOW UP   PT presents to the office today to recheck HTN. PT's BP is at goal. We increased her HCTZ to 25 mg.  Hypertension This is a chronic problem. The current episode started more than 1 year ago. The problem has been resolved since onset. The problem is controlled. Associated symptoms include peripheral edema and shortness of breath ("at times"). Pertinent negatives include no malaise/fatigue. Risk factors for coronary artery disease include obesity and sedentary lifestyle. The current treatment provides moderate improvement.      Review of Systems  Constitutional: Negative for malaise/fatigue.  Respiratory: Positive for shortness of breath ("at times").   All other systems reviewed and are negative.      Objective:   Physical Exam Vitals reviewed.  Constitutional:      General: She is not in acute distress.    Appearance: She is well-developed.  HENT:     Head: Normocephalic and atraumatic.     Right Ear: Tympanic membrane normal.     Left Ear: Tympanic membrane normal.  Eyes:     Pupils: Pupils are equal, round, and reactive to light.  Neck:     Thyroid: No thyromegaly.  Cardiovascular:     Rate and Rhythm: Normal rate and regular rhythm.     Heart sounds: Normal heart sounds. No murmur heard.   Pulmonary:     Effort: Pulmonary effort is normal. No respiratory distress.     Breath sounds: Normal breath sounds. No wheezing.  Abdominal:     General: Bowel sounds are normal. There is no distension.     Palpations: Abdomen is soft.     Tenderness: There is no abdominal tenderness.  Musculoskeletal:        General: No tenderness. Normal range of motion.     Cervical back: Normal range of motion and neck supple.     Right lower leg: Edema (trace) present.     Left lower leg: Edema (trace) present.    Skin:    General: Skin is warm and dry.  Neurological:     Mental Status: She is alert and oriented to person, place, and time.     Cranial Nerves: No cranial nerve deficit.     Deep Tendon Reflexes: Reflexes are normal and symmetric.  Psychiatric:        Behavior: Behavior normal.        Thought Content: Thought content normal.        Judgment: Judgment normal.      BP 131/71   Pulse 76   Temp 97.9 F (36.6 C) (Temporal)   Ht _0  (1.549 m)   Wt 206 lb 3.2 oz (93.5 kg)   BMI 38.96 kg/m       Assessment & Plan:  Robin Lindsey comes in today with chief complaint of Hypertension (2 WEEK FOLLOW UP)   Diagnosis and orders addressed: 1. Hypertension associated with diabetes ([place]) Continue medications  -Dash diet information given -Exercise encouraged - Stress Management  -Continue current meds -RTO in 6 months  - BMP8+EGFR   Evelina Dun, FNP

## 2020-06-24 NOTE — Patient Instructions (Signed)

## 2020-06-25 LAB — BMP8+EGFR
BUN/Creatinine Ratio: 15 (ref 12–28)
BUN: 17 mg/dL (ref 8–27)
CO2: 24 mmol/L (ref 20–29)
Calcium: 9.2 mg/dL (ref 8.7–10.3)
Chloride: 101 mmol/L (ref 96–106)
Creatinine, Ser: 1.12 mg/dL — ABNORMAL HIGH (ref 0.57–1.00)
GFR calc Af Amer: 55 mL/min/{1.73_m2} — ABNORMAL LOW (ref >59)
GFR calc non Af Amer: 48 mL/min/{1.73_m2} — ABNORMAL LOW (ref >59)
Glucose: 108 mg/dL — ABNORMAL HIGH (ref 65–99)
Potassium: 4.6 mmol/L (ref 3.5–5.2)
Sodium: 139 mmol/L (ref 134–144)

## 2020-07-04 DIAGNOSIS — M47812 Spondylosis without myelopathy or radiculopathy, cervical region: Secondary | ICD-10-CM | POA: Diagnosis not present

## 2020-07-04 DIAGNOSIS — M546 Pain in thoracic spine: Secondary | ICD-10-CM | POA: Diagnosis not present

## 2020-07-04 DIAGNOSIS — M542 Cervicalgia: Secondary | ICD-10-CM | POA: Diagnosis not present

## 2020-07-06 DIAGNOSIS — M542 Cervicalgia: Secondary | ICD-10-CM | POA: Diagnosis not present

## 2020-07-06 DIAGNOSIS — M546 Pain in thoracic spine: Secondary | ICD-10-CM | POA: Diagnosis not present

## 2020-07-06 DIAGNOSIS — M47812 Spondylosis without myelopathy or radiculopathy, cervical region: Secondary | ICD-10-CM | POA: Diagnosis not present

## 2020-07-08 DIAGNOSIS — M542 Cervicalgia: Secondary | ICD-10-CM | POA: Diagnosis not present

## 2020-07-08 DIAGNOSIS — M546 Pain in thoracic spine: Secondary | ICD-10-CM | POA: Diagnosis not present

## 2020-07-08 DIAGNOSIS — M47812 Spondylosis without myelopathy or radiculopathy, cervical region: Secondary | ICD-10-CM | POA: Diagnosis not present

## 2020-08-23 ENCOUNTER — Ambulatory Visit: Payer: Medicare Other | Admitting: *Deleted

## 2020-08-23 DIAGNOSIS — E1159 Type 2 diabetes mellitus with other circulatory complications: Secondary | ICD-10-CM

## 2020-08-23 DIAGNOSIS — E1169 Type 2 diabetes mellitus with other specified complication: Secondary | ICD-10-CM

## 2020-08-23 NOTE — Chronic Care Management (AMB) (Addendum)
  Chronic Care Management   Follow Up Note   08/23/2020 Name: Robin Lindsey MRN: [id number] DOB: 04/01/1946  Referred by: Robin Balloon, FNP Reason for referral : Chronic Care Management (RN Follow up)   Robin Lindsey is a 74 y.o. year old female who is a primary care patient of Robin Balloon, FNP. The CCM team was consulted for assistance with chronic disease management and care coordination needs.   I spoke with Robin Lindsey today regarding management of her chronic medical conditions. She does does not have any resource or CCM needs and feels that her chronic medical conditions are well controlled at this time. She appreciated the outreach but would like to be removed from the CCM program and will reach out if services are needed in the future.   Plan:   CCM enrollment status changed to "previously enrolled" as per patient request on 08/23/2020 to discontinue enrollment. Case closed to case management services in primary care home.   Robin Lindsey, BSN, RN-BC Embedded Chronic Care Manager [facility] [place] Management Direct Dial: [phone]    I have reviewed the CCM documentation and agree with the written assessment and plan of care.  Robin Dun, FNP

## 2020-08-23 NOTE — Patient Instructions (Signed)
Robin Lindsey, BSN, RN-BC Embedded Chronic Care Manager [facility] Direct Dial: [phone]    

## 2020-09-06 DIAGNOSIS — H401123 Primary open-angle glaucoma, left eye, severe stage: Secondary | ICD-10-CM | POA: Diagnosis not present

## 2020-09-06 DIAGNOSIS — H16223 Keratoconjunctivitis sicca, not specified as Sjogren's, bilateral: Secondary | ICD-10-CM | POA: Diagnosis not present

## 2020-09-06 DIAGNOSIS — Z961 Presence of intraocular lens: Secondary | ICD-10-CM | POA: Diagnosis not present

## 2020-09-06 DIAGNOSIS — H401111 Primary open-angle glaucoma, right eye, mild stage: Secondary | ICD-10-CM | POA: Diagnosis not present

## 2020-09-06 DIAGNOSIS — E119 Type 2 diabetes mellitus without complications: Secondary | ICD-10-CM | POA: Diagnosis not present

## 2020-09-08 ENCOUNTER — Other Ambulatory Visit: Payer: Self-pay | Admitting: Family

## 2020-10-07 ENCOUNTER — Other Ambulatory Visit: Payer: Self-pay | Admitting: Family

## 2020-10-07 DIAGNOSIS — Z1152 Encounter for screening for COVID-19: Secondary | ICD-10-CM

## 2020-10-12 ENCOUNTER — Ambulatory Visit (INDEPENDENT_AMBULATORY_CARE_PROVIDER_SITE_OTHER): Payer: Medicare Other

## 2020-10-12 ENCOUNTER — Other Ambulatory Visit: Payer: Self-pay

## 2020-10-12 DIAGNOSIS — Z1152 Encounter for screening for COVID-19: Secondary | ICD-10-CM

## 2020-10-12 DIAGNOSIS — Z23 Encounter for immunization: Secondary | ICD-10-CM

## 2020-10-13 ENCOUNTER — Telehealth: Payer: Self-pay

## 2020-10-13 LAB — SARS-COV-2 ANTIBODY, IGM: SARS-CoV-2 Antibody, IgM: NEGATIVE

## 2020-10-13 NOTE — Telephone Encounter (Signed)
Covid antibodies test is negative despite having both vaccines. Patient wants to know does that mean she is not protected?

## 2020-10-13 NOTE — Telephone Encounter (Signed)
PATIENT AWARE

## 2020-10-13 NOTE — Telephone Encounter (Signed)
I am sorry, but looks like the wrong the wrong test was ordered. The new lab is pending.

## 2020-10-15 LAB — SARS-COV-2 SEMI-QUANTITATIVE TOTAL ANTIBODY, SPIKE
SARS-CoV-2 Semi-Quant Total Ab: 615.4 U/mL (ref <0.8)
SARS-CoV-2 Spike Ab Interp: POSITIVE

## 2020-10-15 LAB — SPECIMEN STATUS REPORT

## 2020-11-22 ENCOUNTER — Other Ambulatory Visit: Payer: Self-pay | Admitting: Family

## 2020-12-21 ENCOUNTER — Other Ambulatory Visit: Payer: Medicare Other

## 2020-12-21 ENCOUNTER — Other Ambulatory Visit: Payer: Self-pay | Admitting: Internal Medicine

## 2020-12-21 DIAGNOSIS — Z20822 Contact with and (suspected) exposure to covid-19: Secondary | ICD-10-CM

## 2020-12-24 LAB — SPECIMEN STATUS REPORT

## 2020-12-24 LAB — NOVEL CORONAVIRUS, NAA

## 2020-12-26 ENCOUNTER — Encounter: Payer: Self-pay | Admitting: Family

## 2020-12-26 ENCOUNTER — Ambulatory Visit (INDEPENDENT_AMBULATORY_CARE_PROVIDER_SITE_OTHER): Payer: Medicare Other | Admitting: Family

## 2020-12-26 ENCOUNTER — Other Ambulatory Visit: Payer: Self-pay

## 2020-12-26 DIAGNOSIS — H65192 Other acute nonsuppurative otitis media, left ear: Secondary | ICD-10-CM | POA: Diagnosis not present

## 2020-12-26 MED ORDER — AMOXICILLIN 875 MG PO TABS: 875.0000 mg | ORAL_TABLET | Freq: Two times a day (BID) | ORAL | 0 refills | Status: DC

## 2020-12-26 NOTE — Progress Notes (Signed)
   Virtual Visit via telephone Note Due to COVID-19 pandemic this visit was conducted virtually. This visit type was conducted due to national recommendations for restrictions regarding the COVID-19 Pandemic (e.g. social distancing, sheltering in place) in an effort to limit this patient's exposure and mitigate transmission in our community. All issues noted in this document were discussed and addressed.  A physical exam was not performed with this format.  I connected with Robin Lindsey on 12/26/20 at 9:59  AM  by telephone and verified that I am speaking with the correct person using two identifiers. Robin Lindsey is currently located at home and no one is currently with her during visit. The provider, Evelina Dun, FNP is located in their office at time of visit.  I discussed the limitations, risks, security and privacy concerns of performing an evaluation and management service by telephone and the availability of in person appointments. I also discussed with the patient that there may be a patient responsible charge related to this service. The patient expressed understanding and agreed to proceed.   History and Present Illness:  Otalgia  There is pain in the left ear. This is a new problem. The current episode started 1 to 4 weeks ago. The problem occurs every few hours. The problem has been waxing and waning. There has been no fever. The pain is at a severity of 5/10 (dull ache). Associated symptoms include rhinorrhea. Pertinent negatives include no coughing, diarrhea, headaches, hearing loss, neck pain, sore throat or vomiting. She has tried acetaminophen for the symptoms. The treatment provided mild relief.      Review of Systems  HENT: Positive for ear pain and rhinorrhea. Negative for hearing loss and sore throat.   Respiratory: Negative for cough.   Gastrointestinal: Negative for diarrhea and vomiting.  Musculoskeletal: Negative for neck pain.  Neurological: Negative for  headaches.     Observations/Objective: No SOB or distress noted  Assessment and Plan: 1. Other non-recurrent acute nonsuppurative otitis media of left ear Rest Tylenol or Motrin Continue Claritin  Force fluids Call if symptoms worsen or do not improve  - amoxicillin (AMOXIL) 875 MG tablet; Take 1 tablet (875 mg total) by mouth 2 (two) times daily.  Dispense: 14 tablet; Refill: 0     I discussed the assessment and treatment plan with the patient. The patient was provided an opportunity to ask questions and all were answered. The patient agreed with the plan and demonstrated an understanding of the instructions.   The patient was advised to call back or seek an in-person evaluation if the symptoms worsen or if the condition fails to improve as anticipated.  The above assessment and management plan was discussed with the patient. The patient verbalized understanding of and has agreed to the management plan. Patient is aware to call the clinic if symptoms persist or worsen. Patient is aware when to return to the clinic for a follow-up visit. Patient educated on when it is appropriate to go to the emergency department.   Time call ended:  10:10 AM   I provided 11 minutes of non-face-to-face time during this encounter.    Evelina Dun, FNP

## 2021-01-04 DIAGNOSIS — H16223 Keratoconjunctivitis sicca, not specified as Sjogren's, bilateral: Secondary | ICD-10-CM | POA: Diagnosis not present

## 2021-01-04 DIAGNOSIS — H401123 Primary open-angle glaucoma, left eye, severe stage: Secondary | ICD-10-CM | POA: Diagnosis not present

## 2021-01-04 DIAGNOSIS — H401111 Primary open-angle glaucoma, right eye, mild stage: Secondary | ICD-10-CM | POA: Diagnosis not present

## 2021-01-04 DIAGNOSIS — Z961 Presence of intraocular lens: Secondary | ICD-10-CM | POA: Diagnosis not present

## 2021-01-04 DIAGNOSIS — E119 Type 2 diabetes mellitus without complications: Secondary | ICD-10-CM | POA: Diagnosis not present

## 2021-03-02 DIAGNOSIS — Z1231 Encounter for screening mammogram for malignant neoplasm of breast: Secondary | ICD-10-CM | POA: Diagnosis not present

## 2021-03-07 ENCOUNTER — Other Ambulatory Visit: Payer: Self-pay | Admitting: Family

## 2021-03-07 DIAGNOSIS — K219 Gastro-esophageal reflux disease without esophagitis: Secondary | ICD-10-CM

## 2021-05-03 ENCOUNTER — Encounter: Payer: Self-pay | Admitting: Nurse Practitioner

## 2021-05-10 DIAGNOSIS — Z961 Presence of intraocular lens: Secondary | ICD-10-CM | POA: Diagnosis not present

## 2021-05-10 DIAGNOSIS — H16223 Keratoconjunctivitis sicca, not specified as Sjogren's, bilateral: Secondary | ICD-10-CM | POA: Diagnosis not present

## 2021-05-10 DIAGNOSIS — H401123 Primary open-angle glaucoma, left eye, severe stage: Secondary | ICD-10-CM | POA: Diagnosis not present

## 2021-05-10 DIAGNOSIS — E119 Type 2 diabetes mellitus without complications: Secondary | ICD-10-CM | POA: Diagnosis not present

## 2021-05-10 DIAGNOSIS — H401111 Primary open-angle glaucoma, right eye, mild stage: Secondary | ICD-10-CM | POA: Diagnosis not present

## 2021-05-10 LAB — HM DIABETES EYE EXAM

## 2021-05-26 ENCOUNTER — Ambulatory Visit (INDEPENDENT_AMBULATORY_CARE_PROVIDER_SITE_OTHER): Payer: Medicare Other | Admitting: Nurse Practitioner

## 2021-05-26 ENCOUNTER — Encounter: Payer: Self-pay | Admitting: Nurse Practitioner

## 2021-05-26 DIAGNOSIS — U071 COVID-19: Secondary | ICD-10-CM

## 2021-05-26 MED ORDER — MOLNUPIRAVIR EUA 200MG CAPSULE: 4.0000 | ORAL_CAPSULE | Freq: Two times a day (BID) | ORAL | 0 refills | Status: AC

## 2021-05-26 NOTE — Progress Notes (Signed)
   Virtual Visit  Note Due to COVID-19 pandemic this visit was conducted virtually. This visit type was conducted due to national recommendations for restrictions regarding the COVID-19 Pandemic (e.g. social distancing, sheltering in place) in an effort to limit this patient's exposure and mitigate transmission in our community. All issues noted in this document were discussed and addressed.  A physical exam was not performed with this format.  I connected with Robin Lindsey on 05/26/21 at 1:18 by telephone and verified that I am speaking with the correct person using two identifiers. Robin Lindsey is currently located at home and her husband is currently with her during visit. The provider, Mary-Margaret Hassell Done, FNP is located in their office at time of visit.  I discussed the limitations, risks, security and privacy concerns of performing an evaluation and management service by telephone and the availability of in person appointments. I also discussed with the patient that there may be a patient responsible charge related to this service. The patient expressed understanding and agreed to proceed.   History and Present Illness:  Chief Complaint: covid positive  HPI Patient states she started getting sick on Tuesday. Got worse by Wednesday. Cough, sore thorat and congestion. She tested positive for covid yesterday.    Review of Systems  Constitutional:  Positive for chills and fever.  HENT:  Positive for congestion and sore throat. Negative for sinus pain.   Respiratory:  Positive for cough and sputum production. Negative for shortness of breath.   Musculoskeletal:  Positive for myalgias.  Neurological:  Negative for headaches.    Observations/Objective: Alert and oriented- answers all questions appropriately No distress Voice hoarse Dry cough  Assessment and Plan: Robin Lindsey in today with chief complaint of Covid Positive   1. Lab test positive for detection of COVID-19  virus Force fluids  Rest Quarantine Meds ordered this encounter  Medications   molnupiravir EUA 200 mg CAPS    Sig: Take 4 capsules (800 mg total) by mouth 2 (two) times daily for 5 days.    Dispense:  40 capsule    Refill:  0    Order Specific Question:   Supervising Provider    Answer:   Caryl Pina A [[id number]]        Follow Up Instructions: prn    I discussed the assessment and treatment plan with the patient. The patient was provided an opportunity to ask questions and all were answered. The patient agreed with the plan and demonstrated an understanding of the instructions.   The patient was advised to call back or seek an in-person evaluation if the symptoms worsen or if the condition fails to improve as anticipated.  The above assessment and management plan was discussed with the patient. The patient verbalized understanding of and has agreed to the management plan. Patient is aware to call the clinic if symptoms persist or worsen. Patient is aware when to return to the clinic for a follow-up visit. Patient educated on when it is appropriate to go to the emergency department.   Time call ended:  1:32  I provided 12 minutes of  non face-to-face time during this encounter.    Mary-Margaret Hassell Done, FNP

## 2021-05-31 ENCOUNTER — Telehealth: Payer: Self-pay | Admitting: Family

## 2021-05-31 DIAGNOSIS — U071 COVID-19: Secondary | ICD-10-CM

## 2021-05-31 MED ORDER — BENZONATATE 100 MG PO CAPS: 100.0000 mg | ORAL_CAPSULE | Freq: Three times a day (TID) | ORAL | 0 refills | Status: DC | PRN

## 2021-05-31 MED ORDER — DEXAMETHASONE 6 MG PO TABS: 6.0000 mg | ORAL_TABLET | Freq: Every day | ORAL | 0 refills | Status: DC

## 2021-05-31 NOTE — Telephone Encounter (Signed)
I don't feel she needs an antibiotic as that is not going to help with COVID. I did send some steroids and cough suppressant. Please explain she should not suppress cough all the time as this is her body's way of getting things up and out of her lungs. She should use it when she just needs a break or at night to sleep. Humidifier at night as symptoms are typically worse when lying down.

## 2021-05-31 NOTE — Telephone Encounter (Signed)
Pt will be out of molnupiravir EUA 200 mg CAPS today. She is now having mucus and bad cough. Pt wants to know if she can get an abx. [facility]. Pt aware MMM is not here and covering provider will address.

## 2021-05-31 NOTE — Telephone Encounter (Signed)
[employer] - covering MMM today  MMM done OV 6/17 for this pt - asking for antibiotic. Please address

## 2021-05-31 NOTE — Telephone Encounter (Signed)
Patient aware and verbalizes understanding. 

## 2021-06-05 ENCOUNTER — Ambulatory Visit: Payer: Medicare Other | Admitting: Nurse Practitioner

## 2021-06-05 ENCOUNTER — Other Ambulatory Visit (INDEPENDENT_AMBULATORY_CARE_PROVIDER_SITE_OTHER): Payer: Medicare Other

## 2021-06-05 VITALS — BP 132/72 | HR 72 | Ht 59.0 in | Wt 205.8 lb

## 2021-06-05 DIAGNOSIS — R197 Diarrhea, unspecified: Secondary | ICD-10-CM

## 2021-06-05 DIAGNOSIS — K219 Gastro-esophageal reflux disease without esophagitis: Secondary | ICD-10-CM | POA: Diagnosis not present

## 2021-06-05 LAB — COMPREHENSIVE METABOLIC PANEL
ALT: 24 U/L (ref 0–35)
AST: 18 U/L (ref 0–37)
Albumin: 4 g/dL (ref 3.5–5.2)
Alkaline Phosphatase: 66 U/L (ref 39–117)
BUN: 17 mg/dL (ref 6–23)
CO2: 29 mEq/L (ref 19–32)
Calcium: 9.2 mg/dL (ref 8.4–10.5)
Chloride: 100 mEq/L (ref 96–112)
Creatinine, Ser: 1.03 mg/dL (ref 0.40–1.20)
GFR: 52.55 mL/min — ABNORMAL LOW (ref >60.00)
Glucose, Bld: 115 mg/dL — ABNORMAL HIGH (ref 70–99)
Potassium: 4 mEq/L (ref 3.5–5.1)
Sodium: 136 mEq/L (ref 135–145)
Total Bilirubin: 0.5 mg/dL (ref 0.2–1.2)
Total Protein: 6.9 g/dL (ref 6.0–8.3)

## 2021-06-05 LAB — CBC WITH DIFFERENTIAL/PLATELET
Basophils Absolute: 0.1 10*3/uL (ref 0.0–0.1)
Basophils Relative: 0.8 % (ref 0.0–3.0)
Eosinophils Absolute: 0.2 10*3/uL (ref 0.0–0.7)
Eosinophils Relative: 2.3 % (ref 0.0–5.0)
HCT: 40.3 % (ref 36.0–46.0)
Hemoglobin: 13.4 g/dL (ref 12.0–15.0)
Lymphocytes Relative: 30.5 % (ref 12.0–46.0)
Lymphs Abs: 2.9 10*3/uL (ref 0.7–4.0)
MCHC: 33.2 g/dL (ref 30.0–36.0)
MCV: 92.3 fl (ref 78.0–100.0)
Monocytes Absolute: 0.7 10*3/uL (ref 0.1–1.0)
Monocytes Relative: 7.8 % (ref 3.0–12.0)
Neutro Abs: 5.5 10*3/uL (ref 1.4–7.7)
Neutrophils Relative %: 58.6 % (ref 43.0–77.0)
Platelets: 313 10*3/uL (ref 150.0–400.0)
RBC: 4.37 Mil/uL (ref 3.87–5.11)
RDW: 13.9 % (ref 11.5–15.5)
WBC: 9.4 10*3/uL (ref 4.0–10.5)

## 2021-06-05 NOTE — Progress Notes (Signed)
06/05/2021 Robin Lindsey [id number] 1946-10-04   CHIEF COMPLAINT: Diarrhea, schedule colonoscopy  HISTORY OF PRESENT ILLNESS:  Robin Lindsey is a 75 year old female with a past medical history of arthritis, hypertension, nonobstructive coronary artery disease per coronary CT angiography 2020, DM II diet controlled, GERD and colon polyps.  She was treated for Covid  19 (cough/sore throat) 05/26/2021 treated with Molnupiravir x 5 days with resolution of symptoms.  She presents to our office today self-referred for further evaluation regarding diarrhea and to schedule a colonoscopy.  She complains of having soft to loose urgent bowel movements for the past 6 to 12 months which have progressively worsened over the past month or two.  She describes passing 1-3 urgent soft to loose or watery bowel movements daily.  No bloody stools.  No associated abdominal pain.  No antibiotics within the past year that she can recall.  She avoids dairy products.  She took a fiber supplement which worsened her symptoms.  She has tried 2-3 different probiotics without improvement.  Foods with preservatives or meat tenderizer worsen her diarrhea.  This past Sunday, she was at church and ran to the bathroom barely making it in time.  She takes Imodium once weekly as needed which resulted no bowel movement for 2 days.  No fevers.  Five to six pound weight loss over the past year.  No other complaints at this time. Her most recent colonoscopy by Dr. Carlean Purl was on 05/02/2016 which identified 2 tubular adenomatous polyps which were from the rectum and distal transverse colon. Repeat colonoscopy in 5 years was recommended.    Note, during her colonoscopy 05/02/2016 she stated as soon as the sedation (MAC/Propofol) went into her IV she felt "something hit my throat" and when she awakened from the procedure she recalls the endoscopy RN reporting she coughed throughout the procedure".  There was no documentation of an adverse  respiratory event/excessive coughing on the colonoscopy procedure report.  History of a tubulovillous adenomatous polyp in 2003.  Maternal aunt with colon cancer, mother with history of colon polyps.  She is no longer taking Meloxicam for neck arthritis, she has tried dry needling therapy which is reduced her neck pain.  She has a history of GERD for which she takes Omeprazole 20 mg once daily, sometimes twice daily if she eats spaghetti or spicy foods.  If she skips 1 dose of Omeprazole she develops heartburn.  She underwent an EGD 10/26/2002 which showed a hiatal hernia.   Past GI procedures:  Colonoscopy by Dr. Carlean Purl 05/02/2016: - Two 4 to 5 mm polyps in the rectum and in the distal transverse colon, removed with a cold snare. Resected and retrieved. - The examination was otherwise normal on direct and retroflexion views. - 5 year colonoscopy recall - TUBULAR ADENOMA(X2). - HIGH GRADE DYSPLASIA IS NOT IDENTIFIED.  Colonoscopy 12/09/2012: 1.  3 sessile polyps ranging between 3 to 5 mm in size were found in the cecum and in the ascending colon, polypectomy was performed with a cold snare 2.  Small internal hemorrhoids 3.  The colon mucosa was otherwise normal, in patient with prior TV adenoma in 2003 and serrated adenoma 2007 TUBULAR ADENOMAS (X3). - NEGATIVE FOR HIGH GRADE DYSPLASIA  EGD 10/26/2002: Hiatal hernia, no strictures  Colonoscopy 09/04/2006:  - 8 mm sessile polyp at the hepatic flexure removed - 10 mm sessile polyp removed from the transverse colon Hepatic flexure colon polyp Hyperplastic polyp No adenomatous mucosa identified Transverse  colon polyp Serrated adenoma No high grade dysplasia identified  Colonoscopy 07/13/2002: Tubulovillous adenoma was removed from the colon  Past cardiac studies:  Coronary CT 05/20/2021: 1. Calcium score 49 which is 48 th percentile for age and sex  2.  CAD RADS one minimal non obstructive CAD 3.  Normal aortic root 3.0 cm  ECHO  06/03/2019: 1. The left ventricle has hyperdynamic systolic function, with an  ejection fraction of >65%. The cavity size was normal. There is mildly  increased left ventricular wall thickness. Left ventricular diastolic  Doppler parameters are consistent with  impaired relaxation.   2. The right ventricle has normal systolic function. The cavity was  normal. There is no increase in right ventricular wall thickness.   3. Left atrial size was mildly dilated.   4. The aortic valve is tricuspid. Mild thickening of the aortic valve.  Mild calcification of the aortic valve. No stenosis of the aortic valve.  Mild aortic annular calcification noted.   5. The mitral valve is abnormal. Mild thickening of the mitral valve  leaflet. Mild calcification of the mitral valve leaflet. There is mild  mitral annular calcification present. No evidence of mitral valve  stenosis.   6. The aortic root is normal in size and structure.   7. Pulmonary hypertension is indeterminant, inadequate TR jet.    Past Medical History:  Diagnosis Date   Arthritis    Diabetes mellitus without complication (HCC)    GERD (gastroesophageal reflux disease)    Glaucoma    worse in left eye than right eye   Heel pain    right heel pain   Hypertension    Personal history of colonic polyps 12/09/2012   Plantar fasciitis    right foot   Past Surgical History:  Procedure Laterality Date   BREAST LUMPECTOMY  10 + yrs.   left breast/benign   CATARACT EXTRACTION W/PHACO  02/14/2012   Procedure: CATARACT EXTRACTION PHACO AND INTRAOCULAR LENS PLACEMENT ([place]);  Surgeon: Tonny Branch, MD;  Location: [facility];  Service: Ophthalmology;  Laterality: Left;  CDE 13.73   CATARACT EXTRACTION W/PHACO  03/03/2012   Procedure: CATARACT EXTRACTION PHACO AND INTRAOCULAR LENS PLACEMENT ([facility]);  Surgeon: Tonny Branch, MD;  Location: [facility];  Service: Ophthalmology;  Laterality: Right;  CDE: 16.49   COLONOSCOPY     percutaneous micrfasciotomy with  topaz coblation wand  12/2015   Social History: She is married.  Retired.  She has 1 son and 2 daughters.  Quit smoking 40 years ago. No alcohol use. No drug use.   Family History: Maternal aunt with history of colon cancer diagnosed in her 73's. Maternal great uncle with colon cancer.   Mother with history of colon polyps, diabetes and pulmonary fibrosis.  Father with history of heart disease, CVA, dementia and asbestos related lung disease.  Brother with history of Mnire's disease, thyroid disease and diabetes.   Allergies  Allergen Reactions   Metformin And Related Nausea And Vomiting   Aspirin Nausea Only and Other (See Comments)    pain   Nsaids     Stomach pain      Outpatient Encounter Medications as of 06/05/2021  Medication Sig   albuterol (VENTOLIN HFA) 108 (90 Base) MCG/ACT inhaler Inhale 2 puffs into the lungs every 6 (six) hours as needed for wheezing or shortness of breath.   amoxicillin (AMOXIL) 875 MG tablet Take 1 tablet (875 mg total) by mouth 2 (two) times daily.   BAYER MICROLET LANCETS lancets Use  to check BG daily   benzonatate (TESSALON PERLES) 100 MG capsule Take 1 capsule (100 mg total) by mouth 3 (three) times daily as needed for cough.   cyclobenzaprine (FLEXERIL) 10 MG tablet Take 1 tablet (10 mg total) by mouth 3 (three) times daily as needed for muscle spasms.   dexamethasone (DECADRON) 6 MG tablet Take 1 tablet (6 mg total) by mouth daily for 5 days.   fluticasone (FLONASE) 50 MCG/ACT nasal spray Place 2 sprays into both nostrils daily.   glucose blood (BAYER CONTOUR NEXT TEST) test strip Use to check BG daily   loratadine (CLARITIN) 10 MG tablet Take 10 mg by mouth daily.   LUMIGAN 0.01 % SOLN Place into both eyes daily.    meloxicam (MOBIC) 7.5 MG tablet Take 1 tablet (7.5 mg total) by mouth in the morning and at bedtime.   omeprazole (PRILOSEC) 20 MG capsule TAKE ONE CAPSULE BY MOUTH TWICE A DAY BEFORE MEALS (NEEDS TO BE SEEN BEFORE NEXT REFILL)   OVER  THE COUNTER MEDICATION Patient takes 1/4 teaspoon of hemp oil by mouth daily   valsartan-hydrochlorothiazide (DIOVAN-HCT) 160-25 MG tablet Take 1 tablet by mouth daily. (NEEDS TO BE SEEN BEFORE NEXT REFILL)   No facility-administered encounter medications on file as of 06/05/2021.    REVIEW OF SYSTEMS:  Gen: Denies fever, sweats or chills. No weight loss.  CV: Denies chest pain, palpitations or edema. Resp: Denies cough, shortness of breath of hemoptysis.  GI: See HPI. GU : + Urinary leakage.  No dysuria or hematuria. MS: + Arthritis/neck pain. Derm: Denies rash, itchiness, skin lesions or unhealing ulcers. Psych: Denies depression, anxiety or memory loss. Heme: Denies bruising, bleeding. Neuro:  Denies headaches, dizziness or paresthesias. Endo:  + Diabetes diet-controlled.  PHYSICAL EXAM: BP 132/72   Pulse 72   Ht _0  (1.499 m)   Wt 205 lb 12.8 oz (93.4 kg)   BMI 41.57 kg/m   Wt Readings from Last 3 Encounters:  06/05/21 205 lb 12.8 oz (93.4 kg)  06/24/20 206 lb 3.2 oz (93.5 kg)  06/08/20 211 lb 6.4 oz (95.9 kg)    General: 75 year old female in no acute distress. Head: Normocephalic and atraumatic. Eyes:  Sclerae non-icteric, conjunctive pink. Ears: Normal auditory acuity. Mouth: Dentition intact. No ulcers or lesions.  Neck: Supple, no lymphadenopathy or thyromegaly.  Lungs: Clear bilaterally to auscultation without wheezes, crackles or rhonchi. Heart: Regular rate and rhythm. No murmur, rub or gallop appreciated.  Abdomen: Soft, nontender, non distended. No masses. No hepatosplenomegaly. Normoactive bowel sounds x 4 quadrants.  Rectal: Deferred. Musculoskeletal: Symmetrical with no gross deformities. Skin: Warm and dry. No rash or lesions on visible extremities. Extremities: Trace bilateral LE edema.  Neurological: Alert oriented x 4, no focal deficits.  Psychological:  Alert and cooperative. Normal mood and affect.  ASSESSMENT AND PLAN:  67.  75 year old  female with a history of tubular adenomatous, sessile serrated and hyperplastic polyps ( 2007, 2013 and 2017) including a single tubulovillous colon polyp in 2003.  Her most recent colonoscopy was 04/2016 and 2 tubular adenomatous polyps were removed from the rectum and distal transverse colon. Family history of colon cancer (maternal aunt)) and family history of colon polyps (mother). -Colonoscopy benefits and risks discussed including risk with sedation, risk of bleeding, perforation and infection.  2. GERD, patient is dependent on PPI -Continue Omeprazole 20 mg daily -EGD benefits and risks discussed including risk with sedation, risk of bleeding, perforation and infection   3.  Chronic  diarrhea, worsening over the past few months with increased urgency -GI pathogen panel -CBC, CMP, TTG and IgA level -Colonoscopy to rule out microscopic colitis/IBD as ordered above.  EGD above to include biopsies to rule out celiac disease. -Imodium one half tab p.o. daily, hold if no bowel movement in 24 hours -May consider trial of cholestyramine if needed  Further recommendations to be determined after the above evaluation completed           CC:  Sharion Balloon, FNP

## 2021-06-05 NOTE — Patient Instructions (Addendum)
If you are age 77 or older, your body mass index should be between 23-30. Your Body mass index is 41.57 kg/m. If this is out of the aforementioned range listed, please consider follow up with your Primary Care Provider.  The [place] [facility] providers would like to encourage you to use [facility] to communicate with providers for non-urgent requests or questions.  Due to long hold times on the telephone, sending your provider a message by [facility] may be faster and more efficient way to get a response. Please allow 48 business hours for a response.  Please remember that this is for non-urgent requests/questions.  PROCEDURES: You have been scheduled for an EGD and Colonoscopy. Please follow the written instructions given to you at your visit today. If you use inhalers (even only as needed), please bring them with you on the day of your procedure.  LABS:  Lab work has been ordered for you today. Our lab is located in the basement. Press "B" on the elevator. The lab is located at the first door on the left as you exit the elevator.  HEALTHCARE LAWS AND MY CHART RESULTS: Due to recent changes in healthcare laws, you may see the results of your imaging and laboratory studies on MyChart before your provider has had a chance to review them.   We understand that in some cases there may be results that are confusing or concerning to you. Not all laboratory results come back in the same time frame and the provider may be waiting for multiple results in order to interpret others.  Please give [place] 48 hours in order for your provider to thoroughly review all the results before contacting the office for clarification of your results.   RECOMMENDATIONS: Take 1/2 tablet of Imodium once a day, do not take if no bowel movement in 24 hours. May eat green bananas or dried apple chips which contain pectin (constipating type fiber)  It was great seeing you today! Thank you for entrusting me with your care and choosing  [facility].  Noralyn Pick, CRNP

## 2021-06-06 LAB — IGA: Immunoglobulin A: 241 mg/dL (ref 70–320)

## 2021-06-06 LAB — TISSUE TRANSGLUTAMINASE ABS,IGG,IGA
(tTG) Ab, IgA: <1 U/mL
(tTG) Ab, IgG: <1 U/mL

## 2021-06-09 ENCOUNTER — Telehealth: Payer: Self-pay | Admitting: Family

## 2021-06-09 DIAGNOSIS — K219 Gastro-esophageal reflux disease without esophagitis: Secondary | ICD-10-CM

## 2021-06-09 MED ORDER — OMEPRAZOLE 20 MG PO CPDR
DELAYED_RELEASE_CAPSULE | ORAL | 0 refills | Status: DC
Start: 2021-06-09 — End: 2021-07-13

## 2021-06-09 MED ORDER — VALSARTAN-HYDROCHLOROTHIAZIDE 160-25 MG PO TABS: 1.0000 | ORAL_TABLET | Freq: Every day | ORAL | 0 refills | Status: DC

## 2021-06-09 NOTE — Telephone Encounter (Signed)
  Prescription Request  06/09/2021  What is the name of the medication or equipment? B/p meds & acid reflux meds  Have you contacted your pharmacy to request a refill? (if applicable) no  Which pharmacy would you like this sent to? [facility]   Patient notified that their request is being sent to the clinical staff for review and that they should receive a response within 2 business days.    [name]' pt.

## 2021-07-11 ENCOUNTER — Other Ambulatory Visit: Payer: Self-pay | Admitting: Family

## 2021-07-13 ENCOUNTER — Encounter: Payer: Self-pay | Admitting: Family

## 2021-07-13 ENCOUNTER — Ambulatory Visit (INDEPENDENT_AMBULATORY_CARE_PROVIDER_SITE_OTHER): Payer: Medicare Other | Admitting: Family

## 2021-07-13 ENCOUNTER — Ambulatory Visit (INDEPENDENT_AMBULATORY_CARE_PROVIDER_SITE_OTHER): Payer: Medicare Other

## 2021-07-13 ENCOUNTER — Other Ambulatory Visit: Payer: Self-pay

## 2021-07-13 VITALS — BP 121/72 | HR 79 | Temp 97.3°F | Ht 59.0 in | Wt 207.4 lb

## 2021-07-13 DIAGNOSIS — Z780 Asymptomatic menopausal state: Secondary | ICD-10-CM

## 2021-07-13 DIAGNOSIS — I152 Hypertension secondary to endocrine disorders: Secondary | ICD-10-CM | POA: Diagnosis not present

## 2021-07-13 DIAGNOSIS — E559 Vitamin D deficiency, unspecified: Secondary | ICD-10-CM

## 2021-07-13 DIAGNOSIS — Z8616 Personal history of COVID-19: Secondary | ICD-10-CM

## 2021-07-13 DIAGNOSIS — Z23 Encounter for immunization: Secondary | ICD-10-CM

## 2021-07-13 DIAGNOSIS — R0602 Shortness of breath: Secondary | ICD-10-CM

## 2021-07-13 DIAGNOSIS — F411 Generalized anxiety disorder: Secondary | ICD-10-CM

## 2021-07-13 DIAGNOSIS — E1169 Type 2 diabetes mellitus with other specified complication: Secondary | ICD-10-CM | POA: Diagnosis not present

## 2021-07-13 DIAGNOSIS — K219 Gastro-esophageal reflux disease without esophagitis: Secondary | ICD-10-CM

## 2021-07-13 DIAGNOSIS — E1159 Type 2 diabetes mellitus with other circulatory complications: Secondary | ICD-10-CM

## 2021-07-13 DIAGNOSIS — Z0001 Encounter for general adult medical examination with abnormal findings: Secondary | ICD-10-CM | POA: Diagnosis not present

## 2021-07-13 DIAGNOSIS — Z0000 Encounter for general adult medical examination without abnormal findings: Secondary | ICD-10-CM

## 2021-07-13 MED ORDER — VALSARTAN-HYDROCHLOROTHIAZIDE 160-25 MG PO TABS: ORAL_TABLET | ORAL | 2 refills | Status: DC

## 2021-07-13 MED ORDER — OMEPRAZOLE 20 MG PO CPDR: DELAYED_RELEASE_CAPSULE | ORAL | 1 refills | Status: DC

## 2021-07-13 NOTE — Patient Instructions (Signed)

## 2021-07-13 NOTE — Addendum Note (Signed)
Addended by: Brynda Peon F on: 07/13/2021 04:17 PM   Modules accepted: Orders

## 2021-07-13 NOTE — Progress Notes (Signed)
 Subjective:    Patient ID: Robin Lindsey, female    DOB: 10/08/1946, 75 y.o.   MRN: [id number]  Chief Complaint  Patient presents with   Annual Exam   Pt presents to the office today for annual physical. She had COVID on 05/26/21 and states she still has SOB at times with this.   She reports she is having lower abdominal pain, diarrhea that started over the last year, but has worsen. She has an appointment for colonoscopy and EGD on 07/21/21.  Diabetes She presents for her follow-up diabetic visit. She has type 2 diabetes mellitus. Hypoglycemia symptoms include nervousness/anxiousness. Associated symptoms include foot paresthesias. Pertinent negatives for diabetes include no blurred vision. Symptoms are stable. Diabetic complications include peripheral neuropathy. Pertinent negatives for diabetic complications include no heart disease. Risk factors for coronary artery disease include dyslipidemia, diabetes mellitus, hypertension and sedentary lifestyle. She is following a generally unhealthy diet. (Does not check at home ) An ACE inhibitor/angiotensin II receptor blocker is being taken. Eye exam is current.  Hypertension This is a chronic problem. The current episode started more than 1 year ago. The problem has been resolved since onset. The problem is controlled. Associated symptoms include peripheral edema ("some times at night") and shortness of breath. Pertinent negatives include no blurred vision or malaise/fatigue. Risk factors for coronary artery disease include dyslipidemia, obesity and sedentary lifestyle. The current treatment provides moderate improvement.  Gastroesophageal Reflux She complains of belching and heartburn. This is a chronic problem. The current episode started more than 1 year ago. The problem occurs occasionally. The problem has been waxing and waning. Risk factors include obesity. She has tried a PPI for the symptoms. The treatment provided moderate relief.   Anxiety Presents for follow-up visit. Symptoms include depressed mood, excessive worry, irritability, nervous/anxious behavior and shortness of breath. Symptoms occur occasionally. The severity of symptoms is moderate.       Review of Systems  Constitutional:  Positive for irritability. Negative for malaise/fatigue.  Eyes:  Negative for blurred vision.  Respiratory:  Positive for shortness of breath.   Gastrointestinal:  Positive for heartburn.  Psychiatric/Behavioral:  The patient is nervous/anxious.   All other systems reviewed and are negative.     Objective:   Physical Exam Vitals reviewed.  Constitutional:      General: She is not in acute distress.    Appearance: She is well-developed. She is obese.  HENT:     Head: Normocephalic and atraumatic.     Right Ear: Tympanic membrane normal.     Left Ear: Tympanic membrane normal.  Eyes:     Pupils: Pupils are equal, round, and reactive to light.  Neck:     Thyroid: No thyromegaly.  Cardiovascular:     Rate and Rhythm: Normal rate and regular rhythm.     Heart sounds: Normal heart sounds. No murmur heard. Pulmonary:     Effort: Pulmonary effort is normal. No respiratory distress.     Breath sounds: Normal breath sounds. No wheezing.  Abdominal:     General: Bowel sounds are normal. There is no distension.     Palpations: Abdomen is soft.     Tenderness: There is no abdominal tenderness.  Musculoskeletal:        General: No tenderness. Normal range of motion.     Cervical back: Normal range of motion and neck supple.     Right lower leg: Edema (trace) present.     Left lower leg: Edema (trace) present.    Skin:    General: Skin is warm and dry.  Neurological:     Mental Status: She is alert and oriented to person, place, and time.     Cranial Nerves: No cranial nerve deficit.     Deep Tendon Reflexes: Reflexes are normal and symmetric.  Psychiatric:        Behavior: Behavior normal.        Thought Content: Thought  content normal.        Judgment: Judgment normal.         BP 121/72   Pulse 79   Temp (!) 97.3 F (36.3 C) (Temporal)   Ht 4' 11" (1.499 m)   Wt 207 lb 6.4 oz (94.1 kg)   SpO2 97%   BMI 41.89 kg/m   Assessment & Plan:  Robin Lindsey comes in today with chief complaint of Annual Exam (Still have having SOB after COVID )   Diagnosis and orders addressed:  1. Gastroesophageal reflux disease without esophagitis - omeprazole (PRILOSEC) 20 MG capsule; TAKE ONE CAPSULE BY MOUTH TWICE A DAY BEFORE MEALS (NEEDS TO BE SEEN BEFORE NEXT REFILL)  Dispense: 180 capsule; Refill: 1 - CMP14+EGFR - CBC with Differential/Platelet  2. Type 2 diabetes mellitus with other specified complication, without long-term current use of insulin (HCC) - CMP14+EGFR - CBC with Differential/Platelet  3. Hypertension associated with diabetes ([place]) - CMP14+EGFR - CBC with Differential/Platelet  4. Vitamin D deficiency - CMP14+EGFR - CBC with Differential/Platelet  5. Morbid obesity ([place]) - CMP14+EGFR - CBC with Differential/Platelet  6. GAD (generalized anxiety disorder) - CMP14+EGFR - CBC with Differential/Platelet  7. History of COVID-19 - CMP14+EGFR - CBC with Differential/Platelet - DG Chest 2 View; Future  8. Annual physical exam - CMP14+EGFR - CBC with Differential/Platelet - Lipid panel - TSH  9. Post-menopause - DG [facility] DEXA - CMP14+EGFR - CBC with Differential/Platelet  10. SOB (shortness of breath) on exertion - CMP14+EGFR - CBC with Differential/Platelet - DG Chest 2 View; Future   Labs pending Health Maintenance reviewed Diet and exercise encouraged  Follow up plan: 6 months    Evelina Dun, FNP

## 2021-07-14 DIAGNOSIS — M810 Age-related osteoporosis without current pathological fracture: Secondary | ICD-10-CM | POA: Diagnosis not present

## 2021-07-14 DIAGNOSIS — M85831 Other specified disorders of bone density and structure, right forearm: Secondary | ICD-10-CM | POA: Diagnosis not present

## 2021-07-14 DIAGNOSIS — Z780 Asymptomatic menopausal state: Secondary | ICD-10-CM | POA: Diagnosis not present

## 2021-07-14 LAB — CMP14+EGFR
ALT: 15 IU/L (ref 0–32)
AST: 16 IU/L (ref 0–40)
Albumin/Globulin Ratio: 2.1 (ref 1.2–2.2)
Albumin: 3.9 g/dL (ref 3.7–4.7)
Alkaline Phosphatase: 80 IU/L (ref 44–121)
BUN/Creatinine Ratio: 13 (ref 12–28)
BUN: 13 mg/dL (ref 8–27)
Bilirubin Total: 0.2 mg/dL (ref 0.0–1.2)
CO2: 28 mmol/L (ref 20–29)
Calcium: 8.9 mg/dL (ref 8.7–10.3)
Chloride: 99 mmol/L (ref 96–106)
Creatinine, Ser: 0.98 mg/dL (ref 0.57–1.00)
Globulin, Total: 1.9 g/dL (ref 1.5–4.5)
Glucose: 143 mg/dL — ABNORMAL HIGH (ref 65–99)
Potassium: 4 mmol/L (ref 3.5–5.2)
Sodium: 138 mmol/L (ref 134–144)
Total Protein: 5.8 g/dL — ABNORMAL LOW (ref 6.0–8.5)
eGFR: 59 mL/min/{1.73_m2} — ABNORMAL LOW (ref >59)

## 2021-07-14 LAB — LIPID PANEL
Chol/HDL Ratio: 3 ratio (ref 0.0–4.4)
Cholesterol, Total: 166 mg/dL (ref 100–199)
HDL: 56 mg/dL (ref >39)
LDL Chol Calc (NIH): 94 mg/dL (ref 0–99)
Triglycerides: 88 mg/dL (ref 0–149)
VLDL Cholesterol Cal: 16 mg/dL (ref 5–40)

## 2021-07-14 LAB — CBC WITH DIFFERENTIAL/PLATELET
Basophils Absolute: 0.1 10*3/uL (ref 0.0–0.2)
Basos: 1 %
EOS (ABSOLUTE): 0.4 10*3/uL (ref 0.0–0.4)
Eos: 4 %
Hematocrit: 36.6 % (ref 34.0–46.6)
Hemoglobin: 12.5 g/dL (ref 11.1–15.9)
Immature Grans (Abs): 0 10*3/uL (ref 0.0–0.1)
Immature Granulocytes: 0 %
Lymphocytes Absolute: 3.4 10*3/uL — ABNORMAL HIGH (ref 0.7–3.1)
Lymphs: 32 %
MCH: 32.1 pg (ref 26.6–33.0)
MCHC: 34.2 g/dL (ref 31.5–35.7)
MCV: 94 fL (ref 79–97)
Monocytes Absolute: 0.8 10*3/uL (ref 0.1–0.9)
Monocytes: 8 %
Neutrophils Absolute: 5.7 10*3/uL (ref 1.4–7.0)
Neutrophils: 55 %
Platelets: 250 10*3/uL (ref 150–450)
RBC: 3.9 x10E6/uL (ref 3.77–5.28)
RDW: 13.1 % (ref 11.7–15.4)
WBC: 10.5 10*3/uL (ref 3.4–10.8)

## 2021-07-14 LAB — TSH: TSH: 2.4 u[IU]/mL (ref 0.450–4.500)

## 2021-07-17 ENCOUNTER — Other Ambulatory Visit: Payer: Self-pay | Admitting: Family

## 2021-07-17 MED ORDER — ALENDRONATE SODIUM 70 MG PO TABS: 70.0000 mg | ORAL_TABLET | ORAL | 11 refills | Status: DC

## 2021-07-18 LAB — HGB A1C W/O EAG: Hgb A1c MFr Bld: 6.8 % — ABNORMAL HIGH (ref 4.8–5.6)

## 2021-07-18 LAB — SPECIMEN STATUS REPORT

## 2021-07-21 ENCOUNTER — Ambulatory Visit (AMBULATORY_SURGERY_CENTER): Payer: Medicare Other | Admitting: Internal Medicine

## 2021-07-21 ENCOUNTER — Encounter: Payer: Self-pay | Admitting: Internal Medicine

## 2021-07-21 ENCOUNTER — Other Ambulatory Visit: Payer: Self-pay

## 2021-07-21 VITALS — BP 128/62 | HR 76 | Temp 96.8°F | Resp 19 | Ht 59.0 in | Wt 205.0 lb

## 2021-07-21 DIAGNOSIS — R197 Diarrhea, unspecified: Secondary | ICD-10-CM | POA: Diagnosis not present

## 2021-07-21 DIAGNOSIS — K317 Polyp of stomach and duodenum: Secondary | ICD-10-CM | POA: Diagnosis not present

## 2021-07-21 DIAGNOSIS — K5730 Diverticulosis of large intestine without perforation or abscess without bleeding: Secondary | ICD-10-CM

## 2021-07-21 DIAGNOSIS — K3189 Other diseases of stomach and duodenum: Secondary | ICD-10-CM | POA: Diagnosis not present

## 2021-07-21 DIAGNOSIS — K635 Polyp of colon: Secondary | ICD-10-CM | POA: Diagnosis not present

## 2021-07-21 DIAGNOSIS — K222 Esophageal obstruction: Secondary | ICD-10-CM | POA: Diagnosis not present

## 2021-07-21 DIAGNOSIS — K219 Gastro-esophageal reflux disease without esophagitis: Secondary | ICD-10-CM

## 2021-07-21 DIAGNOSIS — K319 Disease of stomach and duodenum, unspecified: Secondary | ICD-10-CM | POA: Diagnosis not present

## 2021-07-21 DIAGNOSIS — K449 Diaphragmatic hernia without obstruction or gangrene: Secondary | ICD-10-CM | POA: Diagnosis not present

## 2021-07-21 MED ORDER — SODIUM CHLORIDE 0.9 % IV SOLN: 500.0000 mL | INTRAVENOUS | Status: DC

## 2021-07-21 NOTE — Progress Notes (Signed)
[facility] History and Physical   Primary Care Physician:  Sharion Balloon, FNP   Reason for Procedure:   GERD, hx polyps, diarrhea  Plan:    EGD and colonoscopy     HPI: Robin Lindsey is a 75 y.o. female w/ problems as outlined below - seen in office 06/05/2021 - no sig changes.    Robin Lindsey is a 75 year old female with a past medical history of arthritis, hypertension, nonobstructive coronary artery disease per coronary CT angiography 2020, DM II diet controlled, GERD and colon polyps.  She was treated for Covid  19 (cough/sore throat) 05/26/2021 treated with Molnupiravir x 5 days with resolution of symptoms.   She presents to our office today self-referred for further evaluation regarding diarrhea and to schedule a colonoscopy.  She complains of having soft to loose urgent bowel movements for the past 6 to 12 months which have progressively worsened over the past month or two.  She describes passing 1-3 urgent soft to loose or watery bowel movements daily.  No bloody stools.  No associated abdominal pain.  No antibiotics within the past year that she can recall.  She avoids dairy products.  She took a fiber supplement which worsened her symptoms.  She has tried 2-3 different probiotics without improvement.  Foods with preservatives or meat tenderizer worsen her diarrhea.  This past Sunday, she was at church and ran to the bathroom barely making it in time.  She takes Imodium once weekly as needed which resulted no bowel movement for 2 days.  No fevers.  Five to six pound weight loss over the past year.  No other complaints at this time. Her most recent colonoscopy by Dr. Carlean Purl was on 05/02/2016 which identified 2 tubular adenomatous polyps which were from the rectum and distal transverse colon. Repeat colonoscopy in 5 years was recommended.     Note, during her colonoscopy 05/02/2016 she stated as soon as the sedation (MAC/Propofol) went into her IV she felt "something hit  my throat" and when she awakened from the procedure she recalls the endoscopy RN reporting she coughed throughout the procedure".  There was no documentation of an adverse respiratory event/excessive coughing on the colonoscopy procedure report.   History of a tubulovillous adenomatous polyp in 2003.  Maternal aunt with colon cancer, mother with history of colon polyps.  She is no longer taking Meloxicam for neck arthritis, she has tried dry needling therapy which is reduced her neck pain.  She has a history of GERD for which she takes Omeprazole 20 mg once daily, sometimes twice daily if she eats spaghetti or spicy foods.  If she skips 1 dose of Omeprazole she develops heartburn.  She underwent an EGD 10/26/2002 which showed a hiatal hernia.   Past GI procedures:   Colonoscopy by Dr. Carlean Purl 05/02/2016: - Two 4 to 5 mm polyps in the rectum and in the distal transverse colon, removed with a cold snare. Resected and retrieved. - The examination was otherwise normal on direct and retroflexion views. - 5 year colonoscopy recall - TUBULAR ADENOMA(X2). - HIGH GRADE DYSPLASIA IS NOT IDENTIFIED.   Colonoscopy 12/09/2012: 1.  3 sessile polyps ranging between 3 to 5 mm in size were found in the cecum and in the ascending colon, polypectomy was performed with a cold snare 2.  Small internal hemorrhoids 3.  The colon mucosa was otherwise normal, in patient with prior TV adenoma in 2003 and serrated adenoma 2007 TUBULAR ADENOMAS (X3). -  NEGATIVE FOR HIGH GRADE DYSPLASIA   EGD 10/26/2002: Hiatal hernia, no strictures   Colonoscopy 09/04/2006:  - 8 mm sessile polyp at the hepatic flexure removed - 10 mm sessile polyp removed from the transverse colon Hepatic flexure colon polyp Hyperplastic polyp No adenomatous mucosa identified Transverse colon polyp Serrated adenoma No high grade dysplasia identified   Colonoscopy 07/13/2002: Tubulovillous adenoma was removed from the colon   Past cardiac  studies:  Coronary CT 05/20/2021: 1. Calcium score 49 which is 50 th percentile for age and sex  2.  CAD RADS one minimal non obstructive CAD 3.  Normal aortic root 3.0 cm   ECHO 06/03/2019: 1. The left ventricle has hyperdynamic systolic function, with an  ejection fraction of >65%. The cavity size was normal. There is mildly  increased left ventricular wall thickness. Left ventricular diastolic  Doppler parameters are consistent with  impaired relaxation.   2. The right ventricle has normal systolic function. The cavity was  normal. There is no increase in right ventricular wall thickness.   3. Left atrial size was mildly dilated.   4. The aortic valve is tricuspid. Mild thickening of the aortic valve.  Mild calcification of the aortic valve. No stenosis of the aortic valve.  Mild aortic annular calcification noted.   5. The mitral valve is abnormal. Mild thickening of the mitral valve  leaflet. Mild calcification of the mitral valve leaflet. There is mild  mitral annular calcification present. No evidence of mitral valve  stenosis.   6. The aortic root is normal in size and structure.   7. Pulmonary hypertension is indeterminant, inadequate TR jet.     Past Medical History:  Diagnosis Date   Arthritis    Cataract    Diabetes mellitus without complication ([place])    pt is not on diabetic medication   GERD (gastroesophageal reflux disease)    Glaucoma    worse in left eye than right eye   Heel pain    right heel pain   Hypertension    Osteoporosis    Personal history of colonic polyps 12/09/2012   Plantar fasciitis    right foot    Past Surgical History:  Procedure Laterality Date   BREAST LUMPECTOMY  10 + yrs.   left breast/benign   CATARACT EXTRACTION W/PHACO  02/14/2012   Procedure: CATARACT EXTRACTION PHACO AND INTRAOCULAR LENS PLACEMENT ([place]);  Surgeon: Tonny Branch, MD;  Location: [facility];  Service: Ophthalmology;  Laterality: Left;  CDE 13.73   CATARACT EXTRACTION  W/PHACO  03/03/2012   Procedure: CATARACT EXTRACTION PHACO AND INTRAOCULAR LENS PLACEMENT ([facility]);  Surgeon: Tonny Branch, MD;  Location: [facility];  Service: Ophthalmology;  Laterality: Right;  CDE: 16.49   COLONOSCOPY     percutaneous micrfasciotomy with topaz coblation wand  12/11/2015   rt foot surgery Right     Prior to Admission medications   Medication Sig Start Date End Date Taking? Authorizing Provider  LUMIGAN 0.01 % SOLN Place into both eyes daily.  02/23/19  Yes [provider]  omeprazole (PRILOSEC) 20 MG capsule TAKE ONE CAPSULE BY MOUTH TWICE A DAY BEFORE MEALS (NEEDS TO BE SEEN BEFORE NEXT REFILL) 07/13/21  Yes Evelina Dun A, FNP  valsartan-hydrochlorothiazide (DIOVAN-HCT) 160-25 MG tablet TAKE ONE (1) TABLET EACH DAY 07/13/21  Yes Hawks, [name] A, FNP  alendronate (FOSAMAX) 70 MG tablet Take 1 tablet (70 mg total) by mouth every 7 (seven) days. Take with a full glass of water on an empty stomach. Patient  not taking: Reported on 07/21/2021 07/17/21 07/17/22  Sharion Balloon, FNP    Current Outpatient Medications  Medication Sig Dispense Refill   LUMIGAN 0.01 % SOLN Place into both eyes daily.      omeprazole (PRILOSEC) 20 MG capsule TAKE ONE CAPSULE BY MOUTH TWICE A DAY BEFORE MEALS (NEEDS TO BE SEEN BEFORE NEXT REFILL) 180 capsule 1   valsartan-hydrochlorothiazide (DIOVAN-HCT) 160-25 MG tablet TAKE ONE (1) TABLET EACH DAY 90 tablet 2   alendronate (FOSAMAX) 70 MG tablet Take 1 tablet (70 mg total) by mouth every 7 (seven) days. Take with a full glass of water on an empty stomach. (Patient not taking: Reported on 07/21/2021) 4 tablet 11   Current Facility-Administered Medications  Medication Dose Route Frequency Provider Last Rate Last Admin   0.9 %  sodium chloride infusion  500 mL Intravenous Continuous Gatha Mayer, MD        Allergies as of 07/21/2021 - Review Complete 07/21/2021  Allergen Reaction Noted   Metformin and related Nausea And Vomiting 04/25/2016    Aspirin Nausea Only and Other (See Comments) 12/09/2011   Nsaids  03/22/2011    Family History  Problem Relation Age of Onset   Colon polyps Mother    Diabetes Mother    Pulmonary fibrosis Mother    Colon cancer Maternal Aunt    Pulmonary fibrosis Maternal Aunt    Heart disease Father    Stroke Father    Dementia Father    Lung disease Father        realted to asbestos exposure   Thyroid disease Brother    Diabetes Brother    Hypertension Brother    Meniere's disease Brother    Pulmonary fibrosis Maternal Aunt    Anesthesia problems Neg Hx    Hypotension Neg Hx    Malignant hyperthermia Neg Hx    Pseudochol deficiency Neg Hx     Social History   Socioeconomic History   Marital status: Married    Spouse name: Louis   Number of children: 3   Years of education: 12   Highest education level: 12th grade  Occupational History   Occupation: retired  Tobacco Use   Smoking status: Former    Packs/day: 1.00    Years: 5.00    Pack years: 5.00    Types: Cigarettes    Start date: 03/10/1962    Quit date: 03/11/1967    Years since quitting: 54.4   Smokeless tobacco: Never   Tobacco comments:    smoked for 4-5 years from age 21- 77 years old  Vaping Use   Vaping Use: Never used  Substance and Sexual Activity   Alcohol use: No    Alcohol/week: 0.0 standard drinks   Drug use: No   Sexual activity: Not Currently  Other Topics Concern   Not on file  Social History Narrative   Not on file   Social Determinants of Health   Financial Resource Strain: Not on file  Food Insecurity: Not on file  Transportation Needs: Not on file  Physical Activity: Not on file  Stress: Not on file  Social Connections: Not on file  Intimate Partner Violence: Not on file    Review of Systems:  All other review of systems negative except as mentioned in the HPI.  Physical Exam: Vital signs BP 138/65   Pulse 75   Temp (!) 96.8 F (36 C)   Ht _0  (1.499 m)   Wt 205 lb (93 kg)  SpO2 97%   BMI 41.40 kg/m   General:   Alert,  Well-developed, well-nourished, pleasant and cooperative in NAD Lungs:  Clear throughout to auscultation.   Heart:  Regular rate and rhythm; no murmurs, clicks, rubs,  or gallops. Abdomen:  Soft, nontender and nondistended. Normal bowel sounds.   Neuro/Psych:  Alert and cooperative. Normal mood and affect. A and O x 3   _0  E. Carlean Purl, MD, [facility] [phone] (pager) 07/21/2021 3:13 PM@

## 2021-07-21 NOTE — Progress Notes (Signed)
A/ox3, pleased with MAC, report to RN 

## 2021-07-21 NOTE — Op Note (Signed)
[facility] Patient Name: Robin Lindsey Procedure Date: 07/21/2021 3:20 PM MRN: [id number] Endoscopist: Gatha Mayer , MD Age: 75 Referring MD:  Date of Birth: 04/15/1946 Gender: Female Account #: [id number] Procedure:                Upper GI endoscopy Indications:              Heartburn, Esophageal reflux, Diarrhea Medicines:                Propofol per Anesthesia, Monitored Anesthesia Care Procedure:                Pre-Anesthesia Assessment:                           - Prior to the procedure, a History and Physical                            was performed, and patient medications and                            allergies were reviewed. The patient's tolerance of                            previous anesthesia was also reviewed. The risks                            and benefits of the procedure and the sedation                            options and risks were discussed with the patient.                            All questions were answered, and informed consent                            was obtained. Prior Anticoagulants: The patient has                            taken no previous anticoagulant or antiplatelet                            agents. ASA Grade Assessment: III - A patient with                            severe systemic disease. After reviewing the risks                            and benefits, the patient was deemed in                            satisfactory condition to undergo the procedure.                           After obtaining informed consent, the endoscope was  passed under direct vision. Throughout the                            procedure, the patient's blood pressure, pulse, and                            oxygen saturations were monitored continuously. The                            GIF [id number] [id number] was introduced through the                            mouth, and advanced to the second part of duodenum.                             The upper GI endoscopy was accomplished without                            difficulty. The patient tolerated the procedure                            fairly well. Scope In: Scope Out: Findings:                 A non-obstructing Schatzki ring was found at the                            gastroesophageal junction. This was biopsied with a                            cold forceps for disruption. No specimen. Estimated                            blood loss was minimal.                           A small hiatal hernia was present.                           Multiple diminutive sessile polyps with no stigmata                            of recent bleeding were found in the stomach.                            Biopsies were taken with a cold forceps for                            histology. Verification of patient identification                            for the specimen was done. Estimated blood loss was                            minimal.  Patchy mildly erythematous mucosa without bleeding                            was found in the prepyloric region of the stomach.                            Biopsies were taken with a cold forceps for                            histology. Verification of patient identification                            for the specimen was done. Estimated blood loss was                            minimal.                           The examined duodenum was normal. Complications:            No immediate complications. Estimated Blood Loss:     Estimated blood loss was minimal. Impression:               - Non-obstructing Schatzki ring. Biopsied.                           - Small hiatal hernia.                           - Multiple gastric polyps. Biopsied. A distal more                            erythematous polyp was put in separate bottle                           - Erythematous mucosa in the prepyloric region of                            the  stomach. Biopsied.                           - Normal examined duodenum. Recommendation:           - Patient has a contact number available for                            emergencies. The signs and symptoms of potential                            delayed complications were discussed with the                            patient. Return to normal activities tomorrow.                            Written discharge instructions were provided to the  patient.                           - See the other procedure note for documentation of                            additional recommendations.                           - Await pathology results. Gatha Mayer, MD 07/21/2021 3:53:43 PM This report has been signed electronically.

## 2021-07-21 NOTE — Op Note (Addendum)
[facility] Patient Name: Robin Lindsey Procedure Date: 07/21/2021 3:19 PM MRN: [id number] Endoscopist: Gatha Mayer , MD Age: 75 Referring MD:  Date of Birth: July 28, 1946 Gender: Female Account #: [id number] Procedure:                Colonoscopy Indications:              Clinically significant diarrhea of unexplained                            origin Medicines:                Propofol per Anesthesia, Monitored Anesthesia Care Procedure:                Pre-Anesthesia Assessment:                           - Prior to the procedure, a History and Physical                            was performed, and patient medications and                            allergies were reviewed. The patient's tolerance of                            previous anesthesia was also reviewed. The risks                            and benefits of the procedure and the sedation                            options and risks were discussed with the patient.                            All questions were answered, and informed consent                            was obtained. Prior Anticoagulants: The patient has                            taken no previous anticoagulant or antiplatelet                            agents. ASA Grade Assessment: III - A patient with                            severe systemic disease. After reviewing the risks                            and benefits, the patient was deemed in                            satisfactory condition to undergo the procedure.  After obtaining informed consent, the colonoscope                            was passed under direct vision. Throughout the                            procedure, the patient's blood pressure, pulse, and                            oxygen saturations were monitored continuously. The                            CF [id number] was introduced through the anus                            and advanced to the the  cecum, identified by                            appendiceal orifice and ileocecal valve. The                            colonoscopy was somewhat difficult due to                            significant looping. Successful completion of the                            procedure was aided by applying abdominal pressure.                            The patient tolerated the procedure well. The                            quality of the bowel preparation was good. The                            ileocecal valve, appendiceal orifice, and rectum                            were photographed. Scope In: 3:33:30 PM Scope Out: 3:43:58 PM Scope Withdrawal Time: 0 hours 7 minutes 5 seconds  Total Procedure Duration: 0 hours 10 minutes 28 seconds  Findings:                 The perianal and digital rectal examinations were                            normal.                           Multiple diverticula were found in the sigmoid                            colon.  The exam was otherwise without abnormality on                            direct and retroflexion views.                           Biopsies for histology were taken with a cold                            forceps from the right colon and left colon for                            evaluation of microscopic colitis. Complications:            No immediate complications. Estimated Blood Loss:     Estimated blood loss was minimal. Impression:               - Diverticulosis in the sigmoid colon.                           - The examination was otherwise normal on direct                            and retroflexion views. except for mildly inflamed                            hemorrhoids.                           - Biopsies were taken with a cold forceps from the                            right colon and left colon for evaluation of                            microscopic colitis. Recommendation:           - Patient has a contact  number available for                            emergencies. The signs and symptoms of potential                            delayed complications were discussed with the                            patient. Return to normal activities tomorrow.                            Written discharge instructions were provided to the                            patient.                           - Resume previous diet.                           -  Continue present medications.                           - Await pathology results.                           - No repeat colonoscopy. She has urinary and fecal                            incontinence so could be a PT candidate Gatha Mayer, MD 07/21/2021 3:55:50 PM This report has been signed electronically.

## 2021-07-21 NOTE — Progress Notes (Signed)
Called to room to assist during endoscopic procedure.  Patient ID and intended procedure confirmed with present staff. Received instructions for my participation in the procedure from the performing physician.  

## 2021-07-21 NOTE — Final Progress Note (Signed)
Vs by Keitha Butte

## 2021-07-21 NOTE — Patient Instructions (Addendum)
The stomach revealed some innocent-looking polyps, and some mild inflammation that I biopsied. There was some scar tissue where esophagus and stomach meet and that could possibly cause swallowing issues so I opened that some. Also have a hiatal hernia.  Colon lining looks fine but I took biopsies. Once I see biopsy results will make recommendations.  I appreciate the opportunity to care for you. Gatha Mayer, MD, [facility]  Please read handouts provided. Continue present medications.   YOU HAD AN ENDOSCOPIC PROCEDURE TODAY AT [facility]:   Refer to the procedure report that was given to you for any specific questions about what was found during the examination.  If the procedure report does not answer your questions, please call your gastroenterologist to clarify.  If you requested that your care partner not be given the details of your procedure findings, then the procedure report has been included in a sealed envelope for you to review at your convenience later.  YOU SHOULD EXPECT: Some feelings of bloating in the abdomen. Passage of more gas than usual.  Walking can help get rid of the air that was put into your GI tract during the procedure and reduce the bloating. If you had a lower endoscopy (such as a colonoscopy or flexible sigmoidoscopy) you may notice spotting of blood in your stool or on the toilet paper. If you underwent a bowel prep for your procedure, you may not have a normal bowel movement for a few days.  Please Note:  You might notice some irritation and congestion in your nose or some drainage.  This is from the oxygen used during your procedure.  There is no need for concern and it should clear up in a day or so.  SYMPTOMS TO REPORT IMMEDIATELY:  Following lower endoscopy (colonoscopy or flexible sigmoidoscopy):  Excessive amounts of blood in the stool  Significant tenderness or worsening of abdominal pains  Swelling of the abdomen that is new, acute  Fever  of 100F or higher  Following upper endoscopy (EGD)  Vomiting of blood or coffee ground material  New chest pain or pain under the shoulder blades  Painful or persistently difficult swallowing  New shortness of breath  Fever of 100F or higher  Black, tarry-looking stools  For urgent or emergent issues, a gastroenterologist can be reached at any hour by calling [phone]. Do not use MyChart messaging for urgent concerns.    DIET:  We do recommend a small meal at first, but then you may proceed to your regular diet.  Drink plenty of fluids but you should avoid alcoholic beverages for 24 hours.  ACTIVITY:  You should plan to take it easy for the rest of today and you should NOT DRIVE or use heavy machinery until tomorrow (because of the sedation medicines used during the test).    FOLLOW UP: Our staff will call the number listed on your records 48-72 hours following your procedure to check on you and address any questions or concerns that you may have regarding the information given to you following your procedure. If we do not reach you, we will leave a message.  We will attempt to reach you two times.  During this call, we will ask if you have developed any symptoms of COVID 19. If you develop any symptoms (ie: fever, flu-like symptoms, shortness of breath, cough etc.) before then, please call [phone].  If you test positive for Covid 19 in the 2 weeks post procedure, please call and  report this information to [place].    If any biopsies were taken you will be contacted by phone or by letter within the next 1-3 weeks.  Please call us at [phone] if you have not heard about the biopsies in 3 weeks.    SIGNATURES/CONFIDENTIALITY: You and/or your care partner have signed paperwork which will be entered into your electronic medical record.  These signatures attest to the fact that that the information above on your After Visit Summary has been reviewed and is understood.  Full  responsibility of the confidentiality of this discharge information lies with you and/or your care-partner.

## 2021-07-25 ENCOUNTER — Telehealth: Payer: Self-pay | Admitting: *Deleted

## 2021-07-25 NOTE — Telephone Encounter (Signed)
  Follow up Call-  Call back number 07/21/2021  Post procedure Call Back phone  # [phone]  Permission to leave phone message Yes  Some recent data might be hidden     Patient questions:  Do you have a fever, pain , or abdominal swelling? No. Pain Score  0 *  Have you tolerated food without any problems? Yes.    Have you been able to return to your normal activities? Yes.    Do you have any questions about your discharge instructions: Diet   No. Medications  No. Follow up visit  No.  Do you have questions or concerns about your Care? No.  Actions: * If pain score is 4 or above: No action needed, pain <4.  Have you developed a fever since your procedure? no  2.   Have you had an respiratory symptoms (SOB or cough) since your procedure? no  3.   Have you tested positive for COVID 19 since your procedure no  4.   Have you had any family members/close contacts diagnosed with the COVID 19 since your procedure?  no   If yes to any of these questions please route to Joylene John, RN and Joella Prince, RN

## 2021-08-07 ENCOUNTER — Other Ambulatory Visit: Payer: Self-pay

## 2021-08-07 MED ORDER — CHOLESTYRAMINE 4 G PO PACK: 4.0000 g | PACK | Freq: Every day | ORAL | 3 refills | Status: DC

## 2021-08-22 ENCOUNTER — Ambulatory Visit: Payer: Medicare Other | Admitting: Pharmacist

## 2021-08-29 ENCOUNTER — Ambulatory Visit (INDEPENDENT_AMBULATORY_CARE_PROVIDER_SITE_OTHER): Payer: Medicare Other | Admitting: Pharmacist

## 2021-08-29 ENCOUNTER — Other Ambulatory Visit: Payer: Self-pay

## 2021-08-29 DIAGNOSIS — M810 Age-related osteoporosis without current pathological fracture: Secondary | ICD-10-CM

## 2021-08-29 NOTE — Progress Notes (Signed)
     08/29/2021 Name: Robin Lindsey MRN: [id number] DOB: May 12, 1946   S:  89 YOF Presents for osteoporosis education, and management  Current Height:    4'11"   Max Lifetime Height:  4'11" Current Weight:   205LBS      Ethnicity:Caucasian  HPI:  Osteoporosis?  Yes  Back Pain?  No       Kyphosis?  No Prior fracture?  No Med(s) for Osteoporosis/Osteopenia:  N/A Med(s) previously tried for Osteoporosis/Osteopenia:  FOSAMAX                                                             PMH: Hysterectomy?  No Oophorectomy?  No HRT? No Steroid Use?  No Thyroid med?  No History of cancer?  No History of digestive disorders (ie Crohn's)?  IBS Current or previous eating disorders?  No Last Vitamin D Result:  17.4 (2017) Last GFR Result:  59 (8.2022)   FH/SH: Family history of osteoporosis?  Yes MOM  Parent with history of hip fracture?  Yes MOM 45 YOF Family history of breast cancer?  No Exercise?  No Smoking?  No Alcohol?  No    Calcium Assessment Calcium Intake  # of servings/day  Calcium mg  Milk (8 oz) 0  x  300  = 0  Yogurt (4 oz) 0 x  200 = 0  Cheese (1 oz) 1 x  200 = 200  Other Calcium sources   250mg   Ca supplement 0 = 0   Estimated calcium intake per day 450    DEXA Results 07/13/21 FINDINGS: AP LUMBAR SPINE L1-L4   Bone Mineral Density (BMD):  0.886 g/cm2 Young Adult T-Score:  -2.5 Z-Score:  -1.7   RIGHT FEMUR NECK   Bone Mineral Density (BMD):  0.718 g/cm2 Young Adult T-Score: -2.3  Z-Score:  -0.9   Assessment: ASSESSMENT: Patient's diagnostic category is OSTEOPOROSIS by WHO Criteria.   FRACTURE RISK: INCREASED   FRAX: World Health Organization FRAX assessment of absolute fracture risk is not calculated for this patient because the patient has osteoporosis.   COMPARISON: 08/22/2016. There has been a significant decrease in bone mineral density of the hips and lumbar spine.  Recommendations: CANT TAKE FOSAMAX DUE TO ADVERSE EVENTS  INTRESETED  IN [place], HOWEVER NEED TO DETERMINE COST WILL DO BENEFITS INVESTIGATION  2.  recommend calcium 1200mg  daily through supplementation or diet.  3.  recommend weight bearing exercise - 30 minutes at least 4 days per week.   4.  Counseled and educated about fall risk and prevention.  Recheck DEXA:  2 years  Time spent counseling patient:  25 minutes   Regina Eck, PharmD, BCPS Clinical Pharmacist, [facility] Phone [phone]

## 2021-09-05 ENCOUNTER — Telehealth: Payer: Self-pay | Admitting: Pharmacist

## 2021-09-05 MED ORDER — DENOSUMAB 60 MG/ML ~~LOC~~ SOSY: 60.0000 mg | PREFILLED_SYRINGE | Freq: Once | SUBCUTANEOUS | 1 refills | Status: AC

## 2021-09-05 NOTE — Telephone Encounter (Signed)
[place] BENEFITS INVESTIGATION-POTENTIAL NEW START  PATIENT TO DETERMINE THERAPY BASED ON COST

## 2021-09-06 ENCOUNTER — Telehealth: Payer: Self-pay | Admitting: Family

## 2021-09-06 NOTE — Telephone Encounter (Signed)
Robin Lindsey, I spoke with patient and she said she picked up the medication from [facility] in [place] today and it was $300.  Unless i'm overlooking something, I do not see where we even sent in a prescription for this medicine.  Is it okay to go ahead and get her scheduled for an appointment?  She is aware you are out of the office until tomorrow and I wanted to discuss this with you.

## 2021-09-07 ENCOUNTER — Ambulatory Visit (INDEPENDENT_AMBULATORY_CARE_PROVIDER_SITE_OTHER): Payer: Medicare Other

## 2021-09-07 VITALS — Ht 59.0 in | Wt 205.0 lb

## 2021-09-07 DIAGNOSIS — Z0000 Encounter for general adult medical examination without abnormal findings: Secondary | ICD-10-CM | POA: Diagnosis not present

## 2021-09-07 NOTE — Patient Instructions (Signed)
Robin Lindsey , Thank you for taking time to come for your Medicare Wellness Visit. I appreciate your ongoing commitment to your health goals. Please review the following plan we discussed and let me know if I can assist you in the future.   Screening recommendations/referrals: Colonoscopy: Done 07/21/2021 - Repeat in 5 years Mammogram: Done 03/02/2021 - Repeat annually Bone Density: Done 07/14/2021 - Repeat every 2 years Recommended yearly ophthalmology/optometry visit for glaucoma screening and checkup Recommended yearly dental visit for hygiene and checkup  Vaccinations: Influenza vaccine: Done 10/12/2020 - Repeat annually  Pneumococcal vaccine: Done 10/06/2013 & 02/16/2016 Tdap vaccine: Due. Every 10 years  Shingles vaccine: First done 07/13/2021 - get second dose in 2-6 months   Covid-19:Done 02/2020 & 03/2020 -due for boosters  Advanced directives: Please bring a copy of your health care power of attorney and living will to the office to be added to your chart at your convenience.   Conditions/risks identified: Aim for 30 minutes of exercise or brisk walking each day, drink 6-8 glasses of water and eat lots of fruits and vegetables.  Next appointment: Follow up in one year for your annual wellness visit    Preventive Care 65 Years and Older, Female Preventive care refers to lifestyle choices and visits with your health care provider that can promote health and wellness. What does preventive care include? A yearly physical exam. This is also called an annual well check. Dental exams once or twice a year. Routine eye exams. Ask your health care provider how often you should have your eyes checked. Personal lifestyle choices, including: Daily care of your teeth and gums. Regular physical activity. Eating a healthy diet. Avoiding tobacco and drug use. Limiting alcohol use. Practicing safe sex. Taking low-dose aspirin every day. Taking vitamin and mineral supplements as recommended by your  health care provider. What happens during an annual well check? The services and screenings done by your health care provider during your annual well check will depend on your age, overall health, lifestyle risk factors, and family history of disease. Counseling  Your health care provider may ask you questions about your: Alcohol use. Tobacco use. Drug use. Emotional well-being. Home and relationship well-being. Sexual activity. Eating habits. History of falls. Memory and ability to understand (cognition). Work and work Statistician. Reproductive health. Screening  You may have the following tests or measurements: Height, weight, and BMI. Blood pressure. Lipid and cholesterol levels. These may be checked every 5 years, or more frequently if you are over 35 years old. Skin check. Lung cancer screening. You may have this screening every year starting at age 46 if you have a 30-pack-year history of smoking and currently smoke or have quit within the past 15 years. Fecal occult blood test (FOBT) of the stool. You may have this test every year starting at age 36. Flexible sigmoidoscopy or colonoscopy. You may have a sigmoidoscopy every 5 years or a colonoscopy every 10 years starting at age 25. Hepatitis C blood test. Hepatitis B blood test. Sexually transmitted disease (STD) testing. Diabetes screening. This is done by checking your blood sugar (glucose) after you have not eaten for a while (fasting). You may have this done every 1-3 years. Bone density scan. This is done to screen for osteoporosis. You may have this done starting at age 87. Mammogram. This may be done every 1-2 years. Talk to your health care provider about how often you should have regular mammograms. Talk with your health care provider about your test  results, treatment options, and if necessary, the need for more tests. Vaccines  Your health care provider may recommend certain vaccines, such as: Influenza vaccine.  This is recommended every year. Tetanus, diphtheria, and acellular pertussis (Tdap, Td) vaccine. You may need a Td booster every 10 years. Zoster vaccine. You may need this after age 50. Pneumococcal 13-valent conjugate (PCV13) vaccine. One dose is recommended after age 45. Pneumococcal polysaccharide (PPSV23) vaccine. One dose is recommended after age 85. Talk to your health care provider about which screenings and vaccines you need and how often you need them. This information is not intended to replace advice given to you by your health care provider. Make sure you discuss any questions you have with your health care provider. Document Released: 12/23/2015 Document Revised: 08/15/2016 Document Reviewed: 09/27/2015 Elsevier Interactive Patient Education  2017 [place] Prevention in the Home Falls can cause injuries. They can happen to people of all ages. There are many things you can do to make your home safe and to help prevent falls. What can I do on the outside of my home? Regularly fix the edges of walkways and driveways and fix any cracks. Remove anything that might make you trip as you walk through a door, such as a raised step or threshold. Trim any bushes or trees on the path to your home. Use bright outdoor lighting. Clear any walking paths of anything that might make someone trip, such as rocks or tools. Regularly check to see if handrails are loose or broken. Make sure that both sides of any steps have handrails. Any raised decks and porches should have guardrails on the edges. Have any leaves, snow, or ice cleared regularly. Use sand or salt on walking paths during winter. Clean up any spills in your garage right away. This includes oil or grease spills. What can I do in the bathroom? Use night lights. Install grab bars by the toilet and in the tub and shower. Do not use towel bars as grab bars. Use non-skid mats or decals in the tub or shower. If you need to sit  down in the shower, use a plastic, non-slip stool. Keep the floor dry. Clean up any water that spills on the floor as soon as it happens. Remove soap buildup in the tub or shower regularly. Attach bath mats securely with double-sided non-slip rug tape. Do not have throw rugs and other things on the floor that can make you trip. What can I do in the bedroom? Use night lights. Make sure that you have a light by your bed that is easy to reach. Do not use any sheets or blankets that are too big for your bed. They should not hang down onto the floor. Have a firm chair that has side arms. You can use this for support while you get dressed. Do not have throw rugs and other things on the floor that can make you trip. What can I do in the kitchen? Clean up any spills right away. Avoid walking on wet floors. Keep items that you use a lot in easy-to-reach places. If you need to reach something above you, use a strong step stool that has a grab bar. Keep electrical cords out of the way. Do not use floor polish or wax that makes floors slippery. If you must use wax, use non-skid floor wax. Do not have throw rugs and other things on the floor that can make you trip. What can I do with my stairs?  Do not leave any items on the stairs. Make sure that there are handrails on both sides of the stairs and use them. Fix handrails that are broken or loose. Make sure that handrails are as long as the stairways. Check any carpeting to make sure that it is firmly attached to the stairs. Fix any carpet that is loose or worn. Avoid having throw rugs at the top or bottom of the stairs. If you do have throw rugs, attach them to the floor with carpet tape. Make sure that you have a light switch at the top of the stairs and the bottom of the stairs. If you do not have them, ask someone to add them for you. What else can I do to help prevent falls? Wear shoes that: Do not have high heels. Have rubber bottoms. Are  comfortable and fit you well. Are closed at the toe. Do not wear sandals. If you use a stepladder: Make sure that it is fully opened. Do not climb a closed stepladder. Make sure that both sides of the stepladder are locked into place. Ask someone to hold it for you, if possible. Clearly mark and make sure that you can see: Any grab bars or handrails. First and last steps. Where the edge of each step is. Use tools that help you move around (mobility aids) if they are needed. These include: Canes. Walkers. Scooters. Crutches. Turn on the lights when you go into a dark area. Replace any light bulbs as soon as they burn out. Set up your furniture so you have a clear path. Avoid moving your furniture around. If any of your floors are uneven, fix them. If there are any pets around you, be aware of where they are. Review your medicines with your doctor. Some medicines can make you feel dizzy. This can increase your chance of falling. Ask your doctor what other things that you can do to help prevent falls. This information is not intended to replace advice given to you by your health care provider. Make sure you discuss any questions you have with your health care provider. Document Released: 09/22/2009 Document Revised: 05/03/2016 Document Reviewed: 12/31/2014 Elsevier Interactive Patient Education  2017 [employer].

## 2021-09-07 NOTE — Progress Notes (Signed)
Subjective:   Robin Lindsey is a 75 y.o. female who presents for Medicare Annual (Subsequent) preventive examination.  Virtual Visit via Telephone Note  I connected with  Suzzette Righter on 09/07/21 at  2:45 PM EDT by telephone and verified that I am speaking with the correct person using two identifiers.  Location: Patient: Home Provider: [facility] Persons participating in the virtual visit: patient/Nurse Health Advisor   I discussed the limitations, risks, security and privacy concerns of performing an evaluation and management service by telephone and the availability of in person appointments. The patient expressed understanding and agreed to proceed.  Interactive audio and video telecommunications were attempted between this nurse and patient, however failed, due to patient having technical difficulties OR patient did not have access to video capability.  We continued and completed visit with audio only.  Some vital signs may be absent or patient reported.   Dajah Fischman E Mckaylie Vasey, LPN   Review of Systems     Cardiac Risk Factors include: advanced age (>61men, >68 women);diabetes mellitus;sedentary lifestyle;obesity (BMI >30kg/m2);hypertension     Objective:    Today's Vitals   09/07/21 1433  Weight: 205 lb (93 kg)  Height: 4\' 11"  (1.499 m)   Body mass index is 41.4 kg/m.  Advanced Directives 09/07/2021 08/19/2019 08/14/2018 05/21/2016 04/04/2016 02/11/2012  Does Patient Have a Medical Advance Directive? No No No No Yes Patient does not have advance directive;Patient would not like information  Type of Advance Directive - - - - [place] -  Would patient like information on creating a medical advance directive? No - Patient declined No - Patient declined No - Patient declined Yes - Educational materials given - -    Current Medications (verified) Outpatient Encounter Medications as of 09/07/2021  Medication Sig   cholestyramine (QUESTRAN) 4 g packet Take 1 packet (4 g  total) by mouth daily with supper.   LUMIGAN 0.01 % SOLN Place into both eyes daily.    omeprazole (PRILOSEC) 20 MG capsule TAKE ONE CAPSULE BY MOUTH TWICE A DAY BEFORE MEALS (NEEDS TO BE SEEN BEFORE NEXT REFILL)   valsartan-hydrochlorothiazide (DIOVAN-HCT) 160-25 MG tablet TAKE ONE (1) TABLET EACH DAY   No facility-administered encounter medications on file as of 09/07/2021.    Allergies (verified) Metformin and related, Aspirin, and Nsaids   History: Past Medical History:  Diagnosis Date   Arthritis    Cataract    Diabetes mellitus without complication ([place])    pt is not on diabetic medication   GERD (gastroesophageal reflux disease)    Glaucoma    worse in left eye than right eye   Heel pain    right heel pain   Hypertension    Osteoporosis    Personal history of colonic polyps 12/09/2012   Plantar fasciitis    right foot   Past Surgical History:  Procedure Laterality Date   BREAST LUMPECTOMY  10 + yrs.   left breast/benign   CATARACT EXTRACTION W/PHACO  02/14/2012   Procedure: CATARACT EXTRACTION PHACO AND INTRAOCULAR LENS PLACEMENT ([place]);  Surgeon: Tonny Branch, MD;  Location: [facility];  Service: Ophthalmology;  Laterality: Left;  CDE 13.73   CATARACT EXTRACTION W/PHACO  03/03/2012   Procedure: CATARACT EXTRACTION PHACO AND INTRAOCULAR LENS PLACEMENT ([facility]);  Surgeon: Tonny Branch, MD;  Location: [facility];  Service: Ophthalmology;  Laterality: Right;  CDE: 16.49   COLONOSCOPY     percutaneous micrfasciotomy with topaz coblation wand  12/11/2015   rt foot surgery Right  Family History  Problem Relation Age of Onset   Colon polyps Mother    Diabetes Mother    Pulmonary fibrosis Mother    Colon cancer Maternal Aunt    Pulmonary fibrosis Maternal Aunt    Heart disease Father    Stroke Father    Dementia Father    Lung disease Father        realted to asbestos exposure   Thyroid disease Brother    Diabetes Brother    Hypertension Brother    Meniere's disease Brother     Pulmonary fibrosis Maternal Aunt    Anesthesia problems Neg Hx    Hypotension Neg Hx    Malignant hyperthermia Neg Hx    Pseudochol deficiency Neg Hx    Social History   Socioeconomic History   Marital status: Married    Spouse name: Louis   Number of children: 3   Years of education: 12   Highest education level: 12th grade  Occupational History   Occupation: retired  Tobacco Use   Smoking status: Former    Packs/day: 1.00    Years: 5.00    Pack years: 5.00    Types: Cigarettes    Start date: 03/10/1962    Quit date: 03/11/1967    Years since quitting: 54.5   Smokeless tobacco: Never   Tobacco comments:    smoked for 4-5 years from age 41- 15 years old  Vaping Use   Vaping Use: Never used  Substance and Sexual Activity   Alcohol use: No    Alcohol/week: 0.0 standard drinks   Drug use: No   Sexual activity: Not Currently  Other Topics Concern   Not on file  Social History Narrative   Not on file   Social Determinants of Health   Financial Resource Strain: Low Risk    Difficulty of Paying Living Expenses: Not hard at all  Food Insecurity: No Food Insecurity   Worried About Charity fundraiser in the Last Year: Never true   [place] in the Last Year: Never true  Transportation Needs: No Transportation Needs   Lack of Transportation (Medical): No   Lack of Transportation (Non-Medical): No  Physical Activity: Inactive   Days of Exercise per Week: 0 days   Minutes of Exercise per Session: 0 min  Stress: No Stress Concern Present   Feeling of Stress : Not at all  Social Connections: Moderately Integrated   Frequency of Communication with Friends and Family: More than three times a week   Frequency of Social Gatherings with Friends and Family: More than three times a week   Attends Religious Services: More than 4 times per year   Active Member of [employer] or Organizations: No   Attends Music therapist: Never   Marital Status: Married     Tobacco Counseling Counseling given: Not Answered Tobacco comments: smoked for 4-5 years from age [age] years old   Clinical Intake:  Pre-visit preparation completed: Yes  Pain : No/denies pain     BMI - recorded: 41.4 Nutritional Status: BMI > 30  Obese Nutritional Risks: None  How often do you need to have someone help you when you read instructions, pamphlets, or other written materials from your doctor or pharmacy?: 1 - Never  Diabetic?no  Interpreter Needed?: No  Information entered by :: Colm Lyford, LPN   Activities of Daily Living In your present state of health, do you have any difficulty performing the following activities: 09/07/2021  Hearing? N  Vision? N  Difficulty concentrating or making decisions? N  Walking or climbing stairs? N  Dressing or bathing? N  Doing errands, shopping? N  Preparing Food and eating ? N  Using the Toilet? N  In the past six months, have you accidently leaked urine? Y  Do you have problems with loss of bowel control? N  Managing your Medications? N  Managing your Finances? N  Housekeeping or managing your Housekeeping? N  Some recent data might be hidden    Patient Care Team: Sharion Balloon, FNP as PCP - General (Family Medicine) Herminio Commons, MD (Inactive) as PCP - Cardiology (Cardiology) Clent Jacks, MD as Consulting Physician (Ophthalmology) Steffanie Rainwater, DPM as Consulting Physician (Podiatry) Maisie Fus, MD as Consulting Physician (Obstetrics and Gynecology) Lavera Guise, [facility] as Pharmacist (Family Medicine)  Indicate any recent Medical Services you may have received from other than [facility] providers in the past year (date may be approximate).     Assessment:   This is a routine wellness examination for Kandie.  Hearing/Vision screen Hearing Screening - Comments:: Denies hearing difficulties  Vision Screening - Comments:: Wears reading glasses prn only - up to date with Dr Katy Fitch annual eye  exams  Dietary issues and exercise activities discussed: Current Exercise Habits: The patient does not participate in regular exercise at present, Exercise limited by: None identified   Goals Addressed             This Visit's Progress    DIET - INCREASE WATER INTAKE   On track    Try to drink 6-8 glasses of water daily.     Exercise 150 min/wk Moderate Activity   Not on track      Depression Screen PHQ 2/9 Scores 09/07/2021 06/24/2020 06/08/2020 11/12/2019 09/18/2019 08/19/2019 07/20/2019  PHQ - 2 Score 0 0 0 0 0 0 0    Fall Risk Fall Risk  09/07/2021 07/13/2021 06/24/2020 06/08/2020 11/12/2019  Falls in the past year? 0 0 0 0 0  Number falls in past yr: 0 - - - -  Injury with Fall? 0 - - - -  Risk for fall due to : No Fall Risks No Fall Risks - - -  Follow up Falls prevention discussed Education provided - - -  Comment - - - - -    FALL RISK PREVENTION PERTAINING TO THE HOME:  Any stairs in or around the home? Yes  If so, are there any without handrails? No  Home free of loose throw rugs in walkways, pet beds, electrical cords, etc? Yes  Adequate lighting in your home to reduce risk of falls? Yes   ASSISTIVE DEVICES UTILIZED TO PREVENT FALLS:  Life alert? No  Use of a cane, walker or w/c? No  Grab bars in the bathroom? Yes  Shower chair or bench in shower? No  Elevated toilet seat or a handicapped toilet? No   TIMED UP AND GO:  Was the test performed? No . Telephonic visit  Cognitive Function: Normal cognitive status assessed by direct observation by this Nurse Health Advisor. No abnormalities found.   MMSE - Mini Mental State Exam 08/14/2018 05/21/2016  Orientation to time 5 5  Orientation to Place 5 5  Registration 3 3  Attention/ Calculation 5 5  Recall 3 2  Language- name 2 objects 2 2  Language- repeat 1 1  Language- follow 3 step command 3 3  Language- read & follow direction 1 1  Write  a sentence 1 1  Copy design 1 1  Total score 30 29     6CIT Screen  08/19/2019  What Year? 0 points  What month? 0 points  What time? 0 points  Count back from 20 0 points  Months in reverse 0 points  Repeat phrase 0 points  Total Score 0    Immunizations Immunization History  Administered Date(s) Administered   Fluad Quad(high Dose 65+) 10/01/2019, 10/12/2020   Influenza, High Dose Seasonal PF 10/01/2016, 09/04/2017, 10/23/2018   Influenza,inj,Quad PF,6+ Mos 10/06/2013, 09/22/2014   Influenza-Unspecified 10/10/2015   Pneumococcal Conjugate-13 10/06/2013   Pneumococcal Polysaccharide-23 02/16/2016   Zoster Recombinat (Shingrix) 07/13/2021   Zoster, Live 04/21/2014    TDAP status: Due, Education has been provided regarding the importance of this vaccine. Advised may receive this vaccine at local pharmacy or Health Dept. Aware to provide a copy of the vaccination record if obtained from local pharmacy or Health Dept. Verbalized acceptance and understanding.  Flu Vaccine status: Due, Education has been provided regarding the importance of this vaccine. Advised may receive this vaccine at local pharmacy or Health Dept. Aware to provide a copy of the vaccination record if obtained from local pharmacy or Health Dept. Verbalized acceptance and understanding.  Pneumococcal vaccine status: Up to date  Covid-19 vaccine status: Information provided on how to obtain vaccines.   Qualifies for Shingles Vaccine? Yes   Zostavax completed Yes   Shingrix Completed?: No.    Education has been provided regarding the importance of this vaccine. Patient has been advised to call insurance company to determine out of pocket expense if they have not yet received this vaccine. Advised may also receive vaccine at local pharmacy or Health Dept. Verbalized acceptance and understanding.  Screening Tests Health Maintenance  Topic Date Due   COVID-19 Vaccine (1) Never done   Zoster Vaccines- Shingrix (2 of 2) 09/07/2021   INFLUENZA VACCINE  03/09/2022 (Originally 07/10/2021)    TETANUS/TDAP  07/13/2022 (Originally 03/11/1965)   HEMOGLOBIN A1C  01/13/2022   OPHTHALMOLOGY EXAM  05/10/2022   FOOT EXAM  07/13/2022   DEXA SCAN  07/15/2023   COLONOSCOPY (Pts 45-35yrs Insurance coverage will need to be confirmed)  07/21/2026   Hepatitis C Screening  Completed   HPV VACCINES  Aged Out    Health Maintenance  Health Maintenance Due  Topic Date Due   COVID-19 Vaccine (1) Never done   Zoster Vaccines- Shingrix (2 of 2) 09/07/2021    Colorectal cancer screening: Type of screening: Colonoscopy. Completed 07/21/2021. Repeat every 5 years  Mammogram status: Completed 03/02/2021. Repeat every year  Bone Density status: Completed 07/14/2021. Results reflect: Bone density results: OSTEOPOROSIS. Repeat every 2 years.  Lung Cancer Screening: (Low Dose CT Chest recommended if Age 33-80 years, 30 pack-year currently smoking OR have quit w/in 15years.) does not qualify.   Additional Screening:  Hepatitis C Screening: does qualify; Completed 02/16/2016  Vision Screening: Recommended annual ophthalmology exams for early detection of glaucoma and other disorders of the eye. Is the patient up to date with their annual eye exam?  Yes  Who is the provider or what is the name of the office in which the patient attends annual eye exams? [facility] If pt is not established with a provider, would they like to be referred to a provider to establish care? No .   Dental Screening: Recommended annual dental exams for proper oral hygiene  Community Resource Referral / Chronic Care Management: CRR required this visit?  No  CCM required this visit?  No      Plan:     I have personally reviewed and noted the following in the patient's chart:   Medical and social history Use of alcohol, tobacco or illicit drugs  Current medications and supplements including opioid prescriptions.  Functional ability and status Nutritional status Physical activity Advanced directives List of other  physicians Hospitalizations, surgeries, and ER visits in previous 12 months Vitals Screenings to include cognitive, depression, and falls Referrals and appointments  In addition, I have reviewed and discussed with patient certain preventive protocols, quality metrics, and best practice recommendations. A written personalized care plan for preventive services as well as general preventive health recommendations were provided to patient.     Sandrea Hammond, LPN   [id number]/[id number]   Nurse Notes: None

## 2021-09-13 DIAGNOSIS — H16223 Keratoconjunctivitis sicca, not specified as Sjogren's, bilateral: Secondary | ICD-10-CM | POA: Diagnosis not present

## 2021-09-13 DIAGNOSIS — Z961 Presence of intraocular lens: Secondary | ICD-10-CM | POA: Diagnosis not present

## 2021-09-13 DIAGNOSIS — H401123 Primary open-angle glaucoma, left eye, severe stage: Secondary | ICD-10-CM | POA: Diagnosis not present

## 2021-09-13 DIAGNOSIS — H353131 Nonexudative age-related macular degeneration, bilateral, early dry stage: Secondary | ICD-10-CM | POA: Diagnosis not present

## 2021-09-13 DIAGNOSIS — H401111 Primary open-angle glaucoma, right eye, mild stage: Secondary | ICD-10-CM | POA: Diagnosis not present

## 2021-09-13 DIAGNOSIS — E119 Type 2 diabetes mellitus without complications: Secondary | ICD-10-CM | POA: Diagnosis not present

## 2021-09-21 ENCOUNTER — Ambulatory Visit (INDEPENDENT_AMBULATORY_CARE_PROVIDER_SITE_OTHER): Payer: Medicare Other

## 2021-09-21 ENCOUNTER — Other Ambulatory Visit: Payer: Self-pay

## 2021-09-21 DIAGNOSIS — M810 Age-related osteoporosis without current pathological fracture: Secondary | ICD-10-CM | POA: Diagnosis not present

## 2021-09-21 MED ORDER — DENOSUMAB 60 MG/ML ~~LOC~~ SOSY
60.0000 mg | PREFILLED_SYRINGE | Freq: Once | SUBCUTANEOUS | Status: AC
Start: 2021-09-21 — End: 2021-09-21
  Administered 2021-09-21: 60 mg via SUBCUTANEOUS

## 2021-09-21 NOTE — Progress Notes (Signed)
Prolia injection given to left arm Patient tolerated well Patient suppllied

## 2021-11-14 ENCOUNTER — Ambulatory Visit (INDEPENDENT_AMBULATORY_CARE_PROVIDER_SITE_OTHER): Payer: Medicare Other

## 2021-11-14 DIAGNOSIS — Z23 Encounter for immunization: Secondary | ICD-10-CM

## 2021-11-19 DIAGNOSIS — M5412 Radiculopathy, cervical region: Secondary | ICD-10-CM | POA: Diagnosis not present

## 2022-01-22 DIAGNOSIS — H16223 Keratoconjunctivitis sicca, not specified as Sjogren's, bilateral: Secondary | ICD-10-CM | POA: Diagnosis not present

## 2022-01-22 DIAGNOSIS — H401123 Primary open-angle glaucoma, left eye, severe stage: Secondary | ICD-10-CM | POA: Diagnosis not present

## 2022-01-22 DIAGNOSIS — E119 Type 2 diabetes mellitus without complications: Secondary | ICD-10-CM | POA: Diagnosis not present

## 2022-01-22 DIAGNOSIS — Z961 Presence of intraocular lens: Secondary | ICD-10-CM | POA: Diagnosis not present

## 2022-01-22 DIAGNOSIS — H353131 Nonexudative age-related macular degeneration, bilateral, early dry stage: Secondary | ICD-10-CM | POA: Diagnosis not present

## 2022-01-22 DIAGNOSIS — H401111 Primary open-angle glaucoma, right eye, mild stage: Secondary | ICD-10-CM | POA: Diagnosis not present

## 2022-03-05 ENCOUNTER — Telehealth: Payer: Self-pay | Admitting: Family

## 2022-03-05 NOTE — Telephone Encounter (Signed)
Pt called stating that she is due to have her Prolia shot in 03/21/22 and has not heard anything from anyone regarding this. ? ?Please advise and call patient.  ?

## 2022-03-06 DIAGNOSIS — Z1231 Encounter for screening mammogram for malignant neoplasm of breast: Secondary | ICD-10-CM | POA: Diagnosis not present

## 2022-03-08 NOTE — Telephone Encounter (Signed)
Patient cost $301 ?No PA required ?

## 2022-03-21 NOTE — Telephone Encounter (Signed)
Pt called and aware she is due on 03/23/22. ?Aware I am ordering it today and once it comes in, we will call her to schedule the injection - aware buy and bill - $301.00 ?

## 2022-03-27 ENCOUNTER — Ambulatory Visit (INDEPENDENT_AMBULATORY_CARE_PROVIDER_SITE_OTHER): Payer: Medicare Other | Admitting: Emergency Medicine

## 2022-03-27 DIAGNOSIS — M8580 Other specified disorders of bone density and structure, unspecified site: Secondary | ICD-10-CM

## 2022-03-27 DIAGNOSIS — M8588 Other specified disorders of bone density and structure, other site: Secondary | ICD-10-CM

## 2022-03-27 MED ORDER — DENOSUMAB 60 MG/ML ~~LOC~~ SOSY
60.0000 mg | PREFILLED_SYRINGE | Freq: Once | SUBCUTANEOUS | Status: AC
  Administered 2022-03-27: 60 mg via SUBCUTANEOUS

## 2022-04-17 ENCOUNTER — Other Ambulatory Visit: Payer: Self-pay | Admitting: Family

## 2022-04-17 DIAGNOSIS — K219 Gastro-esophageal reflux disease without esophagitis: Secondary | ICD-10-CM

## 2022-05-17 ENCOUNTER — Telehealth: Payer: Self-pay | Admitting: Family

## 2022-05-17 DIAGNOSIS — K219 Gastro-esophageal reflux disease without esophagitis: Secondary | ICD-10-CM

## 2022-05-17 MED ORDER — OMEPRAZOLE 20 MG PO CPDR: DELAYED_RELEASE_CAPSULE | ORAL | 1 refills | Status: DC

## 2022-05-17 MED ORDER — VALSARTAN-HYDROCHLOROTHIAZIDE 160-25 MG PO TABS
ORAL_TABLET | ORAL | 1 refills | Status: DC
Start: 2022-05-17 — End: 2022-06-28

## 2022-05-17 NOTE — Telephone Encounter (Signed)
  Prescription Request  05/17/2022  Is this a "Controlled Substance" medicine? NO  Have you seen your PCP in the last 2 weeks? NO  If YES, route message to pool  -  If NO, patient needs to be scheduled for appointment.  What is the name of the medication or equipment? omeprazole (PRILOSEC) 20 MG capsule valsartan-hydrochlorothiazide (DIOVAN-HCT) 160-25 MG tablet  Have you contacted your pharmacy to request a refill? YES   Which [facility] you [facility]   Patient notified that their request is being sent to the clinical staff for review and that they should receive a response within 2 business days.

## 2022-05-17 NOTE — Telephone Encounter (Signed)
Pt aware refills sent to pharmacy till appt in July

## 2022-05-29 DIAGNOSIS — Z961 Presence of intraocular lens: Secondary | ICD-10-CM | POA: Diagnosis not present

## 2022-05-29 DIAGNOSIS — H401111 Primary open-angle glaucoma, right eye, mild stage: Secondary | ICD-10-CM | POA: Diagnosis not present

## 2022-05-29 DIAGNOSIS — E119 Type 2 diabetes mellitus without complications: Secondary | ICD-10-CM | POA: Diagnosis not present

## 2022-05-29 DIAGNOSIS — H401123 Primary open-angle glaucoma, left eye, severe stage: Secondary | ICD-10-CM | POA: Diagnosis not present

## 2022-05-29 DIAGNOSIS — H353131 Nonexudative age-related macular degeneration, bilateral, early dry stage: Secondary | ICD-10-CM | POA: Diagnosis not present

## 2022-05-29 DIAGNOSIS — H16223 Keratoconjunctivitis sicca, not specified as Sjogren's, bilateral: Secondary | ICD-10-CM | POA: Diagnosis not present

## 2022-06-06 ENCOUNTER — Encounter: Payer: Self-pay | Admitting: Family Medicine

## 2022-06-06 ENCOUNTER — Ambulatory Visit (INDEPENDENT_AMBULATORY_CARE_PROVIDER_SITE_OTHER): Payer: Medicare Other | Admitting: Family Medicine

## 2022-06-06 VITALS — BP 140/59 | HR 88 | Temp 97.8°F | Ht 59.0 in | Wt 207.0 lb

## 2022-06-06 DIAGNOSIS — R062 Wheezing: Secondary | ICD-10-CM

## 2022-06-06 DIAGNOSIS — Z8742 Personal history of other diseases of the female genital tract: Secondary | ICD-10-CM

## 2022-06-06 DIAGNOSIS — J069 Acute upper respiratory infection, unspecified: Secondary | ICD-10-CM | POA: Diagnosis not present

## 2022-06-06 MED ORDER — AMOXICILLIN-POT CLAVULANATE 875-125 MG PO TABS: 1.0000 | ORAL_TABLET | Freq: Two times a day (BID) | ORAL | 0 refills | Status: AC

## 2022-06-06 MED ORDER — FLUCONAZOLE 150 MG PO TABS: 150.0000 mg | ORAL_TABLET | Freq: Once | ORAL | 0 refills | Status: AC

## 2022-06-06 MED ORDER — PREDNISONE 20 MG PO TABS: ORAL_TABLET | ORAL | 0 refills | Status: DC

## 2022-06-06 NOTE — Progress Notes (Signed)
Subjective:  Patient ID: Robin Lindsey, female    DOB: 29-Jun-1946, 76 y.o.   MRN: [id number]  Patient Care Team: Sharion Balloon, FNP as PCP - General (Family Medicine) Herminio Commons, MD (Inactive) as PCP - Cardiology (Cardiology) Clent Jacks, MD as Consulting Physician (Ophthalmology) Steffanie Rainwater, DPM as Consulting Physician (Podiatry) Maisie Fus, MD as Consulting Physician (Obstetrics and Gynecology) Lavera Guise, [facility] as Pharmacist (Family Medicine)   Chief Complaint:  Sore Throat and chest congestion   HPI: Robin Lindsey is a 76 y.o. female presenting on 06/06/2022 for Sore Throat and chest congestion   URI  This is a new problem. The current episode started 1 to 4 weeks ago. The problem has been gradually worsening. Associated symptoms include congestion, coughing, ear pain, headaches, rhinorrhea, a sore throat, swollen glands and wheezing. Pertinent negatives include no abdominal pain, chest pain, diarrhea, dysuria, joint pain, joint swelling, nausea, neck pain, plugged ear sensation, rash, sinus pain, sneezing or vomiting. She has tried decongestant for the symptoms. The treatment provided no relief.   Relevant past medical, surgical, family, and social history reviewed and updated as indicated.  Allergies and medications reviewed and updated. Data reviewed: Chart in Epic.   Past Medical History:  Diagnosis Date   Arthritis    Cataract    Diabetes mellitus without complication ([place])    pt is not on diabetic medication   GERD (gastroesophageal reflux disease)    Glaucoma    worse in left eye than right eye   Heel pain    right heel pain   Hypertension    Osteoporosis    Personal history of colonic polyps 12/09/2012   Plantar fasciitis    right foot    Past Surgical History:  Procedure Laterality Date   BREAST LUMPECTOMY  10 + yrs.   left breast/benign   CATARACT EXTRACTION W/PHACO  02/14/2012   Procedure: CATARACT EXTRACTION PHACO AND  INTRAOCULAR LENS PLACEMENT ([place]);  Surgeon: Tonny Branch, MD;  Location: [facility];  Service: Ophthalmology;  Laterality: Left;  CDE 13.73   CATARACT EXTRACTION W/PHACO  03/03/2012   Procedure: CATARACT EXTRACTION PHACO AND INTRAOCULAR LENS PLACEMENT ([facility]);  Surgeon: Tonny Branch, MD;  Location: [facility];  Service: Ophthalmology;  Laterality: Right;  CDE: 16.49   COLONOSCOPY     percutaneous micrfasciotomy with topaz coblation wand  12/11/2015   rt foot surgery Right     Social History   Socioeconomic History   Marital status: Married    Spouse name: Louis   Number of children: 3   Years of education: 12   Highest education level: 12th grade  Occupational History   Occupation: retired  Tobacco Use   Smoking status: Former    Packs/day: 1.00    Years: 5.00    Total pack years: 5.00    Types: Cigarettes    Start date: 03/10/1962    Quit date: 03/11/1967    Years since quitting: 55.2   Smokeless tobacco: Never   Tobacco comments:    smoked for 4-5 years from age 53- 75 years old  Vaping Use   Vaping Use: Never used  Substance and Sexual Activity   Alcohol use: No    Alcohol/week: 0.0 standard drinks of alcohol   Drug use: No   Sexual activity: Not Currently  Other Topics Concern   Not on file  Social History Narrative   Not on file   Social Determinants of Health   Financial  Resource Strain: Low Risk  (09/07/2021)   Overall Financial Resource Strain (CARDIA)    Difficulty of Paying Living Expenses: Not hard at all  Food Insecurity: No Food Insecurity (09/07/2021)   Hunger Vital Sign    Worried About Running Out of Food in the Last Year: Never true    Ran Out of Food in the Last Year: Never true  Transportation Needs: No Transportation Needs (09/07/2021)   PRAPARE - Hydrologist (Medical): No    Lack of Transportation (Non-Medical): No  Physical Activity: Inactive (09/07/2021)   Exercise Vital Sign    Days of Exercise per Week: 0 days    Minutes of  Exercise per Session: 0 min  Stress: No Stress Concern Present (09/07/2021)   [place]    Feeling of Stress : Not at all  Social Connections: Moderately Integrated (09/07/2021)   Social Connection and Isolation Panel [NHANES]    Frequency of Communication with Friends and Family: More than three times a week    Frequency of Social Gatherings with Friends and Family: More than three times a week    Attends Religious Services: More than 4 times per year    Active Member of [employer] or Organizations: No    Attends Archivist Meetings: Never    Marital Status: Married  Human resources officer Violence: Not At Risk (09/07/2021)   Humiliation, Afraid, Rape, and Kick questionnaire    Fear of Current or Ex-Partner: No    Emotionally Abused: No    Physically Abused: No    Sexually Abused: No    Outpatient Encounter Medications as of 06/06/2022  Medication Sig   amoxicillin-clavulanate (AUGMENTIN) 875-125 MG tablet Take 1 tablet by mouth 2 (two) times daily for 10 days.   fluconazole (DIFLUCAN) 150 MG tablet Take 1 tablet (150 mg total) by mouth once for 1 dose.   LUMIGAN 0.01 % SOLN Place into both eyes daily.    omeprazole (PRILOSEC) 20 MG capsule TAKE ONE (1) CAPSULE BY MOUTH 2 TIMES DAILY   predniSONE (DELTASONE) 20 MG tablet 2 po at sametime daily for 5 days- start tomorrow   valsartan-hydrochlorothiazide (DIOVAN-HCT) 160-25 MG tablet TAKE ONE (1) TABLET EACH DAY   cholestyramine (QUESTRAN) 4 g packet Take 1 packet (4 g total) by mouth daily with supper. (Patient not taking: Reported on 06/06/2022)   No facility-administered encounter medications on file as of 06/06/2022.    Allergies  Allergen Reactions   Metformin And Related Nausea And Vomiting   Aspirin Nausea Only and Other (See Comments)    pain   Nsaids     Stomach pain    Review of Systems  Constitutional:  Positive for activity change, appetite change, chills  and fatigue. Negative for diaphoresis, fever and unexpected weight change.  HENT:  Positive for congestion, ear pain, postnasal drip, rhinorrhea and sore throat. Negative for dental problem, drooling, ear discharge, facial swelling, hearing loss, mouth sores, nosebleeds, sinus pressure, sinus pain, sneezing, tinnitus, trouble swallowing and voice change.   Eyes:  Negative for photophobia and visual disturbance.  Respiratory:  Positive for cough and wheezing. Negative for choking, chest tightness, shortness of breath and stridor.   Cardiovascular:  Negative for chest pain, palpitations and leg swelling.  Gastrointestinal:  Negative for abdominal distention, abdominal pain, diarrhea, nausea and vomiting.  Endocrine: Negative for cold intolerance.  Genitourinary:  Negative for decreased urine volume, difficulty urinating and dysuria.  Musculoskeletal:  Negative  for joint pain and neck pain.  Skin:  Negative for rash.  Neurological:  Positive for headaches. Negative for dizziness, tremors, weakness and light-headedness.  Psychiatric/Behavioral:  Negative for confusion.   All other systems reviewed and are negative.       Objective:  BP (!) 140/59   Pulse 88   Temp 97.8 F (36.6 C)   Ht '4\' 11"'$  (1.499 m)   Wt 207 lb (93.9 kg)   SpO2 96%   BMI 41.81 kg/m    Wt Readings from Last 3 Encounters:  06/06/22 207 lb (93.9 kg)  09/07/21 205 lb (93 kg)  07/21/21 205 lb (93 kg)    Physical Exam Vitals and nursing note reviewed.  Constitutional:      General: She is not in acute distress.    Appearance: Normal appearance. She is well-developed. She is obese. She is not ill-appearing, toxic-appearing or diaphoretic.  HENT:     Head: Normocephalic and atraumatic.     Right Ear: Ear canal and external ear normal. No drainage, swelling or tenderness. No middle ear effusion. Tympanic membrane is erythematous.     Left Ear: Tympanic membrane, ear canal and external ear normal. No drainage, swelling  or tenderness.  No middle ear effusion. Tympanic membrane is not erythematous.     Nose: Congestion present. No rhinorrhea.     Mouth/Throat:     Mouth: Mucous membranes are moist. No oral lesions.     Pharynx: Posterior oropharyngeal erythema present. No pharyngeal swelling, oropharyngeal exudate or uvula swelling.     Tonsils: No tonsillar exudate.  Eyes:     Conjunctiva/sclera: Conjunctivae normal.     Pupils: Pupils are equal, round, and reactive to light.  Cardiovascular:     Rate and Rhythm: Normal rate and regular rhythm.     Heart sounds: Normal heart sounds.  Pulmonary:     Effort: Pulmonary effort is normal. No respiratory distress.     Breath sounds: No stridor. Wheezing (expiratory, scattered) present. No rhonchi or rales.  Chest:     Chest wall: No tenderness.  Musculoskeletal:     Cervical back: Normal range of motion.  Lymphadenopathy:     Cervical: Cervical adenopathy present.  Skin:    General: Skin is warm and dry.     Capillary Refill: Capillary refill takes less than 2 seconds.  Neurological:     General: No focal deficit present.     Mental Status: She is alert and oriented to person, place, and time.  Psychiatric:        Mood and Affect: Mood normal.        Behavior: Behavior normal.        Thought Content: Thought content normal.        Judgment: Judgment normal.     Results for orders placed or performed in visit on 07/26/21  HM DIABETES EYE EXAM  Result Value Ref Range   HM Diabetic Eye Exam No Retinopathy No Retinopathy       Pertinent labs & imaging results that were available during my care of the patient were reviewed by me and considered in my medical decision making.  Assessment & Plan:  Vaughn was seen today for sore throat and chest congestion.  Diagnoses and all orders for this visit:  URI with cough and congestion Wheezing Ongoing symptoms despite symptomatic care at home. Will initiate antibiotic therapy. Due to wheezing, will  burst with steroids. Continue Mucinex with plenty of water. Report new, worsening, or persistent  symptoms.  -     amoxicillin-clavulanate (AUGMENTIN) 875-125 MG tablet; Take 1 tablet by mouth 2 (two) times daily for 10 days. -     predniSONE (DELTASONE) 20 MG tablet; 2 po at sametime daily for 5 days- start tomorrow  History of vaginitis History of vaginitis with antibiotic use, will send in flconazole, pt aware to use if she becomes symptomatic.  -     fluconazole (DIFLUCAN) 150 MG tablet; Take 1 tablet (150 mg total) by mouth once for 1 dose.     Continue all other maintenance medications.  Follow up plan: Return if symptoms worsen or fail to improve.   Continue healthy lifestyle choices, including diet (rich in fruits, vegetables, and lean proteins, and low in salt and simple carbohydrates) and exercise (at least 30 minutes of moderate physical activity daily).  Educational handout given for URI  The above assessment and management plan was discussed with the patient. The patient verbalized understanding of and has agreed to the management plan. Patient is aware to call the clinic if they develop any new symptoms or if symptoms persist or worsen. Patient is aware when to return to the clinic for a follow-up visit. Patient educated on when it is appropriate to go to the emergency department.   Monia Pouch, FNP-C [facility] [phone]

## 2022-06-28 ENCOUNTER — Ambulatory Visit (INDEPENDENT_AMBULATORY_CARE_PROVIDER_SITE_OTHER): Payer: Medicare Other | Admitting: Family

## 2022-06-28 ENCOUNTER — Encounter: Payer: Self-pay | Admitting: Family

## 2022-06-28 VITALS — BP 134/62 | HR 85 | Temp 97.7°F | Ht 59.0 in | Wt 208.6 lb

## 2022-06-28 DIAGNOSIS — E1169 Type 2 diabetes mellitus with other specified complication: Secondary | ICD-10-CM | POA: Diagnosis not present

## 2022-06-28 DIAGNOSIS — N3001 Acute cystitis with hematuria: Secondary | ICD-10-CM | POA: Diagnosis not present

## 2022-06-28 DIAGNOSIS — K219 Gastro-esophageal reflux disease without esophagitis: Secondary | ICD-10-CM | POA: Diagnosis not present

## 2022-06-28 DIAGNOSIS — I152 Hypertension secondary to endocrine disorders: Secondary | ICD-10-CM

## 2022-06-28 DIAGNOSIS — E1159 Type 2 diabetes mellitus with other circulatory complications: Secondary | ICD-10-CM | POA: Diagnosis not present

## 2022-06-28 DIAGNOSIS — T466X5A Adverse effect of antihyperlipidemic and antiarteriosclerotic drugs, initial encounter: Secondary | ICD-10-CM

## 2022-06-28 DIAGNOSIS — R300 Dysuria: Secondary | ICD-10-CM

## 2022-06-28 DIAGNOSIS — M791 Myalgia, unspecified site: Secondary | ICD-10-CM | POA: Diagnosis not present

## 2022-06-28 DIAGNOSIS — E559 Vitamin D deficiency, unspecified: Secondary | ICD-10-CM | POA: Diagnosis not present

## 2022-06-28 DIAGNOSIS — F411 Generalized anxiety disorder: Secondary | ICD-10-CM

## 2022-06-28 LAB — MICROSCOPIC EXAMINATION
RBC, Urine: NONE SEEN /hpf (ref 0–2)
Renal Epithel, UA: NONE SEEN /hpf

## 2022-06-28 LAB — URINALYSIS, COMPLETE
Bilirubin, UA: NEGATIVE
Glucose, UA: NEGATIVE
Nitrite, UA: NEGATIVE
Protein,UA: NEGATIVE
RBC, UA: NEGATIVE
Specific Gravity, UA: 1.015 (ref 1.005–1.030)
Urobilinogen, Ur: 0.2 mg/dL (ref 0.2–1.0)
pH, UA: 6 (ref 5.0–7.5)

## 2022-06-28 LAB — BAYER DCA HB A1C WAIVED: HB A1C (BAYER DCA - WAIVED): 6.2 % — ABNORMAL HIGH (ref 4.8–5.6)

## 2022-06-28 MED ORDER — VALSARTAN-HYDROCHLOROTHIAZIDE 160-25 MG PO TABS: ORAL_TABLET | ORAL | 1 refills | Status: DC

## 2022-06-28 MED ORDER — OMEPRAZOLE 20 MG PO CPDR: DELAYED_RELEASE_CAPSULE | ORAL | 1 refills | Status: DC

## 2022-06-28 MED ORDER — CEPHALEXIN 500 MG PO CAPS: 500.0000 mg | ORAL_CAPSULE | Freq: Two times a day (BID) | ORAL | 0 refills | Status: DC

## 2022-06-28 NOTE — Patient Instructions (Addendum)
Urinary Tract Infection, Adult  A urinary tract infection (UTI) is an infection of any part of the urinary tract. The urinary tract includes the kidneys, ureters, bladder, and urethra. These organs make, store, and get rid of urine in the body. An upper UTI affects the ureters and kidneys. A lower UTI affects the bladder and urethra. What are the causes? Most urinary tract infections are caused by bacteria in your genital area around your urethra, where urine leaves your body. These bacteria grow and cause inflammation of your urinary tract. What increases the risk? You are more likely to develop this condition if: You have a urinary catheter that stays in place. You are not able to control when you urinate or have a bowel movement (incontinence). You are female and you: Use a spermicide or diaphragm for birth control. Have low estrogen levels. Are pregnant. You have certain genes that increase your risk. You are sexually active. You take antibiotic medicines. You have a condition that causes your flow of urine to slow down, such as: An enlarged prostate, if you are female. Blockage in your urethra. A kidney stone. A nerve condition that affects your bladder control (neurogenic bladder). Not getting enough to drink, or not urinating often. You have certain medical conditions, such as: Diabetes. A weak disease-fighting system (immunesystem). Sickle cell disease. Gout. Spinal cord injury. What are the signs or symptoms? Symptoms of this condition include: Needing to urinate right away (urgency). Frequent urination. This may include small amounts of urine each time you urinate. Pain or burning with urination. Blood in the urine. Urine that smells bad or unusual. Trouble urinating. Cloudy urine. Vaginal discharge, if you are female. Pain in the abdomen or the lower back. You may also have: Vomiting or a decreased appetite. Confusion. Irritability or tiredness. A fever or  chills. Diarrhea. The first symptom in older adults may be confusion. In some cases, they may not have any symptoms until the infection has worsened. How is this diagnosed? This condition is diagnosed based on your medical history and a physical exam. You may also have other tests, including: Urine tests. Blood tests. Tests for STIs (sexually transmitted infections). If you have had more than one UTI, a cystoscopy or imaging studies may be done to determine the cause of the infections. How is this treated? Treatment for this condition includes: Antibiotic medicine. Over-the-counter medicines to treat discomfort. Drinking enough water to stay hydrated. If you have frequent infections or have other conditions such as a kidney stone, you may need to see a health care provider who specializes in the urinary tract (urologist). In rare cases, urinary tract infections can cause sepsis. Sepsis is a life-threatening condition that occurs when the body responds to an infection. Sepsis is treated in the hospital with IV antibiotics, fluids, and other medicines. Follow these instructions at home:  Medicines Take over-the-counter and prescription medicines only as told by your health care provider. If you were prescribed an antibiotic medicine, take it as told by your health care provider. Do not stop using the antibiotic even if you start to feel better. General instructions Make sure you: Empty your bladder often and completely. Do not hold urine for long periods of time. Empty your bladder after sex. Wipe from front to back after urinating or having a bowel movement if you are female. Use each tissue only one time when you wipe. Drink enough fluid to keep your urine pale yellow. Keep all follow-up visits. This is important. Contact a health   care provider if: Your symptoms do not get better after 1-2 days. Your symptoms go away and then return. Get help right away if: You have severe pain in  your back or your lower abdomen. You have a fever or chills. You have nausea or vomiting. Summary A urinary tract infection (UTI) is an infection of any part of the urinary tract, which includes the kidneys, ureters, bladder, and urethra. Most urinary tract infections are caused by bacteria in your genital area. Treatment for this condition often includes antibiotic medicines. If you were prescribed an antibiotic medicine, take it as told by your health care provider. Do not stop using the antibiotic even if you start to feel better. Keep all follow-up visits. This is important. This information is not intended to replace advice given to you by your health care provider. Make sure you discuss any questions you have with your health care provider. Document Revised: 07/08/2020 Document Reviewed: 07/08/2020 Elsevier Patient Education  2023 Elsevier Inc.  

## 2022-06-28 NOTE — Progress Notes (Signed)
Subjective:    Patient ID: Robin Lindsey, female    DOB: Mar 04, 1946, 76 y.o.   MRN: [id number]  Chief Complaint  Patient presents with   Medical Management of Chronic Issues   Pt presents to the office today for annual physical. She had COVID on 05/26/21 and states she still has SOB at times with this.   Complaining of dysuria.  Hypertension This is a chronic problem. The current episode started more than 1 year ago. The problem has been resolved since onset. The problem is controlled. Associated symptoms include anxiety, malaise/fatigue, peripheral edema ("some times in my ankles") and shortness of breath. Pertinent negatives include no blurred vision. Risk factors for coronary artery disease include dyslipidemia, diabetes mellitus, obesity and sedentary lifestyle. The current treatment provides moderate improvement.  Diabetes She presents for her follow-up diabetic visit. She has type 2 diabetes mellitus. Hypoglycemia symptoms include nervousness/anxiousness. Associated symptoms include foot paresthesias. Pertinent negatives for diabetes include no blurred vision. Symptoms are stable. Diabetic complications include peripheral neuropathy. Risk factors for coronary artery disease include dyslipidemia, diabetes mellitus, hypertension, sedentary lifestyle and post-menopausal. She is following a generally healthy diet. (Does not check BS at home)  Gastroesophageal Reflux She complains of belching and heartburn. She reports no nausea. This is a chronic problem. The current episode started more than 1 year ago. The problem occurs occasionally. The symptoms are aggravated by certain foods. Risk factors include obesity. She has tried a PPI for the symptoms. The treatment provided moderate relief.  Anxiety Presents for follow-up visit. Symptoms include depressed mood, excessive worry, nervous/anxious behavior and shortness of breath. Patient reports no nausea. Symptoms occur rarely. The severity of  symptoms is mild.    Dysuria  This is a new problem. The current episode started in the past 7 days. The problem has been gradually worsening. The quality of the pain is described as burning. The pain is at a severity of 8/10. The pain is mild. Associated symptoms include frequency and urgency. Pertinent negatives include no hematuria, nausea or vomiting. She has tried increased fluids for the symptoms. The treatment provided no relief.      Review of Systems  Constitutional:  Positive for malaise/fatigue.  Eyes:  Negative for blurred vision.  Respiratory:  Positive for shortness of breath.   Gastrointestinal:  Positive for heartburn. Negative for nausea and vomiting.  Genitourinary:  Positive for dysuria, frequency and urgency. Negative for hematuria.  Psychiatric/Behavioral:  The patient is nervous/anxious.   All other systems reviewed and are negative.      Objective:   Physical Exam Vitals reviewed.  Constitutional:      General: She is not in acute distress.    Appearance: She is well-developed.  HENT:     Head: Normocephalic and atraumatic.     Right Ear: External ear normal.  Eyes:     Pupils: Pupils are equal, round, and reactive to light.  Neck:     Thyroid: No thyromegaly.  Cardiovascular:     Rate and Rhythm: Normal rate and regular rhythm.     Heart sounds: Normal heart sounds. No murmur heard. Pulmonary:     Effort: Pulmonary effort is normal. No respiratory distress.     Breath sounds: Normal breath sounds. No wheezing.  Abdominal:     General: Bowel sounds are normal. There is no distension.     Palpations: Abdomen is soft.     Tenderness: There is no abdominal tenderness.  Musculoskeletal:  General: No tenderness. Normal range of motion.     Cervical back: Normal range of motion and neck supple.  Skin:    General: Skin is warm and dry.  Neurological:     Mental Status: She is alert and oriented to person, place, and time.     Cranial Nerves: No  cranial nerve deficit.     Deep Tendon Reflexes: Reflexes are normal and symmetric.  Psychiatric:        Behavior: Behavior normal.        Thought Content: Thought content normal.        Judgment: Judgment normal.        BP 134/62   Pulse 85   Temp 97.7 F (36.5 C)   Ht 4' 11"  (1.499 m)   Wt 208 lb 9.6 oz (94.6 kg)   SpO2 98%   BMI 42.13 kg/m   Assessment & Plan:  Robin Lindsey comes in today with chief complaint of Medical Management of Chronic Issues   Diagnosis and orders addressed:  1. Dysuria - Urinalysis, Complete - CMP14+EGFR - CBC with Differential/Platelet  2. Gastroesophageal reflux disease without esophagitis - omeprazole (PRILOSEC) 20 MG capsule; TAKE ONE (1) CAPSULE BY MOUTH 2 TIMES DAILY  Dispense: 60 capsule; Refill: 1 - CMP14+EGFR - CBC with Differential/Platelet  3. Hypertension associated with diabetes ([place]) - CMP14+EGFR - CBC with Differential/Platelet  4. Type 2 diabetes mellitus with other specified complication, without long-term current use of insulin ([facility]) - Bayer DCA Hb A1c Waived - CMP14+EGFR - CBC with Differential/Platelet - Lipid panel - Microalbumin / creatinine urine ratio  5. Morbid obesity ([place]) - CMP14+EGFR - CBC with Differential/Platelet  6. GAD (generalized anxiety disorder) - CMP14+EGFR - CBC with Differential/Platelet  7. Vitamin D deficiency - CMP14+EGFR - CBC with Differential/Platelet  8. Myalgia due to statin - CMP14+EGFR - CBC with Differential/Platelet  9. Acute cystitis with hematuria Force fluids AZO over the counter X2 days RTO prn Culture pending - CMP14+EGFR - CBC with Differential/Platelet - cephALEXin (KEFLEX) 500 MG capsule; Take 1 capsule (500 mg total) by mouth 2 (two) times daily.  Dispense: 14 capsule; Refill: 0   Labs pending Health Maintenance reviewed Diet and exercise encouraged  Follow up plan: 6 months    Evelina Dun, FNP

## 2022-06-29 LAB — CMP14+EGFR
ALT: 10 IU/L (ref 0–32)
AST: 13 IU/L (ref 0–40)
Albumin/Globulin Ratio: 1.7 (ref 1.2–2.2)
Albumin: 3.9 g/dL (ref 3.8–4.8)
Alkaline Phosphatase: 61 IU/L (ref 44–121)
BUN/Creatinine Ratio: 15 (ref 12–28)
BUN: 17 mg/dL (ref 8–27)
Bilirubin Total: 0.2 mg/dL (ref 0.0–1.2)
CO2: 25 mmol/L (ref 20–29)
Calcium: 9 mg/dL (ref 8.7–10.3)
Chloride: 101 mmol/L (ref 96–106)
Creatinine, Ser: 1.15 mg/dL — ABNORMAL HIGH (ref 0.57–1.00)
Globulin, Total: 2.3 g/dL (ref 1.5–4.5)
Glucose: 126 mg/dL — ABNORMAL HIGH (ref 70–99)
Potassium: 4.2 mmol/L (ref 3.5–5.2)
Sodium: 140 mmol/L (ref 134–144)
Total Protein: 6.2 g/dL (ref 6.0–8.5)
eGFR: 49 mL/min/{1.73_m2} — ABNORMAL LOW (ref >59)

## 2022-06-29 LAB — CBC WITH DIFFERENTIAL/PLATELET
Basophils Absolute: 0 10*3/uL (ref 0.0–0.2)
Basos: 0 %
EOS (ABSOLUTE): 0.2 10*3/uL (ref 0.0–0.4)
Eos: 2 %
Hematocrit: 38.9 % (ref 34.0–46.6)
Hemoglobin: 12.8 g/dL (ref 11.1–15.9)
Immature Grans (Abs): 0 10*3/uL (ref 0.0–0.1)
Immature Granulocytes: 0 %
Lymphocytes Absolute: 2.7 10*3/uL (ref 0.7–3.1)
Lymphs: 29 %
MCH: 31.1 pg (ref 26.6–33.0)
MCHC: 32.9 g/dL (ref 31.5–35.7)
MCV: 95 fL (ref 79–97)
Monocytes Absolute: 0.7 10*3/uL (ref 0.1–0.9)
Monocytes: 8 %
Neutrophils Absolute: 5.4 10*3/uL (ref 1.4–7.0)
Neutrophils: 61 %
Platelets: 241 10*3/uL (ref 150–450)
RBC: 4.11 x10E6/uL (ref 3.77–5.28)
RDW: 12.8 % (ref 11.7–15.4)
WBC: 9 10*3/uL (ref 3.4–10.8)

## 2022-06-29 LAB — LIPID PANEL
Chol/HDL Ratio: 2.9 ratio (ref 0.0–4.4)
Cholesterol, Total: 191 mg/dL (ref 100–199)
HDL: 65 mg/dL (ref >39)
LDL Chol Calc (NIH): 100 mg/dL — ABNORMAL HIGH (ref 0–99)
Triglycerides: 152 mg/dL — ABNORMAL HIGH (ref 0–149)
VLDL Cholesterol Cal: 26 mg/dL (ref 5–40)

## 2022-06-29 LAB — MICROALBUMIN / CREATININE URINE RATIO
Creatinine, Urine: 164 mg/dL
Microalb/Creat Ratio: 29 mg/g creat (ref 0–29)
Microalbumin, Urine: 48 ug/mL

## 2022-07-05 LAB — URINE CULTURE

## 2022-09-20 ENCOUNTER — Other Ambulatory Visit: Payer: Self-pay | Admitting: Family

## 2022-09-20 NOTE — Telephone Encounter (Signed)
Patient aware Robin Lindsey will work on once she gets back

## 2022-09-20 NOTE — Telephone Encounter (Signed)
Pt called stating that she is due for her Prolia on 09/26/22 and has not heard from anyone about getting it.  Also says that her insurance did not cover it the last time she got it because insurance told her that we didn't do a PA for it.  Explained to pt that per message from our office manager, she was told that the Prolia did not require a PA.  Pt said she was told that it shouldn't cost her anything.  Please advise and call patient.

## 2022-10-03 NOTE — Telephone Encounter (Signed)
Pt called back about this message. I informed pt per lead that we are waiting for approval from the insurance.

## 2022-10-04 ENCOUNTER — Ambulatory Visit (INDEPENDENT_AMBULATORY_CARE_PROVIDER_SITE_OTHER): Payer: Medicare Other | Admitting: *Deleted

## 2022-10-04 DIAGNOSIS — M810 Age-related osteoporosis without current pathological fracture: Secondary | ICD-10-CM

## 2022-10-04 MED ORDER — DENOSUMAB 60 MG/ML ~~LOC~~ SOSY
60.0000 mg | PREFILLED_SYRINGE | Freq: Once | SUBCUTANEOUS | Status: AC
  Administered 2022-10-04: 60 mg via SUBCUTANEOUS

## 2022-10-05 DIAGNOSIS — H353131 Nonexudative age-related macular degeneration, bilateral, early dry stage: Secondary | ICD-10-CM | POA: Diagnosis not present

## 2022-10-05 DIAGNOSIS — Z961 Presence of intraocular lens: Secondary | ICD-10-CM | POA: Diagnosis not present

## 2022-10-05 DIAGNOSIS — E119 Type 2 diabetes mellitus without complications: Secondary | ICD-10-CM | POA: Diagnosis not present

## 2022-10-05 DIAGNOSIS — H401111 Primary open-angle glaucoma, right eye, mild stage: Secondary | ICD-10-CM | POA: Diagnosis not present

## 2022-10-05 DIAGNOSIS — H401123 Primary open-angle glaucoma, left eye, severe stage: Secondary | ICD-10-CM | POA: Diagnosis not present

## 2022-10-05 LAB — HM DIABETES EYE EXAM

## 2022-10-09 NOTE — Telephone Encounter (Signed)
Pt received Prolia

## 2022-11-16 ENCOUNTER — Ambulatory Visit (INDEPENDENT_AMBULATORY_CARE_PROVIDER_SITE_OTHER): Payer: Medicare Other

## 2022-11-16 DIAGNOSIS — Z23 Encounter for immunization: Secondary | ICD-10-CM | POA: Diagnosis not present

## 2022-11-22 ENCOUNTER — Telehealth: Payer: Self-pay | Admitting: Family

## 2022-11-22 NOTE — Telephone Encounter (Signed)
Left message for patient to call back and schedule Medicare Annual Wellness Visit (AWV) to be completed by video or phone.   Last AWV: 09/07/2021  Please schedule at anytime with [place]     45 minute appointment  Any questions, please contact me at [phone]

## 2022-11-26 ENCOUNTER — Ambulatory Visit (INDEPENDENT_AMBULATORY_CARE_PROVIDER_SITE_OTHER): Payer: Medicare Other

## 2022-11-26 VITALS — Ht 59.0 in | Wt 205.0 lb

## 2022-11-26 DIAGNOSIS — Z0000 Encounter for general adult medical examination without abnormal findings: Secondary | ICD-10-CM

## 2022-11-26 NOTE — Progress Notes (Signed)
Subjective:   Robin Lindsey is a 76 y.o. female who presents for Medicare Annual (Subsequent) preventive examination.  I connected with  Robin Lindsey on 11/26/22 by a audio enabled telemedicine application and verified that I am speaking with the correct person using two identifiers.  Patient Location: Home  Provider Location: Home Office  I discussed the limitations of evaluation and management by telemedicine. The patient expressed understanding and agreed to proceed.  Review of Systems     Cardiac Risk Factors include: advanced age (>58mn, >>89women);hypertension     Objective:    Today's Vitals   11/26/22 1448  Weight: 205 lb (93 kg)  Height: _0  (1.499 m)   Body mass index is 41.4 kg/m.     11/26/2022    2:36 PM 09/07/2021    2:37 PM 08/19/2019   10:22 AM 08/14/2018    2:57 PM 05/21/2016   11:28 AM 04/04/2016   12:55 PM 02/11/2012   10:42 AM  Advanced Directives  Does Patient Have a Medical Advance Directive? Yes No No No No Yes Patient does not have advance directive;Patient would not like information  Type of Advance Directive Living will;Healthcare Power of [place]  Does patient want to make changes to medical advance directive? No - Patient declined        Copy of [place]? No - copy requested        Would patient like information on creating a medical advance directive?  No - Patient declined No - Patient declined No - Patient declined Yes - Educational materials given      Current Medications (verified) Outpatient Encounter Medications as of 11/26/2022  Medication Sig   LUMIGAN 0.01 % SOLN Place into both eyes daily.    omeprazole (PRILOSEC) 20 MG capsule TAKE ONE (1) CAPSULE BY MOUTH 2 TIMES DAILY   valsartan-hydrochlorothiazide (DIOVAN-HCT) 160-25 MG tablet TAKE ONE (1) TABLET EACH DAY   cephALEXin (KEFLEX) 500 MG capsule Take 1 capsule (500 mg total) by mouth 2 (two) times daily. (Patient  not taking: Reported on 11/26/2022)   cholestyramine (QUESTRAN) 4 g packet Take 1 packet (4 g total) by mouth daily with supper. (Patient not taking: Reported on 06/06/2022)   No facility-administered encounter medications on file as of 11/26/2022.    Allergies (verified) Metformin and related, Aspirin, Nsaids, and Statins   History: Past Medical History:  Diagnosis Date   Arthritis    Cataract    Diabetes mellitus without complication ([place]    pt is not on diabetic medication   GERD (gastroesophageal reflux disease)    Glaucoma    worse in left eye than right eye   Heel pain    right heel pain   Hypertension    Osteoporosis    Personal history of colonic polyps 12/09/2012   Plantar fasciitis    right foot   Past Surgical History:  Procedure Laterality Date   BREAST LUMPECTOMY  10 + yrs.   left breast/benign   CATARACT EXTRACTION W/PHACO  02/14/2012   Procedure: CATARACT EXTRACTION PHACO AND INTRAOCULAR LENS PLACEMENT ([place];  Surgeon: KTonny Branch MD;  Location: [facility];  Service: Ophthalmology;  Laterality: Left;  CDE 13.73   CATARACT EXTRACTION W/PHACO  03/03/2012   Procedure: CATARACT EXTRACTION PHACO AND INTRAOCULAR LENS PLACEMENT ([facility]);  Surgeon: KTonny Branch MD;  Location: [facility];  Service: Ophthalmology;  Laterality: Right;  CDE: 16.49   COLONOSCOPY  percutaneous micrfasciotomy with topaz coblation wand  12/11/2015   rt foot surgery Right    Family History  Problem Relation Age of Onset   Colon polyps Mother    Diabetes Mother    Pulmonary fibrosis Mother    Colon cancer Maternal Aunt    Pulmonary fibrosis Maternal Aunt    Heart disease Father    Stroke Father    Dementia Father    Lung disease Father        realted to asbestos exposure   Thyroid disease Brother    Diabetes Brother    Hypertension Brother    Meniere's disease Brother    Pulmonary fibrosis Maternal Aunt    Anesthesia problems Neg Hx    Hypotension Neg Hx    Malignant hyperthermia Neg Hx     Pseudochol deficiency Neg Hx    Social History   Socioeconomic History   Marital status: Married    Spouse name: Louis   Number of children: 3   Years of education: 12   Highest education level: 12th grade  Occupational History   Occupation: retired  Tobacco Use   Smoking status: Former    Packs/day: 1.00    Years: 5.00    Total pack years: 5.00    Types: Cigarettes    Start date: 03/10/1962    Quit date: 03/11/1967    Years since quitting: 55.7   Smokeless tobacco: Never   Tobacco comments:    smoked for 4-5 years from age 76- 57 years old  Vaping Use   Vaping Use: Never used  Substance and Sexual Activity   Alcohol use: No    Alcohol/week: 0.0 standard drinks of alcohol   Drug use: No   Sexual activity: Not Currently  Other Topics Concern   Not on file  Social History Narrative   Not on file   Social Determinants of Health   Financial Resource Strain: Low Risk  (11/26/2022)   Overall Financial Resource Strain (CARDIA)    Difficulty of Paying Living Expenses: Not hard at all  Food Insecurity: No Food Insecurity (11/26/2022)   Hunger Vital Sign    Worried About Running Out of Food in the Last Year: Never true    Ran Out of Food in the Last Year: Never true  Transportation Needs: No Transportation Needs (11/26/2022)   PRAPARE - Hydrologist (Medical): No    Lack of Transportation (Non-Medical): No  Physical Activity: Insufficiently Active (11/26/2022)   Exercise Vital Sign    Days of Exercise per Week: 3 days    Minutes of Exercise per Session: 30 min  Stress: No Stress Concern Present (11/26/2022)   [place]    Feeling of Stress : Not at all  Social Connections: Moderately Integrated (11/26/2022)   Social Connection and Isolation Panel [NHANES]    Frequency of Communication with Friends and Family: More than three times a week    Frequency of Social Gatherings with  Friends and Family: Three times a week    Attends Religious Services: More than 4 times per year    Active Member of Clubs or Organizations: No    Attends Archivist Meetings: Never    Marital Status: Married    Tobacco Counseling Counseling given: Not Answered Tobacco comments: smoked for 4-5 years from age 69- 30 years old   Clinical Intake:  Pre-visit preparation completed: Yes  Pain : No/denies pain  Diabetes: No  How often do you need to have someone help you when you read instructions, pamphlets, or other written materials from your doctor or pharmacy?: 1 - Never  Diabetic?No   Interpreter Needed?: No  Information entered by :: Denman George LPN   Activities of Daily Living    11/26/2022    2:36 PM  In your present state of health, do you have any difficulty performing the following activities:  Hearing? 0  Vision? 0  Difficulty concentrating or making decisions? 0  Walking or climbing stairs? 0  Dressing or bathing? 0  Doing errands, shopping? 0  Preparing Food and eating ? N  Using the Toilet? N  In the past six months, have you accidently leaked urine? N  Do you have problems with loss of bowel control? N  Managing your Medications? N  Managing your Finances? N  Housekeeping or managing your Housekeeping? N    Patient Care Team: Sharion Balloon, FNP as PCP - General (Family Medicine) Herminio Commons, MD (Inactive) as PCP - Cardiology (Cardiology) Clent Jacks, MD as Consulting Physician (Ophthalmology) Steffanie Rainwater, DPM as Consulting Physician (Podiatry) Maisie Fus, MD (Inactive) as Consulting Physician (Obstetrics and Gynecology) Lavera Guise, [facility] as Pharmacist (Family Medicine)  Indicate any recent Medical Services you may have received from other than [facility] providers in the past year (date may be approximate).     Assessment:   This is a routine wellness examination for Ad[place]en.  Hearing/Vision screen No results  found.  Dietary issues and exercise activities discussed: Current Exercise Habits: Home exercise routine;Structured exercise class, Type of exercise: walking;yoga, Time (Minutes): 30, Frequency (Times/Week): 4, Weekly Exercise (Minutes/Week): 120, Intensity: Mild   Goals Addressed   None   Depression Screen    11/26/2022    2:34 PM 06/28/2022    2:40 PM 06/06/2022   11:39 AM 09/07/2021    2:36 PM 06/24/2020   11:19 AM 06/08/2020   12:15 PM 11/12/2019   12:08 PM  PHQ 2/9 Scores  PHQ - 2 Score 0 0 0 0 0 0 0    Fall Risk    06/28/2022    2:40 PM 09/07/2021    2:38 PM 07/13/2021    3:37 PM 06/24/2020   11:19 AM 06/08/2020   12:14 PM  [place] in the past year? 0 0 0 0 0  Number falls in past yr:  0     Injury with Fall?  0     Risk for fall due to :  No Fall Risks No Fall Risks    Follow up  Falls prevention discussed Education provided      FALL RISK PREVENTION PERTAINING TO THE HOME:  Any stairs in or around the home? No  If so, are there any without handrails? No  Home free of loose throw rugs in walkways, pet beds, electrical cords, etc? Yes  Adequate lighting in your home to reduce risk of falls? Yes   ASSISTIVE DEVICES UTILIZED TO PREVENT FALLS:  Life alert? No  Use of a cane, walker or w/c? No  Grab bars in the bathroom? Yes  Shower chair or bench in shower? No  Elevated toilet seat or a handicapped toilet? Yes   TIMED UP AND GO:  Was the test performed? No . Telephonic visit   Cognitive Function:    08/14/2018    2:53 PM 05/21/2016   11:34 AM  MMSE - Mini Mental State Exam  Orientation to time 5  5  Orientation to Place 5 5  Registration 3 3  Attention/ Calculation 5 5  Recall 3 2  Language- name 2 objects 2 2  Language- repeat 1 1  Language- follow 3 step command 3 3  Language- read & follow direction 1 1  Write a sentence 1 1  Copy design 1 1  Total score 30 29        11/26/2022    2:36 PM 08/19/2019   10:25 AM  6CIT Screen  What Year? 0  points 0 points  What month? 0 points 0 points  What time? 0 points 0 points  Count back from 20 0 points 0 points  Months in reverse 0 points 0 points  Repeat phrase 0 points 0 points  Total Score 0 points 0 points    Immunizations Immunization History  Administered Date(s) Administered   Fluad Quad(high Dose 65+) 10/01/2019, 10/12/2020, 11/14/2021, 11/16/2022   Influenza, High Dose Seasonal PF 10/01/2016, 09/04/2017, 10/23/2018   Influenza,inj,Quad PF,6+ Mos 10/06/2013, 09/22/2014   Influenza-Unspecified 10/10/2015   Pneumococcal Conjugate-13 10/06/2013   Pneumococcal Polysaccharide-23 02/16/2016   Unspecified SARS-COV-2 Vaccination 02/08/2020, 03/10/2020   Zoster Recombinat (Shingrix) 07/13/2021   Zoster, Live 04/21/2014    TDAP status: Due, Education has been provided regarding the importance of this vaccine. Advised may receive this vaccine at local pharmacy or Health Dept. Aware to provide a copy of the vaccination record if obtained from local pharmacy or Health Dept. Verbalized acceptance and understanding.  Flu Vaccine status: Up to date  Pneumococcal vaccine status: Up to date  Covid-19 vaccine status: Information provided on how to obtain vaccines.   Qualifies for Shingles Vaccine? Yes   Zostavax completed No   Shingrix Completed?: No.    Education has been provided regarding the importance of this vaccine. Patient has been advised to call insurance company to determine out of pocket expense if they have not yet received this vaccine. Advised may also receive vaccine at local pharmacy or Health Dept. Verbalized acceptance and understanding.  Screening Tests Health Maintenance  Topic Date Due   DTaP/Tdap/Td (1 - Tdap) Never done   Zoster Vaccines- Shingrix (2 of 2) 09/07/2021   FOOT EXAM  07/13/2022   COVID-19 Vaccine (3 - 2023-24 season) 08/10/2022   HEMOGLOBIN A1C  12/29/2022   Diabetic kidney evaluation - eGFR measurement  06/29/2023   Diabetic kidney  evaluation - Urine ACR  06/29/2023   DEXA SCAN  07/15/2023   OPHTHALMOLOGY EXAM  10/06/2023   Medicare Annual Wellness (AWV)  11/27/2023   COLONOSCOPY (Pts 45-33yr Insurance coverage will need to be confirmed)  07/21/2026   Pneumonia Vaccine 76 Years old  Completed   INFLUENZA VACCINE  Completed   Hepatitis C Screening  Completed   HPV VACCINES  Aged Out    Health Maintenance  Health Maintenance Due  Topic Date Due   DTaP/Tdap/Td (1 - Tdap) Never done   Zoster Vaccines- Shingrix (2 of 2) 09/07/2021   FOOT EXAM  07/13/2022   COVID-19 Vaccine (3 - 2023-24 season) 08/10/2022    Colorectal cancer screening: Type of screening: Colonoscopy. Completed 07/21/21. Repeat every 5 years  Mammogram status: No longer required due to age .  Bone Density status: Completed 07/14/21. Results reflect: Bone density results: OSTEOPOROSIS. Repeat every 2 years.  Lung Cancer Screening: (Low Dose CT Chest recommended if Age 78-80years, 30 pack-year currently smoking OR have quit w/in 15years.) does not qualify.   Lung Cancer Screening Referral: n/a   Additional  Screening:  Hepatitis C Screening: does qualify; Completed 02/16/16  Vision Screening: Recommended annual ophthalmology exams for early detection of glaucoma and other disorders of the eye. Is the patient up to date with their annual eye exam?  Yes  Who is the provider or what is the name of the office in which the patient attends annual eye exams? [facility]  If pt is not established with a provider, would they like to be referred to a provider to establish care? No .   Dental Screening: Recommended annual dental exams for proper oral hygiene  Community Resource Referral / Chronic Care Management: CRR required this visit?  No   CCM required this visit?  No      Plan:     I have personally reviewed and noted the following in the patient's chart:   Medical and social history Use of alcohol, tobacco or illicit drugs   Current medications and supplements including opioid prescriptions. Patient is not currently taking opioid prescriptions. Functional ability and status Nutritional status Physical activity Advanced directives List of other physicians Hospitalizations, surgeries, and ER visits in previous 12 months Vitals Screenings to include cognitive, depression, and falls Referrals and appointments  In addition, I have reviewed and discussed with patient certain preventive protocols, quality metrics, and best practice recommendations. A written personalized care plan for preventive services as well as general preventive health recommendations were provided to patient.     Vanetta Mulders, [place]   29/92/[id number]   Due to this being a virtual visit, the after visit summary with patients personalized plan was offered to patient via mail or my-chart.Patient would like to access on my-chart  Nurse Notes: Patient with concerns that Prolia may be cause joint pain.  Will route concern as telephone call.

## 2022-11-26 NOTE — Patient Instructions (Signed)
Robin Lindsey , Thank you for taking time to come for your Medicare Wellness Visit. I appreciate your ongoing commitment to your health goals. Please review the following plan we discussed and let me know if I can assist you in the future.   These are the goals we discussed:  Goals      Chronic Disease Management Needs     CARE PLAN ENTRY (see longtitudinal plan of care for additional care plan information)  Current Barriers:  Chronic Disease Management support, education, and care coordination needs related to HTN, DM, arthritis, osteopenia, glaucoma, GAD  Clinical Goal(s) related to HTN, DM, arthritis, osteopenia, glaucoma, GAD:  Over the next 60 days, patient will:  Work with the care management team to address educational, disease management, and care coordination needs  Begin or continue self health monitoring activities as directed today Measure and record cbg (blood glucose) 3 times weekly and Measure and record blood pressure 3 times per week Call provider office for new or worsened signs and symptoms Blood glucose findings outside established parameters and Blood pressure findings outside established parameters Call care management team with questions or concerns Verbalize basic understanding of patient centered plan of care established today  Interventions related to HTN, DM, arthritis, osteopenia, glaucoma, GAD:  Evaluation of current treatment plans and patient's adherence to plan as established by provider Assessed patient understanding of disease states Assessed patient's education and care coordination needs Provided disease specific education to patient  Collaborated with appropriate clinical care team members regarding patient need Reviewed upcoming appts: PCP 06/24/20 Chart reviewed including recent office notes and lab results Discussed recent lab results. A1C to be done at next visit. Discussed diabetes management Does not check blood sugar regularly but has  glucometer Does not take medication for diabetes. Diet regulated.  Discussed HTN management Has blood pressure monitor. Checks sporadically.  Has had some lower extremity edema and HCTZ was added at last PCP visit Discussed glaucoma management Sees ophthalmologist every 3 months Discussed physical activity level Provided with RN Care Manager telephone number and encouraged patient to reach out as needed Appointment with RNCM scheduled for 08/23/20  Patient Self Care Activities related to HTN, DM, arthritis, osteopenia, glaucoma, GAD:  Patient is able to independently perform ADLs and IADLs   Initial goal documentation      DIET - INCREASE WATER INTAKE     Try to drink 6-8 glasses of water daily.     Exercise 150 min/wk Moderate Activity        This is a list of the screening recommended for you and due dates:  Health Maintenance  Topic Date Due   DTaP/Tdap/Td vaccine (1 - Tdap) Never done   Zoster (Shingles) Vaccine (2 of 2) 09/07/2021   Complete foot exam   07/13/2022   COVID-19 Vaccine (3 - 2023-24 season) 08/10/2022   Hemoglobin A1C  12/29/2022   Yearly kidney function blood test for diabetes  06/29/2023   Yearly kidney health urinalysis for diabetes  06/29/2023   DEXA scan (bone density measurement)  07/15/2023   Eye exam for diabetics  10/06/2023   Medicare Annual Wellness Visit  11/27/2023   Colon Cancer Screening  07/21/2026   Pneumonia Vaccine  Completed   Flu Shot  Completed   Hepatitis C Screening: USPSTF Recommendation to screen - Ages 18-79 yo.  Completed   HPV Vaccine  Aged Out    Advanced directives: Please bring a copy of your health care power of attorney and living will to  the office to be added to your chart at your convenience.   Conditions/risks identified: Aim for 30 minutes of exercise or brisk walking, 6-8 glasses of water, and 5 servings of fruits and vegetables each day.   Next appointment: Follow up in one year for your annual wellness visit     Preventive Care 65 Years and Older, Female Preventive care refers to lifestyle choices and visits with your health care provider that can promote health and wellness. What does preventive care include? A yearly physical exam. This is also called an annual well check. Dental exams once or twice a year. Routine eye exams. Ask your health care provider how often you should have your eyes checked. Personal lifestyle choices, including: Daily care of your teeth and gums. Regular physical activity. Eating a healthy diet. Avoiding tobacco and drug use. Limiting alcohol use. Practicing safe sex. Taking low-dose aspirin every day. Taking vitamin and mineral supplements as recommended by your health care provider. What happens during an annual well check? The services and screenings done by your health care provider during your annual well check will depend on your age, overall health, lifestyle risk factors, and family history of disease. Counseling  Your health care provider may ask you questions about your: Alcohol use. Tobacco use. Drug use. Emotional well-being. Home and relationship well-being. Sexual activity. Eating habits. History of falls. Memory and ability to understand (cognition). Work and work Statistician. Reproductive health. Screening  You may have the following tests or measurements: Height, weight, and BMI. Blood pressure. Lipid and cholesterol levels. These may be checked every 5 years, or more frequently if you are over 50 years old. Skin check. Lung cancer screening. You may have this screening every year starting at age 37 if you have a 30-pack-year history of smoking and currently smoke or have quit within the past 15 years. Fecal occult blood test (FOBT) of the stool. You may have this test every year starting at age 59. Flexible sigmoidoscopy or colonoscopy. You may have a sigmoidoscopy every 5 years or a colonoscopy every 10 years starting at age  9. Hepatitis C blood test. Hepatitis B blood test. Sexually transmitted disease (STD) testing. Diabetes screening. This is done by checking your blood sugar (glucose) after you have not eaten for a while (fasting). You may have this done every 1-3 years. Bone density scan. This is done to screen for osteoporosis. You may have this done starting at age 33. Mammogram. This may be done every 1-2 years. Talk to your health care provider about how often you should have regular mammograms. Talk with your health care provider about your test results, treatment options, and if necessary, the need for more tests. Vaccines  Your health care provider may recommend certain vaccines, such as: Influenza vaccine. This is recommended every year. Tetanus, diphtheria, and acellular pertussis (Tdap, Td) vaccine. You may need a Td booster every 10 years. Zoster vaccine. You may need this after age 5. Pneumococcal 13-valent conjugate (PCV13) vaccine. One dose is recommended after age 50. Pneumococcal polysaccharide (PPSV23) vaccine. One dose is recommended after age 34. Talk to your health care provider about which screenings and vaccines you need and how often you need them. This information is not intended to replace advice given to you by your health care provider. Make sure you discuss any questions you have with your health care provider. Document Released: 12/23/2015 Document Revised: 08/15/2016 Document Reviewed: 09/27/2015 Elsevier Interactive Patient Education  2017 [employer] Prevention in  the Home Falls can cause injuries. They can happen to people of all ages. There are many things you can do to make your home safe and to help prevent falls. What can I do on the outside of my home? Regularly fix the edges of walkways and driveways and fix any cracks. Remove anything that might make you trip as you walk through a door, such as a raised step or threshold. Trim any bushes or trees on the  path to your home. Use bright outdoor lighting. Clear any walking paths of anything that might make someone trip, such as rocks or tools. Regularly check to see if handrails are loose or broken. Make sure that both sides of any steps have handrails. Any raised decks and porches should have guardrails on the edges. Have any leaves, snow, or ice cleared regularly. Use sand or salt on walking paths during winter. Clean up any spills in your garage right away. This includes oil or grease spills. What can I do in the bathroom? Use night lights. Install grab bars by the toilet and in the tub and shower. Do not use towel bars as grab bars. Use non-skid mats or decals in the tub or shower. If you need to sit down in the shower, use a plastic, non-slip stool. Keep the floor dry. Clean up any water that spills on the floor as soon as it happens. Remove soap buildup in the tub or shower regularly. Attach bath mats securely with double-sided non-slip rug tape. Do not have throw rugs and other things on the floor that can make you trip. What can I do in the bedroom? Use night lights. Make sure that you have a light by your bed that is easy to reach. Do not use any sheets or blankets that are too big for your bed. They should not hang down onto the floor. Have a firm chair that has side arms. You can use this for support while you get dressed. Do not have throw rugs and other things on the floor that can make you trip. What can I do in the kitchen? Clean up any spills right away. Avoid walking on wet floors. Keep items that you use a lot in easy-to-reach places. If you need to reach something above you, use a strong step stool that has a grab bar. Keep electrical cords out of the way. Do not use floor polish or wax that makes floors slippery. If you must use wax, use non-skid floor wax. Do not have throw rugs and other things on the floor that can make you trip. What can I do with my stairs? Do not  leave any items on the stairs. Make sure that there are handrails on both sides of the stairs and use them. Fix handrails that are broken or loose. Make sure that handrails are as long as the stairways. Check any carpeting to make sure that it is firmly attached to the stairs. Fix any carpet that is loose or worn. Avoid having throw rugs at the top or bottom of the stairs. If you do have throw rugs, attach them to the floor with carpet tape. Make sure that you have a light switch at the top of the stairs and the bottom of the stairs. If you do not have them, ask someone to add them for you. What else can I do to help prevent falls? Wear shoes that: Do not have high heels. Have rubber bottoms. Are comfortable and fit you well. Are  closed at the toe. Do not wear sandals. If you use a stepladder: Make sure that it is fully opened. Do not climb a closed stepladder. Make sure that both sides of the stepladder are locked into place. Ask someone to hold it for you, if possible. Clearly mark and make sure that you can see: Any grab bars or handrails. First and last steps. Where the edge of each step is. Use tools that help you move around (mobility aids) if they are needed. These include: Canes. Walkers. Scooters. Crutches. Turn on the lights when you go into a dark area. Replace any light bulbs as soon as they burn out. Set up your furniture so you have a clear path. Avoid moving your furniture around. If any of your floors are uneven, fix them. If there are any pets around you, be aware of where they are. Review your medicines with your doctor. Some medicines can make you feel dizzy. This can increase your chance of falling. Ask your doctor what other things that you can do to help prevent falls. This information is not intended to replace advice given to you by your health care provider. Make sure you discuss any questions you have with your health care provider. Document Released:  09/22/2009 Document Revised: 05/03/2016 Document Reviewed: 12/31/2014 Elsevier Interactive Patient Education  2017 [employer].

## 2022-12-17 ENCOUNTER — Other Ambulatory Visit: Payer: Self-pay | Admitting: Family

## 2022-12-28 ENCOUNTER — Ambulatory Visit (INDEPENDENT_AMBULATORY_CARE_PROVIDER_SITE_OTHER): Payer: Medicare Other | Admitting: Family

## 2022-12-28 ENCOUNTER — Encounter: Payer: Self-pay | Admitting: Family

## 2022-12-28 VITALS — BP 134/76 | HR 74 | Temp 97.4°F | Ht 59.0 in | Wt 208.0 lb

## 2022-12-28 DIAGNOSIS — T466X5A Adverse effect of antihyperlipidemic and antiarteriosclerotic drugs, initial encounter: Secondary | ICD-10-CM | POA: Diagnosis not present

## 2022-12-28 DIAGNOSIS — E1159 Type 2 diabetes mellitus with other circulatory complications: Secondary | ICD-10-CM | POA: Diagnosis not present

## 2022-12-28 DIAGNOSIS — M791 Myalgia, unspecified site: Secondary | ICD-10-CM | POA: Diagnosis not present

## 2022-12-28 DIAGNOSIS — Z6841 Body Mass Index (BMI) 40.0 and over, adult: Secondary | ICD-10-CM

## 2022-12-28 DIAGNOSIS — R0602 Shortness of breath: Secondary | ICD-10-CM | POA: Diagnosis not present

## 2022-12-28 DIAGNOSIS — Z23 Encounter for immunization: Secondary | ICD-10-CM

## 2022-12-28 DIAGNOSIS — E1169 Type 2 diabetes mellitus with other specified complication: Secondary | ICD-10-CM | POA: Diagnosis not present

## 2022-12-28 DIAGNOSIS — K219 Gastro-esophageal reflux disease without esophagitis: Secondary | ICD-10-CM

## 2022-12-28 DIAGNOSIS — I152 Hypertension secondary to endocrine disorders: Secondary | ICD-10-CM | POA: Diagnosis not present

## 2022-12-28 LAB — BAYER DCA HB A1C WAIVED: HB A1C (BAYER DCA - WAIVED): 6.6 % — ABNORMAL HIGH (ref 4.8–5.6)

## 2022-12-28 MED ORDER — VALSARTAN-HYDROCHLOROTHIAZIDE 160-25 MG PO TABS: ORAL_TABLET | ORAL | 3 refills | Status: DC

## 2022-12-28 MED ORDER — OMEPRAZOLE 20 MG PO CPDR: DELAYED_RELEASE_CAPSULE | ORAL | 3 refills | Status: DC

## 2022-12-28 NOTE — Patient Instructions (Signed)
Peripheral Neuropathy Peripheral neuropathy is a type of nerve damage. It affects nerves that carry signals between the spinal cord and the arms, legs, and the rest of the body (peripheral nerves). It does not affect nerves in the spinal cord or brain. In peripheral neuropathy, one nerve or a group of nerves may be damaged. Peripheral neuropathy is a broad category that includes many specific nerve disorders, like diabetic neuropathy, hereditary neuropathy, and carpal tunnel syndrome. What are the causes? This condition may be caused by: Certain diseases, such as: Diabetes. This is the most common cause of peripheral neuropathy. Autoimmune diseases, such as rheumatoid arthritis and systemic lupus erythematosus. Nerve diseases that are passed from parent to child (inherited). Kidney disease. Thyroid disease. Other causes may include: Nerve injury. Pressure or stress on a nerve that lasts a long time. Lack (deficiency) of B vitamins. This can result from alcoholism, poor diet, or a restricted diet. Infections. Some medicines, such as cancer medicines (chemotherapy). Poisonous (toxic) substances, such as lead and mercury. Too little blood flowing to the legs. In some cases, the cause of this condition is not known. What are the signs or symptoms? Symptoms of this condition depend on which of your nerves is damaged. Symptoms in the legs, hands, and arms can include: Loss of feeling (numbness) in the feet, hands, or both. Tingling in the feet, hands, or both. Burning pain. Very sensitive skin. Weakness. Not being able to move a part of the body (paralysis). Clumsiness or poor coordination. Muscle twitching. Loss of balance. Symptoms in other parts of the body can include: Not being able to control your bladder. Feeling dizzy. Sexual problems. How is this diagnosed? Diagnosing and finding the cause of peripheral neuropathy can be difficult. Your health care provider will take your  medical history and do a physical exam. A neurological exam will also be done. This involves checking things that are affected by your brain, spinal cord, and nerves (nervous system). For example, your health care provider will check your reflexes, how you move, and what you can feel. You may have other tests, such as: Blood tests. Electromyogram (EMG) and nerve conduction tests. These tests check nerve function and how well the nerves are controlling the muscles. Imaging tests, such as a CT scan or MRI, to rule out other causes of your symptoms. Removing a small piece of nerve to be examined in a lab (nerve biopsy). Removing and examining a small amount of the fluid that surrounds the brain and spinal cord (lumbar puncture). How is this treated? Treatment for this condition may involve: Treating the underlying cause of the neuropathy, such as diabetes, kidney disease, or vitamin deficiencies. Stopping medicines that can cause neuropathy, such as chemotherapy. Medicine to help relieve pain. Medicines may include: Prescription or over-the-counter pain medicine. Anti-seizure medicine. Antidepressants. Pain-relieving patches that are applied to painful areas of skin. Surgery to relieve pressure on a nerve or to destroy a nerve that is causing pain. Physical therapy to help improve movement and balance. Devices to help you move around (assistive devices). Follow these instructions at home: Medicines Take over-the-counter and prescription medicines only as told by your health care provider. Do not take any other medicines without first asking your health care provider. Ask your health care provider if the medicine prescribed to you requires you to avoid driving or using machinery. Lifestyle  Do not use any products that contain nicotine or tobacco. These products include cigarettes, chewing tobacco, and vaping devices, such as e-cigarettes. Smoking keeps  blood from reaching damaged nerves. If you  need help quitting, ask your health care provider. Avoid or limit alcohol. Too much alcohol can cause a vitamin B deficiency, and vitamin B is needed for healthy nerves. Eat a healthy diet. This includes: Eating foods that are high in fiber, such as beans, whole grains, and fresh fruits and vegetables. Limiting foods that are high in fat and processed sugars, such as fried or sweet foods. General instructions  If you have diabetes, work closely with your health care provider to keep your blood sugar under control. If you have numbness in your feet: Check every day for signs of injury or infection. Watch for redness, warmth, and swelling. Wear padded socks and comfortable shoes. These help protect your feet. Develop a good support system. Living with peripheral neuropathy can be stressful. Consider talking with a mental health specialist or joining a support group. Use assistive devices and attend physical therapy as told by your health care provider. This may include using a walker or a cane. Keep all follow-up visits. This is important. Where to find more information National Institute of Neurological Disorders: www.ninds.nih.gov Contact a health care provider if: You have new signs or symptoms of peripheral neuropathy. You are struggling emotionally from dealing with peripheral neuropathy. Your pain is not well controlled. Get help right away if: You have an injury or infection that is not healing normally. You develop new weakness in an arm or leg. You have fallen or do so frequently. Summary Peripheral neuropathy is when the nerves in the arms or legs are damaged, resulting in numbness, weakness, or pain. There are many causes of peripheral neuropathy, including diabetes, pinched nerves, vitamin deficiencies, autoimmune disease, and hereditary conditions. Diagnosing and finding the cause of peripheral neuropathy can be difficult. Your health care provider will take your medical  history, do a physical exam, and do tests, including blood tests and nerve function tests. Treatment involves treating the underlying cause of the neuropathy and taking medicines to help control pain. Physical therapy and assistive devices may also help. This information is not intended to replace advice given to you by your health care provider. Make sure you discuss any questions you have with your health care provider. Document Revised: 08/01/2021 Document Reviewed: 08/01/2021 Elsevier Patient Education  2023 Else[employer] Inc.  

## 2022-12-28 NOTE — Progress Notes (Signed)
Subjective:    Patient ID: Robin Lindsey, female    DOB: [birth year], 77 y.o.   MRN: [id number]  Chief Complaint  Patient presents with   Medical Management of Chronic Issues    Feet and wrist pain   Pt presents to the office today for annual physical. She had COVID on 05/26/21 and states she still has SOB at times with this.    She is morbid obese with a 42.   PT complaining of burning, tingling in bilateral feet that is constant. Reports 6 out 10. Hypertension This is a chronic problem. The current episode started more than 1 year ago. The problem has been resolved since onset. The problem is controlled. Associated symptoms include anxiety, malaise/fatigue, peripheral edema and shortness of breath. Pertinent negatives include no blurred vision. Risk factors for coronary artery disease include dyslipidemia, obesity and sedentary lifestyle. The current treatment provides moderate improvement.  Gastroesophageal Reflux She complains of belching and heartburn. This is a chronic problem. The current episode started more than 1 year ago. The problem occurs occasionally. Risk factors include obesity. She has tried a PPI for the symptoms. The treatment provided moderate relief.  Diabetes She presents for her follow-up diabetic visit. She has type 2 diabetes mellitus. Pertinent negatives for hypoglycemia include no nervousness/anxiousness. Associated symptoms include foot paresthesias. Pertinent negatives for diabetes include no blurred vision. Symptoms are stable. Diabetic complications include peripheral neuropathy. Risk factors for coronary artery disease include dyslipidemia, diabetes mellitus, hypertension, sedentary lifestyle and post-menopausal. Her overall blood glucose range is 110-130 mg/dl.  Anxiety Presents for follow-up visit. Symptoms include excessive worry and shortness of breath. Patient reports no nervous/anxious behavior or restlessness. Symptoms occur rarely. The severity of  symptoms is mild.        Review of Systems  Constitutional:  Positive for malaise/fatigue.  Eyes:  Negative for blurred vision.  Respiratory:  Positive for shortness of breath.   Gastrointestinal:  Positive for heartburn.  Psychiatric/Behavioral:  The patient is not nervous/anxious.   All other systems reviewed and are negative.      Objective:   Physical Exam Vitals reviewed.  Constitutional:      General: She is not in acute distress.    Appearance: She is well-developed. She is obese.  HENT:     Head: Normocephalic and atraumatic.     Right Ear: Tympanic membrane normal.     Left Ear: Tympanic membrane normal.  Eyes:     Pupils: Pupils are equal, round, and reactive to light.  Neck:     Thyroid: No thyromegaly.  Cardiovascular:     Rate and Rhythm: Normal rate and regular rhythm.     Heart sounds: Normal heart sounds. No murmur heard. Pulmonary:     Effort: Pulmonary effort is normal. No respiratory distress.     Breath sounds: Normal breath sounds. No wheezing.  Abdominal:     General: Bowel sounds are normal. There is no distension.     Palpations: Abdomen is soft.     Tenderness: There is no abdominal tenderness.  Musculoskeletal:        General: No tenderness. Normal range of motion.     Cervical back: Normal range of motion and neck supple.  Skin:    General: Skin is warm and dry.  Neurological:     Mental Status: She is alert and oriented to person, place, and time.     Cranial Nerves: No cranial nerve deficit.     Deep Tendon Reflexes: Reflexes  are normal and symmetric.  Psychiatric:        Behavior: Behavior normal.        Thought Content: Thought content normal.        Judgment: Judgment normal.      BP 134/76   Pulse 74   Temp (!) 97.4 F (36.3 C) (Temporal)   Ht '4\' 11"'$  (1.499 m)   Wt 208 lb (94.3 kg)   SpO2 95%   BMI 42.01 kg/m       Assessment & Plan:  Robin Lindsey comes in today with chief complaint of Medical Management of  Chronic Issues (Feet and wrist pain)   Diagnosis and orders addressed:  1. Gastroesophageal reflux disease without esophagitis - omeprazole (PRILOSEC) 20 MG capsule; TAKE ONE (1) CAPSULE BY MOUTH 2 TIMES DAILY  Dispense: 180 capsule; Refill: 3 - CMP14+EGFR  2. Type 2 diabetes mellitus with other specified complication, without long-term current use of insulin (HCC) - CMP14+EGFR - Bayer DCA Hb A1c Waived  3. Hypertension associated with diabetes ([place]) - valsartan-hydrochlorothiazide (DIOVAN-HCT) 160-25 MG tablet; TAKE ONE (1) TABLET EACH DAY  Dispense: 90 tablet; Refill: 3 - CMP14+EGFR  4. Morbid obesity (HCC) - CMP14+EGFR  5. Myalgia due to statin - CMP14+EGFR  6. SOB (shortness of breath) on exertion - Ambulatory referral to Pulmonology - CT CHEST WO CONTRAST; Future - CMP14+EGFR   Labs pending Health Maintenance reviewed Diet and exercise encouraged  Follow up plan: 4 months    Evelina Dun, FNP

## 2022-12-29 LAB — CMP14+EGFR
ALT: 13 IU/L (ref 0–32)
AST: 17 IU/L (ref 0–40)
Albumin/Globulin Ratio: 1.9 (ref 1.2–2.2)
Albumin: 4.2 g/dL (ref 3.8–4.8)
Alkaline Phosphatase: 61 IU/L (ref 44–121)
BUN/Creatinine Ratio: 16 (ref 12–28)
BUN: 15 mg/dL (ref 8–27)
Bilirubin Total: 0.2 mg/dL (ref 0.0–1.2)
CO2: 25 mmol/L (ref 20–29)
Calcium: 8.9 mg/dL (ref 8.7–10.3)
Chloride: 100 mmol/L (ref 96–106)
Creatinine, Ser: 0.96 mg/dL (ref 0.57–1.00)
Globulin, Total: 2.2 g/dL (ref 1.5–4.5)
Glucose: 126 mg/dL — ABNORMAL HIGH (ref 70–99)
Potassium: 4 mmol/L (ref 3.5–5.2)
Sodium: 137 mmol/L (ref 134–144)
Total Protein: 6.4 g/dL (ref 6.0–8.5)
eGFR: 61 mL/min/{1.73_m2} (ref >59)

## 2023-01-04 ENCOUNTER — Other Ambulatory Visit: Payer: Self-pay | Admitting: Family

## 2023-01-04 MED ORDER — ALPRAZOLAM 0.5 MG PO TABS: 0.5000 mg | ORAL_TABLET | Freq: Once | ORAL | 0 refills | Status: AC

## 2023-01-04 NOTE — Progress Notes (Signed)
Xanax 0.5 mg Prescription sent to pharmacy, take this 30 mins prior to CT scan.

## 2023-01-04 NOTE — Progress Notes (Signed)
Patient aware and verbalized understanding.

## 2023-01-24 ENCOUNTER — Ambulatory Visit (INDEPENDENT_AMBULATORY_CARE_PROVIDER_SITE_OTHER): Payer: Medicare Other | Admitting: Pulmonary Disease

## 2023-01-24 ENCOUNTER — Encounter (HOSPITAL_BASED_OUTPATIENT_CLINIC_OR_DEPARTMENT_OTHER): Payer: Self-pay | Admitting: Pulmonary Disease

## 2023-01-24 VITALS — BP 130/60 | HR 78 | Temp 98.0°F | Ht 59.0 in | Wt 208.6 lb

## 2023-01-24 DIAGNOSIS — R0609 Other forms of dyspnea: Secondary | ICD-10-CM | POA: Diagnosis not present

## 2023-01-24 DIAGNOSIS — G4733 Obstructive sleep apnea (adult) (pediatric): Secondary | ICD-10-CM

## 2023-01-24 NOTE — Progress Notes (Signed)
Subjective:    Patient ID: Robin Lindsey, female    DOB: 1945-12-24, 77 y.o.   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 She reports dyspnea on walking up and down steps from her basement, resting makes it better.  She had a URI around Christmas and still has a residual dry cough. She had a cardiac workup 4 years ago and was told everything was normal. She denies daughter history of asthma. She quit smoking 50 years ago, smoked less than 10 pack years. She tries to be active, goes to chair yoga once a week. Her mother died of pulmonary fibrosis at age 76  Epworth sleepiness score is 8 and she reports some sleepiness in the afternoons. Bedtime is around 11 PM, sleep latency about 30 minutes, she reports nocturia and is awake almost every 2 hours.  She finally gets out of bed around 8 AM.  She sleeps on her back but rolls over on her side with 1 pillow.  She denies significant orthopnea.  Snoring has been noted by family members and she reports dryness of mouth when she wakes up without headache  Significant tests/ events reviewed  05/2019 echocardiogram -normal LVEF, impaired relaxation, mitral valve thickening and calcification without stenosis  05/2019 CT coronaries -right middle lobe calcified granuloma   Past Medical History:  Diagnosis Date   Arthritis    Cataract    Diabetes mellitus without complication ([place])    pt is not on diabetic medication   GERD (gastroesophageal reflux disease)    Glaucoma    worse in left eye than right eye   Heel pain    right heel pain   Hypertension    Osteoporosis    Personal history of colonic polyps 12/09/2012   Plantar fasciitis    right foot   Past Surgical History:  Procedure Laterality Date   BREAST LUMPECTOMY  10 + yrs.   left breast/benign   CATARACT EXTRACTION W/PHACO  02/14/2012   Procedure: CATARACT EXTRACTION PHACO AND  INTRAOCULAR LENS PLACEMENT ([place]);  Surgeon: Tonny Branch, MD;  Location: [facility];  Service: Ophthalmology;  Laterality: Left;  CDE 13.73   CATARACT EXTRACTION W/PHACO  03/03/2012   Procedure: CATARACT EXTRACTION PHACO AND INTRAOCULAR LENS PLACEMENT ([facility]);  Surgeon: Tonny Branch, MD;  Location: [facility];  Service: Ophthalmology;  Laterality: Right;  CDE: 16.49   COLONOSCOPY     percutaneous micrfasciotomy with topaz coblation wand  12/11/2015   rt foot surgery Right     Allergies  Allergen Reactions   Metformin And Related Nausea And Vomiting   Aspirin Nausea Only and Other (See Comments)    pain   Nsaids     Stomach pain   Statins     Myalgia     Social History   Socioeconomic History   Marital status: Married    Spouse name: Louis   Number of children: 3   Years of education: 12   Highest education level: 12th grade  Occupational History   Occupation: retired  Tobacco Use   Smoking status: Former    Packs/day: 1.00    Years: 5.00    Total pack years: 5.00    Types: Cigarettes    Start date: 03/10/1962    Quit date: 03/11/1967    Years since quitting: 55.9   Smokeless tobacco: Never   Tobacco comments:    smoked for 4-5 years from age  47- 18 years old  Vaping Use   Vaping Use: Never used  Substance and Sexual Activity   Alcohol use: No    Alcohol/week: 0.0 standard drinks of alcohol   Drug use: No   Sexual activity: Not Currently  Other Topics Concern   Not on file  Social History Narrative   Not on file   Social Determinants of Health   Financial Resource Strain: Low Risk  (11/26/2022)   Overall Financial Resource Strain (CARDIA)    Difficulty of Paying Living Expenses: Not hard at all  Food Insecurity: No Food Insecurity (11/26/2022)   Hunger Vital Sign    Worried About Running Out of Food in the Last Year: Never true    Ran Out of Food in the Last Year: Never true  Transportation Needs: No Transportation Needs (11/26/2022)   PRAPARE - Radiographer, therapeutic (Medical): No    Lack of Transportation (Non-Medical): No  Physical Activity: Insufficiently Active (11/26/2022)   Exercise Vital Sign    Days of Exercise per Week: 3 days    Minutes of Exercise per Session: 30 min  Stress: No Stress Concern Present (11/26/2022)   [place]    Feeling of Stress : Not at all  Social Connections: Moderately Integrated (11/26/2022)   Social Connection and Isolation Panel [NHANES]    Frequency of Communication with Friends and Family: More than three times a week    Frequency of Social Gatherings with Friends and Family: Three times a week    Attends Religious Services: More than 4 times per year    Active Member of Clubs or Organizations: No    Attends Archivist Meetings: Never    Marital Status: Married  Human resources officer Violence: Not At Risk (11/26/2022)   Humiliation, Afraid, Rape, and Kick questionnaire    Fear of Current or Ex-Partner: No    Emotionally Abused: No    Physically Abused: No    Sexually Abused: No    Family History  Problem Relation Age of Onset   Colon polyps Mother    Diabetes Mother    Pulmonary fibrosis Mother    Colon cancer Maternal Aunt    Pulmonary fibrosis Maternal Aunt    Heart disease Father    Stroke Father    Dementia Father    Lung disease Father        realted to asbestos exposure   Thyroid disease Brother    Diabetes Brother    Hypertension Brother    Meniere's disease Brother    Pulmonary fibrosis Maternal Aunt    Anesthesia problems Neg Hx    Hypotension Neg Hx    Malignant hyperthermia Neg Hx    Pseudochol deficiency Neg Hx      Review of Systems Shortness of breath with activity Indigestion Earache Feet swelling Joint stiffness  Constitutional: negative for anorexia, fevers and sweats  Eyes: negative for irritation, redness and visual disturbance  Ears, nose, mouth, throat, and face: negative for  earaches, epistaxis, nasal congestion and sore throat  Respiratory: negative for cough,sputum and wheezing  Cardiovascular: negative for chest pain,  orthopnea, palpitations and syncope  Gastrointestinal: negative for abdominal pain, constipation, diarrhea, melena, nausea and vomiting  Genitourinary:negative for dysuria, frequency and hematuria  Hematologic/lymphatic: negative for bleeding, easy bruising and lymphadenopathy  Musculoskeletal:negative for arthralgias, muscle weakness and stiff joints  Neurological: negative for coordination problems, gait problems, headaches and weakness  Endocrine: negative for  diabetic symptoms including polydipsia, polyuria and weight loss     Objective:   Physical Exam  Gen. Pleasant, obese, in no distress, normal affect ENT - no pallor,icterus, no post nasal drip, class 2-3 airway Neck: No JVD, no thyromegaly, no carotid bruits Lungs: no use of accessory muscles, no dullness to percussion, decreased without rales or rhonchi  Cardiovascular: Rhythm regular, heart sounds  normal, no murmurs or gallops, no peripheral edema Abdomen: soft and non-tender, no hepatosplenomegaly, BS normal. Musculoskeletal: No deformities, no cyanosis or clubbing Neuro:  alert, non focal, no tremors       Assessment & Plan:    [facility] -Given excessive daytime somnolence, narrow pharyngeal exam & loud snoring, obstructive sleep apnea is very likely & an overnight polysomnogram will be scheduled as a home study. The pathophysiology of obstructive sleep apnea , it's cardiovascular consequences & modes of treatment including CPAP were discused with the patient in detail & they evidenced understanding.

## 2023-01-24 NOTE — Assessment & Plan Note (Signed)
Etiology is not clear.  She is a remote smoker less than 10 pack years-smoking history is not significant.  There is no obvious ILD, no evidence of pulmonary hypertension on previous echo from 2020. Will repeat echocardiogram for diastolic dysfunction CT has already been scheduled by primary will change to high-resolution CT chest. If above testing is negative, we can proceed with PFTs.  If nondiagnostic, we will have to attribute it to center lifestyle and deconditioning.

## 2023-01-24 NOTE — Patient Instructions (Signed)
Cause for shortness of breath is not clear -we will check out your heart and lungs.  x change CT already scheduled to high-resolution CT chest in [place]  x ambulatory saturation  x schedule echocardiogram  x schedule home sleep test

## 2023-01-28 ENCOUNTER — Ambulatory Visit (HOSPITAL_COMMUNITY)
Admission: RE | Admit: 2023-01-28 | Discharge: 2023-01-28 | Disposition: A | Discharge status: Home / Self Care | Payer: Medicare Other | Source: Ambulatory Visit | Attending: Pulmonary Disease | Admitting: Pulmonary Disease

## 2023-01-28 ENCOUNTER — Ambulatory Visit (HOSPITAL_COMMUNITY): Payer: Medicare Other

## 2023-01-28 DIAGNOSIS — R0609 Other forms of dyspnea: Secondary | ICD-10-CM | POA: Diagnosis not present

## 2023-01-28 DIAGNOSIS — J479 Bronchiectasis, uncomplicated: Secondary | ICD-10-CM | POA: Diagnosis not present

## 2023-01-28 DIAGNOSIS — D3501 Benign neoplasm of right adrenal gland: Secondary | ICD-10-CM | POA: Diagnosis not present

## 2023-01-28 DIAGNOSIS — J8410 Pulmonary fibrosis, unspecified: Secondary | ICD-10-CM | POA: Diagnosis not present

## 2023-01-28 DIAGNOSIS — I700 Atherosclerosis of aorta: Secondary | ICD-10-CM | POA: Diagnosis not present

## 2023-01-31 ENCOUNTER — Telehealth (HOSPITAL_BASED_OUTPATIENT_CLINIC_OR_DEPARTMENT_OTHER): Payer: Self-pay | Admitting: Pulmonary Disease

## 2023-01-31 ENCOUNTER — Telehealth: Payer: Self-pay | Admitting: Family

## 2023-01-31 DIAGNOSIS — R0609 Other forms of dyspnea: Secondary | ICD-10-CM

## 2023-01-31 NOTE — Telephone Encounter (Signed)
I spoke to pt and advised her she should call the ordering provider to go over results and recommendations and pt voiced understanding.

## 2023-01-31 NOTE — Telephone Encounter (Signed)
Pt called and stated she had read the results of her CT scan 2/19 and the three-vessel coronary atherosclerosis has gotten her worried and wants to know if she needs to be seen asap. Please advise.

## 2023-02-01 NOTE — Telephone Encounter (Signed)
Does not look like CT has been reviewed by Dr. Elsworth Soho yet. Routing to Dr. Elsworth Soho to get results of pt's CT. Please advise.

## 2023-02-04 NOTE — Telephone Encounter (Signed)
Called and spoke with Robin Lindsey letting her know the results of CT and recs per Dr. Elsworth Soho.Robin Lindsey wanted to hold off on scheduling a PFT at this time and wanted to be referred to a cardiologist to get her heart checked out first. Robin Lindsey did have a cardiologist but not anymore so referral has been placed. Nothing further needed.

## 2023-02-05 DIAGNOSIS — H353131 Nonexudative age-related macular degeneration, bilateral, early dry stage: Secondary | ICD-10-CM | POA: Diagnosis not present

## 2023-02-05 DIAGNOSIS — H401123 Primary open-angle glaucoma, left eye, severe stage: Secondary | ICD-10-CM | POA: Diagnosis not present

## 2023-02-05 DIAGNOSIS — Z961 Presence of intraocular lens: Secondary | ICD-10-CM | POA: Diagnosis not present

## 2023-02-05 DIAGNOSIS — H401111 Primary open-angle glaucoma, right eye, mild stage: Secondary | ICD-10-CM | POA: Diagnosis not present

## 2023-02-05 DIAGNOSIS — E119 Type 2 diabetes mellitus without complications: Secondary | ICD-10-CM | POA: Diagnosis not present

## 2023-02-15 NOTE — Progress Notes (Signed)
Cardiology Office Note:    Date:  03/03/2023   ID:  Robin Lindsey, DOB 02-19-1946, MRN [id number]  PCP:  Sharion Balloon, FNP  Cardiologist:  Buford Dresser, MD  Referring MD: Rigoberto Noel, MD   CC:  New patient evaluation for DOE  History of Present Illness:    Robin Lindsey is a 77 y.o. female with a hx of hypertension, diabetes, glaucoma, GERD, arthritis, and osteoporosis, who is seen as a new consult at the request of Rigoberto Noel, MD for the evaluation and management of dyspnea on exertion.  She was seen by Dr. Elsworth Soho 01/24/2023 and reported ongoing, progressive shortness of breath for 5 years. She noted DOE with walking up and down stairs, improved with rest. She was referred for a home sleep study. She had a chest CT showing no evidence of pulmonary fibrosis, but did reveal coronary atherosclerosis which had been noted on prior CT 05/2019. As the cause of her dyspnea had not yet been determined she was referred to cardiology for further evaluation.  Cardiovascular risk factors: Prior clinical ASCVD:  None. Comorbid conditions:  Hypertension - for a long time, at least since her late 64's. At one time she was told she had diabetes, but was intolerant of metformin. She had managed with dietary changes. Most recent A1C of 6.6. Last year she was told her cholesterol was elevated; she deferred statin therapy previously due to already having myalgias and never tried one. She complains of arm and wrist stiffness. She had multiple family members who developed myalgias on statin therapy. Metabolic syndrome/Obesity: Current weight is 210 lbs. Chronic inflammatory conditions:  None. She endorses a lot of generalized inflammation and joint pain. Previously she had been taking meloxicam for 1 year. Tobacco use history: Former smoker. Quit smoking when she was 77 yo (had smoked less than 10 pack years). Family history: Her mother died of pulmonary fibrosis at age 13. Her father had a valve  replaced and had a stroke. Her paternal grandmother had a stroke. Prior cardiac testing and/or incidental findings on other testing (ie coronary calcium): Previously followed by Dr. Bronson Ing, initially seen in 2015 for chest pressure. Last seen by him 07/31/2019. They discussed her workup for DOE. Coronary CT angiography 05/2019 showed minimal nonobstructive coronary artery disease. Calcium score was 49 which was in the 50th percentile. Echocardiogram demonstrated hyperdynamic systolic function with an LVEF greater than 65%.  There was grade 1 diastolic dysfunction. No further cardiac investigations were indicated at that time. Nothing on physical exam to suggest pulmonary fibrosis.  Exercise level:  When she tries to walk she develops central chest pressure and becomes very short winded. This has been an ongoing issue for about 5-6 years. Lately she believes her symptoms are progressing as she frequently needs to stop and rest. Additionally she is limited by her feet, which she states is related to neuropathy. She tries to exercise by walking on a 0.5 mile track, able to complete about 1/4 of this without needing to sit down. Needs to rest at least twice in order to complete the 1/2 mile. Of note, the track is not all level ground. While going shopping she is unable to walk very fast. She denies any syncopal episodes. Current diet: She has been on prilosec for years. Her acid reflux will return if she happens to miss one dose. She is intolerant of calcium and vitamin B.   She received the sleep study equipment on Saturday and will complete this  soon.  She denies any palpitations, lightheadedness, headaches, syncope, orthopnea, or PND.  Past Medical History:  Diagnosis Date   Arthritis    Cataract    Diabetes mellitus without complication ([place])    pt is not on diabetic medication   GERD (gastroesophageal reflux disease)    Glaucoma    worse in left eye than right eye   Heel pain    right heel pain    Hypertension    Osteoporosis    Personal history of colonic polyps 12/09/2012   Plantar fasciitis    right foot    Past Surgical History:  Procedure Laterality Date   BREAST LUMPECTOMY  10 + yrs.   left breast/benign   CATARACT EXTRACTION W/PHACO  02/14/2012   Procedure: CATARACT EXTRACTION PHACO AND INTRAOCULAR LENS PLACEMENT ([place]);  Surgeon: Tonny Branch, MD;  Location: [facility];  Service: Ophthalmology;  Laterality: Left;  CDE 13.73   CATARACT EXTRACTION W/PHACO  03/03/2012   Procedure: CATARACT EXTRACTION PHACO AND INTRAOCULAR LENS PLACEMENT ([facility]);  Surgeon: Tonny Branch, MD;  Location: [facility];  Service: Ophthalmology;  Laterality: Right;  CDE: 16.49   COLONOSCOPY     percutaneous micrfasciotomy with topaz coblation wand  12/11/2015   rt foot surgery Right     Current Medications: Current Outpatient Medications on File Prior to Visit  Medication Sig   denosumab (PROLIA) 60 MG/ML SOSY injection Inject 60 mg into the skin every 6 (six) months.   LUMIGAN 0.01 % SOLN Place into both eyes daily.    omeprazole (PRILOSEC) 20 MG capsule TAKE ONE (1) CAPSULE BY MOUTH 2 TIMES DAILY   valsartan-hydrochlorothiazide (DIOVAN-HCT) 160-25 MG tablet TAKE ONE (1) TABLET EACH DAY   No current facility-administered medications on file prior to visit.     Allergies:   Metformin and related, Aspirin, and Nsaids   Social History   Tobacco Use   Smoking status: Former    Packs/day: 1.00    Years: 5.00    Additional pack years: 0.00    Total pack years: 5.00    Types: Cigarettes    Start date: 03/10/1962    Quit date: 03/11/1967    Years since quitting: 56.0   Smokeless tobacco: Never   Tobacco comments:    smoked for 4-5 years from age 77- 54 years old  Vaping Use   Vaping Use: Never used  Substance Use Topics   Alcohol use: No    Alcohol/week: 0.0 standard drinks of alcohol   Drug use: No    Family History: family history includes Colon cancer in her maternal aunt; Colon polyps in her  mother; Dementia in her father; Diabetes in her brother and mother; Heart disease in her father; Hypertension in her brother; Lung disease in her father; Meniere's disease in her brother; Pulmonary fibrosis in her maternal aunt, maternal aunt, and mother; Stroke in her father; Thyroid disease in her brother. There is no history of Anesthesia problems, Hypotension, Malignant hyperthermia, or Pseudochol deficiency.  ROS:   Please see the history of present illness.  Additional pertinent ROS: Constitutional: Negative for chills, fever, night sweats, unintentional weight loss  HENT: Negative for ear pain and hearing loss.   Eyes: Negative for loss of vision and eye pain.  Respiratory: Negative for cough, sputum, wheezing. Positive for shortness of breath. Cardiovascular: See HPI. Gastrointestinal: Negative for abdominal pain, melena, and hematochezia.  Genitourinary: Negative for dysuria and hematuria.  Musculoskeletal: Negative for falls. Stiffness of arms and wrists. Skin: Negative for itching and  rash.  Neurological: Negative for focal weakness, focal sensory changes and loss of consciousness.  Endo/Heme/Allergies: Does not bruise/bleed easily.     EKGs/Labs/Other Studies Reviewed:    The following studies were reviewed today:  CT Chest  01/28/2023: IMPRESSION: 1. Mild patchy air trapping in both lungs, indicative of small airways disease. 2. Scattered mild cylindrical bronchiolectasis in the right middle lobe and left upper lobe. No evidence of interstitial lung disease. 3. Mild patchy tree-in-bud type tiny pulmonary nodules at the periphery of the right lower lobe measuring up to 0.3 cm, new from 05/21/2019 CT, most suggestive of a nonspecific mild infectious or inflammatory bronchiolitis. No follow-up needed if patient is low-risk (and has no known or suspected primary neoplasm). Non-contrast chest CT can be considered in 12 months if patient is high-risk. This recommendation  follows the consensus statement: Guidelines for Management of Incidental Pulmonary Nodules Detected on CT Images: From the Fleischner Society 2017; Radiology 2017; 284:228-243. 4. Three-vessel coronary atherosclerosis. 5. Right adrenal adenoma, for which no follow-up imaging is recommended. 6.  Aortic Atherosclerosis (ICD10-I70.0).  Echo  06/03/2019:  1. The left ventricle has hyperdynamic systolic function, with an  ejection fraction of >65%. The cavity size was normal. There is mildly  increased left ventricular wall thickness. Left ventricular diastolic  Doppler parameters are consistent with  impaired relaxation.   2. The right ventricle has normal systolic function. The cavity was  normal. There is no increase in right ventricular wall thickness.   3. Left atrial size was mildly dilated.   4. The aortic valve is tricuspid. Mild thickening of the aortic valve.  Mild calcification of the aortic valve. No stenosis of the aortic valve.  Mild aortic annular calcification noted.   5. The mitral valve is abnormal. Mild thickening of the mitral valve  leaflet. Mild calcification of the mitral valve leaflet. There is mild  mitral annular calcification present. No evidence of mitral valve  stenosis.   6. The aortic root is normal in size and structure.   7. Pulmonary hypertension is indeterminant, inadequate TR jet.   Coronary CTA  05/21/2019: IMPRESSION: 1. Calcium score 49 which is 69 th percentile for age and sex   2.  CAD RADS one minimal non obstructive CAD see description above   3.  Normal aortic root 3.0 cm  Stress Test  05/12/2014: IMPRESSION:  1. Normal exercise Cardiolite stress test.   2.  No evidence of myocardial ischemia or scar.   3. Normal left ventricular systolic function and regional wall  motion, calculated LVEF 71%.    EKG:  EKG is personally reviewed.   02/18/2023:  nsr at 78 bpm  Recent Labs: 06/28/2022: Hemoglobin 12.8; Platelets 241 12/28/2022: ALT 13;  BUN 15; Creatinine, Ser 0.96; Potassium 4.0; Sodium 137   Recent Lipid Panel    Component Value Date/Time   CHOL 191 06/28/2022 1519   TRIG 152 (H) 06/28/2022 1519   HDL 65 06/28/2022 1519   CHOLHDL 2.9 06/28/2022 1519   CHOLHDL 3.2 04/23/2013 1052   VLDL 18 04/23/2013 1052   LDLCALC 100 (H) 06/28/2022 1519    Physical Exam:    VS:  BP 130/70   Pulse 78   Ht 4\' 11"  (1.499 m)   Wt 210 lb (95.3 kg)   BMI 42.41 kg/m     Wt Readings from Last 3 Encounters:  02/20/23 208 lb (94.3 kg)  02/18/23 210 lb (95.3 kg)  01/24/23 208 lb 9.6 oz (94.6 kg)  GEN: Well nourished, well developed in no acute distress HEENT: Normal, moist mucous membranes NECK: No JVD CARDIAC: regular rhythm, normal S1 and S2, no rubs or gallops. No murmur. VASCULAR: Radial and DP pulses 2+ bilaterally. No carotid bruits RESPIRATORY:  Bilateral crackles/popping with deep breaths, without rales, wheezing or rhonchi  ABDOMEN: Soft, non-tender, non-distended MUSCULOSKELETAL:  Ambulates independently SKIN: Warm and dry, no edema NEUROLOGIC:  Alert and oriented x 3. No focal neuro deficits noted. PSYCHIATRIC:  Normal affect    ASSESSMENT:    1. Dyspnea on exertion   2. Type 2 diabetes mellitus with other specified complication, without long-term current use of insulin ([place])   3. Primary hypertension   4. Pure hypercholesterolemia   5. Nonocclusive coronary atherosclerosis of native coronary artery   6. Aortic atherosclerosis (HCC)    PLAN:    Dyspnea on exertion -reviewed CT coronary in 2020, calcium score 49, cadsrads 1, 1-24% stenosis in prox LAD and prox Lcx -given that she has already had an anatomic test, will do a functional test. Suspicion for ischemia is low, more interested in what her HR/BP do with activity -will order full echo and ETT. Her A1c is 6.6, only just at level of diabetes. Want to minimize radiation exposure, so will not order nuclear imaging at this time. -reviewed CT lung  results  Shared Decision Making/Informed Consent The risks [chest pain, shortness of breath, cardiac arrhythmias, dizziness, blood pressure fluctuations, myocardial infarction, stroke/transient ischemic attack, and life-threatening complications (estimated to be 1 in 10,000)], benefits (risk stratification, diagnosing coronary artery disease, treatment guidance) and alternatives of an exercise tolerance test were discussed in detail with Ms. Nierenberg and she agrees to proceed.   Hypertension -at goal -continue valsartan-hctz  Type II diabetes Hypercholesterolemia Aortic atherosclerosis Nonobstructive CAD -reviewed CT coronary in 2020 -has not required medication for her diabetes, only most recent A1c 6.6 (rest of recent have been below threshold) -we discussed aspirin and statin today. She has never tried a statin, worried about myalgia. Will start with low dose rosuvastatin and titrate as able  Cardiac risk counseling and prevention recommendations: -recommend heart healthy/Mediterranean diet, with whole grains, fruits, vegetable, fish, lean meats, nuts, and olive oil. Limit salt. -recommend moderate walking, 3-5 times/week for 30-50 minutes each session. Aim for at least 150 minutes.week. Goal should be pace of 3 miles/hours, or walking 1.5 miles in 30 minutes -recommend avoidance of tobacco products. Avoid excess alcohol. -ASCVD risk score: The 10-year ASCVD risk score (Arnett DK, et al., 2019) is: 58.7%   Values used to calculate the score:     Age: 81 years     Sex: Female     Is Non-Hispanic African American: No     Diabetic: Yes     Tobacco smoker: No     Systolic Blood Pressure: 123456 mmHg     Is BP treated: Yes     HDL Cholesterol: 65 mg/dL     Total Cholesterol: 191 mg/dL    Plan for follow up: 1 year or sooner as needed.  Buford Dresser, MD, PhD, [facility]    Medication Adjustments/Labs and Tests Ordered: Current medicines are reviewed at  length with the patient today.  Concerns regarding medicines are outlined above.   Orders Placed This Encounter  Procedures   Exercise Tolerance Test   EKG 12-Lead   Meds ordered this encounter  Medications   rosuvastatin (CRESTOR) 5 MG tablet    Sig: Take 1 tablet (5 mg total)  by mouth daily.    Dispense:  30 tablet    Refill:  11   Patient Instructions  Medication Instructions:  Your physician recommends that you continue on your current medications as directed. Please refer to the Current Medication list given to you today.  *If you need a refill on your cardiac medications before your next appointment, please call your pharmacy*  Lab Work: NONE  Testing/Procedures: Your physician has requested that you have an echocardiogram. Echocardiography is a painless test that uses sound waves to create images of your heart. It provides your doctor with information about the size and shape of your heart and how well your heart's chambers and valves are working. This procedure takes approximately one hour. There are no restrictions for this procedure. Please do NOT wear cologne, perfume, aftershave, or lotions (deodorant is allowed). Please arrive 15 minutes prior to your appointment time.  Your physician has requested that you have an exercise tolerance test. For further information please visit [email or web address]. Please also follow instruction sheet, as given.  Follow-Up: At [facility], you and your health needs are our priority.  As part of our continuing mission to provide you with exceptional heart care, we have created designated Provider Care Teams.  These Care Teams include your primary Cardiologist (physician) and Advanced Practice Providers (APPs -  Physician Assistants and Nurse Practitioners) who all work together to provide you with the care you need, when you need it.  We recommend signing up for the patient portal called "MyChart".  Sign up information is provided  on this After Visit Summary.  MyChart is used to connect with patients for Virtual Visits (Telemedicine).  Patients are able to view lab/test results, encounter notes, upcoming appointments, etc.  Non-urgent messages can be sent to your provider as well.   To learn more about what you can do with MyChart, go to [email or web address].    Your next appointment:   12 month(s)  The format for your next appointment:   In Person  Provider:   Buford Dresser, MD       [facility] Stump[facility],acting as a scribe for Buford Dresser, MD.,have documented all relevant documentation on the behalf of Buford Dresser, MD,as directed by  Buford Dresser, MD while in the presence of Buford Dresser, MD.  I, Buford Dresser, MD, have reviewed all documentation for this visit. The documentation on 03/03/23 for the exam, diagnosis, procedures, and orders are all accurate and complete.   Signed, Buford Dresser, MD PhD 03/03/2023 9:57 PM    [place]

## 2023-02-18 ENCOUNTER — Encounter (HOSPITAL_BASED_OUTPATIENT_CLINIC_OR_DEPARTMENT_OTHER): Payer: Self-pay | Admitting: Cardiology

## 2023-02-18 ENCOUNTER — Ambulatory Visit (HOSPITAL_BASED_OUTPATIENT_CLINIC_OR_DEPARTMENT_OTHER): Payer: Medicare Other | Admitting: Cardiology

## 2023-02-18 VITALS — BP 130/70 | HR 78 | Ht 59.0 in | Wt 210.0 lb

## 2023-02-18 DIAGNOSIS — E1169 Type 2 diabetes mellitus with other specified complication: Secondary | ICD-10-CM

## 2023-02-18 DIAGNOSIS — I700 Atherosclerosis of aorta: Secondary | ICD-10-CM

## 2023-02-18 DIAGNOSIS — E7800 Pure hypercholesterolemia, unspecified: Secondary | ICD-10-CM

## 2023-02-18 DIAGNOSIS — I152 Hypertension secondary to endocrine disorders: Secondary | ICD-10-CM

## 2023-02-18 DIAGNOSIS — I2510 Atherosclerotic heart disease of native coronary artery without angina pectoris: Secondary | ICD-10-CM

## 2023-02-18 DIAGNOSIS — R0609 Other forms of dyspnea: Secondary | ICD-10-CM | POA: Diagnosis not present

## 2023-02-18 DIAGNOSIS — I10 Essential (primary) hypertension: Secondary | ICD-10-CM

## 2023-02-18 MED ORDER — ROSUVASTATIN CALCIUM 5 MG PO TABS: 5.0000 mg | ORAL_TABLET | Freq: Every day | ORAL | 11 refills | Status: DC

## 2023-02-18 NOTE — Patient Instructions (Signed)
Medication Instructions:  Your physician recommends that you continue on your current medications as directed. Please refer to the Current Medication list given to you today.  *If you need a refill on your cardiac medications before your next appointment, please call your pharmacy*  Lab Work: NONE  Testing/Procedures: Your physician has requested that you have an echocardiogram. Echocardiography is a painless test that uses sound waves to create images of your heart. It provides your doctor with information about the size and shape of your heart and how well your heart's chambers and valves are working. This procedure takes approximately one hour. There are no restrictions for this procedure. Please do NOT wear cologne, perfume, aftershave, or lotions (deodorant is allowed). Please arrive 15 minutes prior to your appointment time.  Your physician has requested that you have an exercise tolerance test. For further information please visit [email or web address]. Please also follow instruction sheet, as given.  Follow-Up: At [facility], you and your health needs are our priority.  As part of our continuing mission to provide you with exceptional heart care, we have created designated Provider Care Teams.  These Care Teams include your primary Cardiologist (physician) and Advanced Practice Providers (APPs -  Physician Assistants and Nurse Practitioners) who all work together to provide you with the care you need, when you need it.  We recommend signing up for the patient portal called "MyChart".  Sign up information is provided on this After Visit Summary.  MyChart is used to connect with patients for Virtual Visits (Telemedicine).  Patients are able to view lab/test results, encounter notes, upcoming appointments, etc.  Non-urgent messages can be sent to your provider as well.   To learn more about what you can do with MyChart, go to [email or web address].    Your next appointment:   12  month(s)  The format for your next appointment:   In Person  Provider:   Buford Dresser, MD

## 2023-02-19 DIAGNOSIS — G4730 Sleep apnea, unspecified: Secondary | ICD-10-CM | POA: Diagnosis not present

## 2023-02-20 ENCOUNTER — Ambulatory Visit (INDEPENDENT_AMBULATORY_CARE_PROVIDER_SITE_OTHER): Payer: Medicare Other | Admitting: Family Medicine

## 2023-02-20 ENCOUNTER — Encounter: Payer: Self-pay | Admitting: Family Medicine

## 2023-02-20 VITALS — BP 148/70 | HR 83 | Temp 97.1°F | Ht 59.0 in | Wt 208.0 lb

## 2023-02-20 DIAGNOSIS — M545 Low back pain, unspecified: Secondary | ICD-10-CM

## 2023-02-20 DIAGNOSIS — R109 Unspecified abdominal pain: Secondary | ICD-10-CM

## 2023-02-20 DIAGNOSIS — R10A2 Flank pain, left side: Secondary | ICD-10-CM

## 2023-02-20 LAB — MICROSCOPIC EXAMINATION
RBC, Urine: NONE SEEN /hpf (ref 0–2)
Renal Epithel, UA: NONE SEEN /hpf

## 2023-02-20 LAB — URINALYSIS, ROUTINE W REFLEX MICROSCOPIC
Bilirubin, UA: NEGATIVE
Glucose, UA: NEGATIVE
Ketones, UA: NEGATIVE
Nitrite, UA: NEGATIVE
Protein,UA: NEGATIVE
Specific Gravity, UA: 1.01 (ref 1.005–1.030)
Urobilinogen, Ur: 0.2 mg/dL (ref 0.2–1.0)
pH, UA: 5.5 (ref 5.0–7.5)

## 2023-02-20 MED ORDER — PREDNISONE 20 MG PO TABS: 40.0000 mg | ORAL_TABLET | Freq: Every day | ORAL | 0 refills | Status: AC

## 2023-02-20 NOTE — Progress Notes (Signed)
Subjective:  Patient ID: Robin Lindsey, female    DOB: 1946-02-01, 77 y.o.   MRN: [id number]  Patient Care Team: Sharion Balloon, FNP as PCP - General (Family Medicine) Buford Dresser, MD as PCP - Cardiology (Cardiology) Clent Jacks, MD as Consulting Physician (Ophthalmology) Steffanie Rainwater, DPM as Consulting Physician (Podiatry) Maisie Fus, MD (Inactive) as Consulting Physician (Obstetrics and Gynecology) Lavera Guise, [facility] as Pharmacist (Family Medicine)   Chief Complaint:  Back Pain (Left side) and Flank Pain   HPI: [name] is a 77 y.o. female presenting on 02/20/2023 for Back Pain (Left side) and Flank Pain   Back Pain This is a new problem. The current episode started in the past 7 days. The problem has been waxing and waning since onset. The pain is present in the gluteal and lumbar spine. The quality of the pain is described as aching and cramping. The pain does not radiate. The pain is at a severity of 3/10. The pain is mild. The symptoms are aggravated by bending, standing and twisting. Pertinent negatives include no abdominal pain, bladder incontinence, bowel incontinence, chest pain, dysuria, fever, headaches, leg pain, numbness, paresis, paresthesias, pelvic pain, perianal numbness, tingling, weakness or weight loss. She has tried analgesics (Tylenol) for the symptoms. The treatment provided significant relief.     Relevant past medical, surgical, family, and social history reviewed and updated as indicated.  Allergies and medications reviewed and updated. Data reviewed: Chart in Epic.   Past Medical History:  Diagnosis Date   Arthritis    Cataract    Diabetes mellitus without complication ([place])    pt is not on diabetic medication   GERD (gastroesophageal reflux disease)    Glaucoma    worse in left eye than right eye   Heel pain    right heel pain   Hypertension    Osteoporosis    Personal history of colonic polyps 12/09/2012   Plantar  fasciitis    right foot    Past Surgical History:  Procedure Laterality Date   BREAST LUMPECTOMY  10 + yrs.   left breast/benign   CATARACT EXTRACTION W/PHACO  02/14/2012   Procedure: CATARACT EXTRACTION PHACO AND INTRAOCULAR LENS PLACEMENT ([place]);  Surgeon: Tonny Branch, MD;  Location: [facility];  Service: Ophthalmology;  Laterality: Left;  CDE 13.73   CATARACT EXTRACTION W/PHACO  03/03/2012   Procedure: CATARACT EXTRACTION PHACO AND INTRAOCULAR LENS PLACEMENT ([facility]);  Surgeon: Tonny Branch, MD;  Location: [facility];  Service: Ophthalmology;  Laterality: Right;  CDE: 16.49   COLONOSCOPY     percutaneous micrfasciotomy with topaz coblation wand  12/11/2015   rt foot surgery Right     Social History   Socioeconomic History   Marital status: Married    Spouse name: Louis   Number of children: 3   Years of education: 12   Highest education level: 12th grade  Occupational History   Occupation: retired  Tobacco Use   Smoking status: Former    Packs/day: 1.00    Years: 5.00    Total pack years: 5.00    Types: Cigarettes    Start date: 03/10/1962    Quit date: 03/11/1967    Years since quitting: 55.9   Smokeless tobacco: Never   Tobacco comments:    smoked for 4-5 years from age 60- 31 years old  Vaping Use   Vaping Use: Never used  Substance and Sexual Activity   Alcohol use: No    Alcohol/week:  0.0 standard drinks of alcohol   Drug use: No   Sexual activity: Not Currently  Other Topics Concern   Not on file  Social History Narrative   Not on file   Social Determinants of Health   Financial Resource Strain: Low Risk  (11/26/2022)   Overall Financial Resource Strain (CARDIA)    Difficulty of Paying Living Expenses: Not hard at all  Food Insecurity: No Food Insecurity (11/26/2022)   Hunger Vital Sign    Worried About Running Out of Food in the Last Year: Never true    Ran Out of Food in the Last Year: Never true  Transportation Needs: No Transportation Needs (11/26/2022)    PRAPARE - Hydrologist (Medical): No    Lack of Transportation (Non-Medical): No  Physical Activity: Insufficiently Active (11/26/2022)   Exercise Vital Sign    Days of Exercise per Week: 3 days    Minutes of Exercise per Session: 30 min  Stress: No Stress Concern Present (11/26/2022)   [place]    Feeling of Stress : Not at all  Social Connections: Moderately Integrated (11/26/2022)   Social Connection and Isolation Panel [NHANES]    Frequency of Communication with Friends and Family: More than three times a week    Frequency of Social Gatherings with Friends and Family: Three times a week    Attends Religious Services: More than 4 times per year    Active Member of Clubs or Organizations: No    Attends Archivist Meetings: Never    Marital Status: Married  Human resources officer Violence: Not At Risk (11/26/2022)   Humiliation, Afraid, Rape, and Kick questionnaire    Fear of Current or Ex-Partner: No    Emotionally Abused: No    Physically Abused: No    Sexually Abused: No    Outpatient Encounter Medications as of 02/20/2023  Medication Sig   denosumab (PROLIA) 60 MG/ML SOSY injection Inject 60 mg into the skin every 6 (six) months.   LUMIGAN 0.01 % SOLN Place into both eyes daily.    omeprazole (PRILOSEC) 20 MG capsule TAKE ONE (1) CAPSULE BY MOUTH 2 TIMES DAILY   predniSONE (DELTASONE) 20 MG tablet Take 2 tablets (40 mg total) by mouth daily with breakfast for 5 days. 2 po daily for 5 days   rosuvastatin (CRESTOR) 5 MG tablet Take 1 tablet (5 mg total) by mouth daily.   valsartan-hydrochlorothiazide (DIOVAN-HCT) 160-25 MG tablet TAKE ONE (1) TABLET EACH DAY   No facility-administered encounter medications on file as of 02/20/2023.    Allergies  Allergen Reactions   Metformin And Related Nausea And Vomiting   Aspirin Nausea Only and Other (See Comments)    pain   Nsaids      Stomach pain    Review of Systems  Constitutional:  Positive for activity change. Negative for appetite change, chills, diaphoresis, fatigue, fever, unexpected weight change and weight loss.  Eyes:  Negative for photophobia and visual disturbance.  Respiratory:  Negative for cough and shortness of breath.   Cardiovascular:  Negative for chest pain.  Gastrointestinal:  Negative for abdominal distention, abdominal pain, anal bleeding, blood in stool, bowel incontinence, constipation, diarrhea, nausea, rectal pain and vomiting.  Genitourinary:  Positive for frequency. Negative for bladder incontinence, decreased urine volume, difficulty urinating, dysuria, enuresis, flank pain, genital sores, hematuria, pelvic pain, urgency, vaginal bleeding, vaginal discharge and vaginal pain.  Musculoskeletal:  Positive for back pain.  Negative for arthralgias, gait problem, joint swelling, myalgias, neck pain and neck stiffness.  Neurological:  Negative for tingling, weakness, numbness, headaches and paresthesias.  Psychiatric/Behavioral:  Negative for confusion.   All other systems reviewed and are negative.       Objective:  BP (!) 148/70   Pulse 83   Temp (!) 97.1 F (36.2 C) (Temporal)   Ht '4\' 11"'$  (1.499 m)   Wt 208 lb (94.3 kg)   SpO2 96%   BMI 42.01 kg/m    Wt Readings from Last 3 Encounters:  02/20/23 208 lb (94.3 kg)  02/18/23 210 lb (95.3 kg)  01/24/23 208 lb 9.6 oz (94.6 kg)    Physical Exam Vitals and nursing note reviewed.  Constitutional:      General: She is not in acute distress.    Appearance: Normal appearance. She is well-developed and well-groomed. She is obese. She is not ill-appearing, toxic-appearing or diaphoretic.  HENT:     Head: Normocephalic and atraumatic.     Jaw: There is normal jaw occlusion.     Right Ear: Hearing normal.     Left Ear: Hearing normal.     Nose: Nose normal.     Mouth/Throat:     Lips: Pink.     Mouth: Mucous membranes are moist.      Pharynx: Oropharynx is clear. Uvula midline.  Eyes:     General: Lids are normal.     Extraocular Movements: Extraocular movements intact.     Conjunctiva/sclera: Conjunctivae normal.     Pupils: Pupils are equal, round, and reactive to light.  Neck:     Thyroid: No thyroid mass, thyromegaly or thyroid tenderness.     Vascular: No carotid bruit or JVD.     Trachea: Trachea and phonation normal.  Cardiovascular:     Rate and Rhythm: Normal rate and regular rhythm.     Chest Wall: PMI is not displaced.     Pulses: Normal pulses.     Heart sounds: Normal heart sounds. No murmur heard.    No friction rub. No gallop.  Pulmonary:     Effort: Pulmonary effort is normal. No respiratory distress.     Breath sounds: Normal breath sounds. No wheezing.  Abdominal:     General: Bowel sounds are normal. There is no distension or abdominal bruit.     Palpations: Abdomen is soft. There is no hepatomegaly, splenomegaly or mass.     Tenderness: There is no abdominal tenderness. There is no right CVA tenderness, left CVA tenderness, guarding or rebound.     Hernia: No hernia is present.  Musculoskeletal:        General: Normal range of motion.     Cervical back: Normal range of motion and neck supple.     Thoracic back: Normal.     Lumbar back: Tenderness present. No swelling, edema, deformity, signs of trauma, lacerations, spasms or bony tenderness. Normal range of motion. Negative right straight leg raise test and negative left straight leg raise test. No scoliosis.     Right hip: Normal.     Left hip: Normal.     Right lower leg: No edema.     Left lower leg: No edema.     Comments: Left Paraspinous muscle tenderness  Lymphadenopathy:     Cervical: No cervical adenopathy.  Skin:    General: Skin is warm and dry.     Capillary Refill: Capillary refill takes less than 2 seconds.     Coloration: Skin  is not cyanotic, jaundiced or pale.     Findings: No rash.  Neurological:     General: No  focal deficit present.     Mental Status: She is alert and oriented to person, place, and time.     Sensory: Sensation is intact.     Motor: Motor function is intact.     Coordination: Coordination is intact.     Gait: Gait is intact.     Deep Tendon Reflexes: Reflexes are normal and symmetric.  Psychiatric:        Attention and Perception: Attention and perception normal.        Mood and Affect: Mood and affect normal.        Speech: Speech normal.        Behavior: Behavior normal. Behavior is cooperative.        Thought Content: Thought content normal.        Cognition and Memory: Cognition and memory normal.        Judgment: Judgment normal.     Results for orders placed or performed in visit on 12/28/22  CMP14+EGFR  Result Value Ref Range   Glucose 126 (H) 70 - 99 mg/dL   BUN 15 8 - 27 mg/dL   Creatinine, Ser 0.96 0.57 - 1.00 mg/dL   eGFR 61 >59 mL/min/1.73   BUN/Creatinine Ratio 16 12 - 28   Sodium 137 134 - 144 mmol/L   Potassium 4.0 3.5 - 5.2 mmol/L   Chloride 100 96 - 106 mmol/L   CO2 25 20 - 29 mmol/L   Calcium 8.9 8.7 - 10.3 mg/dL   Total Protein 6.4 6.0 - 8.5 g/dL   Albumin 4.2 3.8 - 4.8 g/dL   Globulin, Total 2.2 1.5 - 4.5 g/dL   Albumin/Globulin Ratio 1.9 1.2 - 2.2   Bilirubin Total 0.2 0.0 - 1.2 mg/dL   Alkaline Phosphatase 61 44 - 121 IU/L   AST 17 0 - 40 IU/L   ALT 13 0 - 32 IU/L  Bayer DCA Hb A1c Waived  Result Value Ref Range   HB A1C (BAYER DCA - WAIVED) 6.6 (H) 4.8 - 5.6 %       Pertinent labs & imaging results that were available during my care of the patient were reviewed by me and considered in my medical decision making.  Assessment & Plan:  Briyah was seen today for back pain and flank pain.  Diagnoses and all orders for this visit:  Left flank pain Urinalysis with trace leukocytes and blood. Will add culture and treat if warranted. No indications of acute UTI or pyelonephritis. Pt aware of red flags and to report new, worsening, or  persistent symptoms.  -     Urinalysis, Routine w reflex microscopic  Acute left-sided low back pain without sciatica Exam consistent with low back pain, Paraspinous muscle spasm. Will treat with below. Pt aware to report new, worsening, or persistent symptoms. Follow up for reevaluation in 4 weeks, sooner if warranted.  -     predniSONE (DELTASONE) 20 MG tablet; Take 2 tablets (40 mg total) by mouth daily with breakfast for 5 days. 2 po daily for 5 days     Continue all other maintenance medications.  Follow up plan: Return in about 4 weeks (around 03/20/2023), or if symptoms worsen or fail to improve, for back pain, with PCP.   Continue healthy lifestyle choices, including diet (rich in fruits, vegetables, and lean proteins, and low in salt and simple carbohydrates) and exercise (  at least 30 minutes of moderate physical activity daily).  Educational handout given for back pain  The above assessment and management plan was discussed with the patient. The patient verbalized understanding of and has agreed to the management plan. Patient is aware to call the clinic if they develop any new symptoms or if symptoms persist or worsen. Patient is aware when to return to the clinic for a follow-up visit. Patient educated on when it is appropriate to go to the emergency department.   Monia Pouch, FNP-C [facility] [phone]

## 2023-02-22 LAB — URINE CULTURE

## 2023-03-03 ENCOUNTER — Encounter (HOSPITAL_BASED_OUTPATIENT_CLINIC_OR_DEPARTMENT_OTHER): Payer: Self-pay | Admitting: Cardiology

## 2023-03-12 ENCOUNTER — Encounter (HOSPITAL_BASED_OUTPATIENT_CLINIC_OR_DEPARTMENT_OTHER): Payer: Self-pay | Admitting: Pulmonary Disease

## 2023-03-12 ENCOUNTER — Ambulatory Visit (HOSPITAL_BASED_OUTPATIENT_CLINIC_OR_DEPARTMENT_OTHER): Payer: Medicare Other | Admitting: Pulmonary Disease

## 2023-03-12 VITALS — BP 154/64 | HR 94 | Temp 98.2°F | Ht 59.0 in | Wt 212.0 lb

## 2023-03-12 DIAGNOSIS — R0609 Other forms of dyspnea: Secondary | ICD-10-CM

## 2023-03-12 DIAGNOSIS — G4733 Obstructive sleep apnea (adult) (pediatric): Secondary | ICD-10-CM | POA: Diagnosis not present

## 2023-03-12 NOTE — Assessment & Plan Note (Signed)
We discussed sleep study results that showed severe OSA.  We will initiate auto CPAP 5 to 15 cm with nasal mask.  I expect that this will improve her daytime somnolence and fatigue symptoms and we will see if dyspnea is a fatigue equivalent If symptoms persist or if she is unable to tolerate, then we can proceed with formal attended titration study  Weight loss encouraged, compliance with goal of at least 4-6 hrs every night is the expectation. Advised against medications with sedative side effects Cautioned against driving when sleepy - understanding that sleepiness will vary on a day to day basis

## 2023-03-12 NOTE — Patient Instructions (Addendum)
  X schedule pFTs  X Rx for autoCPAP 5-15 cm, nasal mask  to DME

## 2023-03-12 NOTE — Progress Notes (Signed)
   Subjective:    Patient ID: Robin Lindsey, female    DOB: 1946/11/28, 77 y.o.   MRN: [id number]  HPI 77 yo remote smoker from [place], for FU of shortness of breath, ongoing for 5 years, progressive.  She quit smoking 50 years ago, smoked less than 10 pack years  Etiology was not clear on initial OV 01/24/23.  She is a remote smoker less than 10 pack years-smoking history is not significant.  There is no obvious ILD, no evidence of pulmonary hypertension on previous echo from 2020.   We obtain high-resolution CT chest with did not show any evidence of ILD.  She was seen by cardiology and treadmill stress test and repeat echo have been scheduled. She underwent sleep testing which showed severe OSA, we discussed the results today   Significant tests/ events reviewed   HST 02/2023 >> wt 208 lbs -AHI 54/hour, lowest desaturation 74%  05/2019 echocardiogram -normal LVEF, impaired relaxation, mitral valve thickening and calcification without stenosis  HRCT chest 01/2023 >> Mild patchy air trapping in both lungs, No ILD, Mild patchy tree-in-bud type tiny pulmonary nodules at the periphery of the right lower lobe, Scattered mild cylindrical bronchiolectasis in the right middle lobe and left upper lobe 05/2019 CT coronaries -right middle lobe calcified granuloma  Review of Systems neg for any significant sore throat, dysphagia, itching, sneezing, nasal congestion or excess/ purulent secretions, fever, chills, sweats, unintended wt loss, pleuritic or exertional cp, hempoptysis, orthopnea pnd or change in chronic leg swelling. Also denies presyncope, palpitations, heartburn, abdominal pain, nausea, vomiting, diarrhea or change in bowel or urinary habits, dysuria,hematuria, rash, arthralgias, visual complaints, headache, numbness weakness or ataxia.     Objective:   Physical Exam  Gen. Pleasant, obese, in no distress ENT - no lesions, no post nasal drip Neck: No JVD, no thyromegaly, no carotid  bruits Lungs: no use of accessory muscles, no dullness to percussion, decreased without rales or rhonchi  Cardiovascular: Rhythm regular, heart sounds  normal, no murmurs or gallops, no peripheral edema Musculoskeletal: No deformities, no cyanosis or clubbing , no tremors         Assessment & Plan:

## 2023-03-12 NOTE — Assessment & Plan Note (Signed)
No evidence of ILD on high-resolution CT chest.  I am not impressed with the other findings of minimal bronchiectasis and lung nodules.  They do not point towards any infection/inflammation. Will await results of echocardiogram and treadmill stress test If these are negative, we can proceed with PFTs

## 2023-03-13 ENCOUNTER — Ambulatory Visit (INDEPENDENT_AMBULATORY_CARE_PROVIDER_SITE_OTHER): Payer: Medicare Other

## 2023-03-13 DIAGNOSIS — Z1231 Encounter for screening mammogram for malignant neoplasm of breast: Secondary | ICD-10-CM | POA: Diagnosis not present

## 2023-03-13 DIAGNOSIS — G4733 Obstructive sleep apnea (adult) (pediatric): Secondary | ICD-10-CM

## 2023-03-14 ENCOUNTER — Ambulatory Visit (INDEPENDENT_AMBULATORY_CARE_PROVIDER_SITE_OTHER): Payer: Medicare Other

## 2023-03-14 ENCOUNTER — Telehealth (HOSPITAL_BASED_OUTPATIENT_CLINIC_OR_DEPARTMENT_OTHER): Payer: Self-pay

## 2023-03-14 DIAGNOSIS — R0609 Other forms of dyspnea: Secondary | ICD-10-CM

## 2023-03-14 LAB — ECHOCARDIOGRAM COMPLETE
Area-P 1/2: 3.53 cm2
MV M vel: 5.06 m/s
MV Peak grad: 102.4 mmHg
S' Lateral: 3.73 cm

## 2023-03-14 NOTE — Telephone Encounter (Signed)
Received the following message via secure chat from Lissa Merlin,   "Robin Lindsey is saying that the cholesterol medicine Dr. Harrell Gave recently prescribed gave her severe stomach pain when she took it. She tried to half it and take it with meals and it had the same stomach discomfort. She stopped taking it and would like to know is there an alternative? "   Advised patient to stay off medication until alternative prescribed to ensure symptoms subside before starting new medication.

## 2023-03-15 NOTE — Telephone Encounter (Signed)
Call to address rosuvastatin S/E concern. N/A LVM

## 2023-03-18 DIAGNOSIS — G4733 Obstructive sleep apnea (adult) (pediatric): Secondary | ICD-10-CM | POA: Diagnosis not present

## 2023-03-19 NOTE — Telephone Encounter (Signed)
LMOM for patient to return call.

## 2023-03-20 ENCOUNTER — Telehealth: Payer: Self-pay | Admitting: Pharmacist Clinician (PhC)/ Clinical Pharmacy Specialist

## 2023-03-20 MED ORDER — PRAVASTATIN SODIUM 10 MG PO TABS: 10.0000 mg | ORAL_TABLET | Freq: Every day | ORAL | 3 refills | Status: DC

## 2023-03-20 NOTE — Telephone Encounter (Signed)
See other phone message  

## 2023-03-20 NOTE — Addendum Note (Signed)
Addended by: Rosalee Kaufman on: 03/20/2023 02:42 PM   Modules accepted: Orders

## 2023-03-20 NOTE — Telephone Encounter (Signed)
Patient is returning call in regards to addressing the rosuvastatin S/E concern.

## 2023-03-20 NOTE — Telephone Encounter (Signed)
Returned call to patient - states that calcium is hard on her stomach and has not tolerated the rosuvastatin tablets.    Will switch to pravastatin 10 mg once daily.  Advised to take in the evenings, and may need to increase to 20 mg to achieve LDL goals.

## 2023-03-21 ENCOUNTER — Ambulatory Visit: Payer: Medicare Other | Attending: Cardiology

## 2023-03-21 DIAGNOSIS — R0609 Other forms of dyspnea: Secondary | ICD-10-CM | POA: Diagnosis not present

## 2023-03-21 LAB — EXERCISE TOLERANCE TEST
Angina Index: 0
Duke Treadmill Score: 5
Estimated workload: 4.8
Exercise duration (min): 5 min
Exercise duration (sec): 0 s
MPHR: 141 {beats}/min
Peak HR: 129 {beats}/min
Percent HR: 91 %
RPE: 17
Rest HR: 69 {beats}/min
ST Depression (mm): 0 mm

## 2023-03-22 ENCOUNTER — Ambulatory Visit (INDEPENDENT_AMBULATORY_CARE_PROVIDER_SITE_OTHER): Payer: Medicare Other | Admitting: Family

## 2023-03-22 ENCOUNTER — Encounter: Payer: Self-pay | Admitting: Family

## 2023-03-22 VITALS — BP 134/64 | HR 77 | Temp 97.8°F | Ht 59.0 in | Wt 210.0 lb

## 2023-03-22 DIAGNOSIS — E1169 Type 2 diabetes mellitus with other specified complication: Secondary | ICD-10-CM

## 2023-03-22 DIAGNOSIS — G629 Polyneuropathy, unspecified: Secondary | ICD-10-CM | POA: Diagnosis not present

## 2023-03-22 DIAGNOSIS — G4733 Obstructive sleep apnea (adult) (pediatric): Secondary | ICD-10-CM

## 2023-03-22 DIAGNOSIS — I152 Hypertension secondary to endocrine disorders: Secondary | ICD-10-CM

## 2023-03-22 DIAGNOSIS — R0609 Other forms of dyspnea: Secondary | ICD-10-CM | POA: Diagnosis not present

## 2023-03-22 DIAGNOSIS — M25532 Pain in left wrist: Secondary | ICD-10-CM | POA: Diagnosis not present

## 2023-03-22 DIAGNOSIS — F411 Generalized anxiety disorder: Secondary | ICD-10-CM

## 2023-03-22 DIAGNOSIS — M791 Myalgia, unspecified site: Secondary | ICD-10-CM | POA: Diagnosis not present

## 2023-03-22 DIAGNOSIS — E1159 Type 2 diabetes mellitus with other circulatory complications: Secondary | ICD-10-CM

## 2023-03-22 DIAGNOSIS — K219 Gastro-esophageal reflux disease without esophagitis: Secondary | ICD-10-CM

## 2023-03-22 DIAGNOSIS — T466X5A Adverse effect of antihyperlipidemic and antiarteriosclerotic drugs, initial encounter: Secondary | ICD-10-CM

## 2023-03-22 DIAGNOSIS — E559 Vitamin D deficiency, unspecified: Secondary | ICD-10-CM

## 2023-03-22 DIAGNOSIS — Z6841 Body Mass Index (BMI) 40.0 and over, adult: Secondary | ICD-10-CM

## 2023-03-22 MED ORDER — GABAPENTIN 100 MG PO CAPS: 100.0000 mg | ORAL_CAPSULE | Freq: Three times a day (TID) | ORAL | 3 refills | Status: DC

## 2023-03-22 MED ORDER — ALPRAZOLAM 0.25 MG PO TABS: 0.2500 mg | ORAL_TABLET | Freq: Every evening | ORAL | 0 refills | Status: DC | PRN

## 2023-03-22 NOTE — Patient Instructions (Signed)
Peripheral Neuropathy Peripheral neuropathy is a type of nerve damage. It affects nerves that carry signals between the spinal cord and the arms, legs, and the rest of the body (peripheral nerves). It does not affect nerves in the spinal cord or brain. In peripheral neuropathy, one nerve or a group of nerves may be damaged. Peripheral neuropathy is a broad category that includes many specific nerve disorders, like diabetic neuropathy, hereditary neuropathy, and carpal tunnel syndrome. What are the causes? This condition may be caused by: Certain diseases, such as: Diabetes. This is the most common cause of peripheral neuropathy. Autoimmune diseases, such as rheumatoid arthritis and systemic lupus erythematosus. Nerve diseases that are passed from parent to child (inherited). Kidney disease. Thyroid disease. Other causes may include: Nerve injury. Pressure or stress on a nerve that lasts a long time. Lack (deficiency) of B vitamins. This can result from alcoholism, poor diet, or a restricted diet. Infections. Some medicines, such as cancer medicines (chemotherapy). Poisonous (toxic) substances, such as lead and mercury. Too little blood flowing to the legs. In some cases, the cause of this condition is not known. What are the signs or symptoms? Symptoms of this condition depend on which of your nerves is damaged. Symptoms in the legs, hands, and arms can include: Loss of feeling (numbness) in the feet, hands, or both. Tingling in the feet, hands, or both. Burning pain. Very sensitive skin. Weakness. Not being able to move a part of the body (paralysis). Clumsiness or poor coordination. Muscle twitching. Loss of balance. Symptoms in other parts of the body can include: Not being able to control your bladder. Feeling dizzy. Sexual problems. How is this diagnosed? Diagnosing and finding the cause of peripheral neuropathy can be difficult. Your health care provider will take your  medical history and do a physical exam. A neurological exam will also be done. This involves checking things that are affected by your brain, spinal cord, and nerves (nervous system). For example, your health care provider will check your reflexes, how you move, and what you can feel. You may have other tests, such as: Blood tests. Electromyogram (EMG) and nerve conduction tests. These tests check nerve function and how well the nerves are controlling the muscles. Imaging tests, such as a CT scan or MRI, to rule out other causes of your symptoms. Removing a small piece of nerve to be examined in a lab (nerve biopsy). Removing and examining a small amount of the fluid that surrounds the brain and spinal cord (lumbar puncture). How is this treated? Treatment for this condition may involve: Treating the underlying cause of the neuropathy, such as diabetes, kidney disease, or vitamin deficiencies. Stopping medicines that can cause neuropathy, such as chemotherapy. Medicine to help relieve pain. Medicines may include: Prescription or over-the-counter pain medicine. Anti-seizure medicine. Antidepressants. Pain-relieving patches that are applied to painful areas of skin. Surgery to relieve pressure on a nerve or to destroy a nerve that is causing pain. Physical therapy to help improve movement and balance. Devices to help you move around (assistive devices). Follow these instructions at home: Medicines Take over-the-counter and prescription medicines only as told by your health care provider. Do not take any other medicines without first asking your health care provider. Ask your health care provider if the medicine prescribed to you requires you to avoid driving or using machinery. Lifestyle  Do not use any products that contain nicotine or tobacco. These products include cigarettes, chewing tobacco, and vaping devices, such as e-cigarettes. Smoking keeps   blood from reaching damaged nerves. If you  need help quitting, ask your health care provider. Avoid or limit alcohol. Too much alcohol can cause a vitamin B deficiency, and vitamin B is needed for healthy nerves. Eat a healthy diet. This includes: Eating foods that are high in fiber, such as beans, whole grains, and fresh fruits and vegetables. Limiting foods that are high in fat and processed sugars, such as fried or sweet foods. General instructions  If you have diabetes, work closely with your health care provider to keep your blood sugar under control. If you have numbness in your feet: Check every day for signs of injury or infection. Watch for redness, warmth, and swelling. Wear padded socks and comfortable shoes. These help protect your feet. Develop a good support system. Living with peripheral neuropathy can be stressful. Consider talking with a mental health specialist or joining a support group. Use assistive devices and attend physical therapy as told by your health care provider. This may include using a walker or a cane. Keep all follow-up visits. This is important. Where to find more information National Institute of Neurological Disorders: www.ninds.nih.gov Contact a health care provider if: You have new signs or symptoms of peripheral neuropathy. You are struggling emotionally from dealing with peripheral neuropathy. Your pain is not well controlled. Get help right away if: You have an injury or infection that is not healing normally. You develop new weakness in an arm or leg. You have fallen or do so frequently. Summary Peripheral neuropathy is when the nerves in the arms or legs are damaged, resulting in numbness, weakness, or pain. There are many causes of peripheral neuropathy, including diabetes, pinched nerves, vitamin deficiencies, autoimmune disease, and hereditary conditions. Diagnosing and finding the cause of peripheral neuropathy can be difficult. Your health care provider will take your medical  history, do a physical exam, and do tests, including blood tests and nerve function tests. Treatment involves treating the underlying cause of the neuropathy and taking medicines to help control pain. Physical therapy and assistive devices may also help. This information is not intended to replace advice given to you by your health care provider. Make sure you discuss any questions you have with your health care provider. Document Revised: 08/01/2021 Document Reviewed: 08/01/2021 Elsevier Patient Education  2023 [employer]  

## 2023-03-22 NOTE — Progress Notes (Signed)
Subjective:    Patient ID: Robin Lindsey, female    DOB: Dec 17, 1945, 77 y.o.   MRN: [id number]  Chief Complaint  Patient presents with   Back Pain    ARTHRITIS WANTS SOMETHING FOR IT.   Sleep Apnea    ON CPAP IS HAVING ANXIETY ABOUT IT BECAUSE SHE FEELS STRAPPED DOWN WITH IT WANTS A LOW DOSE XANAX    Arthritis   Wrist Pain    LEFT WRIST SINCE CHRISTMAS    Pt presents to the office today for chronic follow up. She had COVID on 05/26/21 and states she still has SOB at times with this.  She is followed by Pulmonologist's and diagnosed with OSA. She started this week. She reports she is having a lot of anxiety with this and having a hard time adjusting.    She saw Cardiologists and had stress test completed that showed 42%.   She is morbid obese with a 42.    PT complaining of burning, tingling in bilateral feet that is constant. Reports 7-8 out 10. Back Pain This is a recurrent problem. The current episode started more than 1 month ago. The problem occurs intermittently. The problem has been waxing and waning since onset. The pain is present in the thoracic spine (left). The quality of the pain is described as burning. The pain is at a severity of 4/10. The pain is moderate. The symptoms are aggravated by twisting. Treatments tried: prednisone.  Arthritis Presents for follow-up visit. She complains of pain and stiffness. Affected locations include the left wrist, right wrist, right knee, left knee, right MCP and left MCP. Her pain is at a severity of 6/10.  Wrist Pain  Pain location: left wrist. This is a new problem. The current episode started more than 1 month ago. There has been no history of extremity trauma. The problem occurs constantly. The problem has been unchanged. The quality of the pain is described as aching. The pain is at a severity of 8/10. The pain is moderate. Associated symptoms include stiffness. She has tried OTC ointments and rest (splint) for the symptoms. The  treatment provided mild relief.  Hypertension This is a chronic problem. The current episode started more than 1 year ago. The problem has been resolved since onset. The problem is controlled. Associated symptoms include anxiety, malaise/fatigue and shortness of breath. Pertinent negatives include no blurred vision or peripheral edema. Risk factors for coronary artery disease include dyslipidemia and obesity. The current treatment provides moderate improvement.  Gastroesophageal Reflux She complains of belching and heartburn. This is a chronic problem. The current episode started more than 1 year ago. The problem occurs occasionally. She has tried a PPI for the symptoms. The treatment provided moderate relief.  Diabetes She presents for her follow-up diabetic visit. She has type 2 diabetes mellitus. Hypoglycemia symptoms include nervousness/anxiousness. Pertinent negatives for diabetes include no blurred vision and no foot paresthesias. Symptoms are stable. Risk factors for coronary artery disease include dyslipidemia, diabetes mellitus, hypertension and sedentary lifestyle. She is following a generally healthy diet.  Anxiety Presents for follow-up visit. Symptoms include excessive worry, nervous/anxious behavior and shortness of breath. Symptoms occur occasionally. The severity of symptoms is moderate.        Review of Systems  Constitutional:  Positive for malaise/fatigue.  Eyes:  Negative for blurred vision.  Respiratory:  Positive for shortness of breath.   Gastrointestinal:  Positive for heartburn.  Musculoskeletal:  Positive for arthritis, back pain and stiffness.  Psychiatric/Behavioral:  The patient is nervous/anxious.   All other systems reviewed and are negative.      Objective:   Physical Exam Vitals reviewed.  Constitutional:      General: She is not in acute distress.    Appearance: She is well-developed. She is obese.  HENT:     Head: Normocephalic and atraumatic.      Right Ear: Tympanic membrane normal.     Left Ear: Tympanic membrane normal.  Eyes:     Pupils: Pupils are equal, round, and reactive to light.  Neck:     Thyroid: No thyromegaly.  Cardiovascular:     Rate and Rhythm: Normal rate and regular rhythm.     Heart sounds: Normal heart sounds. No murmur heard. Pulmonary:     Effort: Pulmonary effort is normal. No respiratory distress.     Breath sounds: Normal breath sounds. No wheezing.  Abdominal:     General: Bowel sounds are normal. There is no distension.     Palpations: Abdomen is soft.     Tenderness: There is no abdominal tenderness.  Musculoskeletal:        General: No tenderness.     Cervical back: Normal range of motion and neck supple.     Comments: Pain in left wrist with flexion and extension  Skin:    General: Skin is warm and dry.  Neurological:     Mental Status: She is alert and oriented to person, place, and time.     Cranial Nerves: No cranial nerve deficit.     Deep Tendon Reflexes: Reflexes are normal and symmetric.  Psychiatric:        Behavior: Behavior normal.        Thought Content: Thought content normal.        Judgment: Judgment normal.          BP 134/64   Pulse 77   Temp 97.8 F (36.6 C) (Temporal)   Ht  (1.499 m)   Wt 210 lb (95.3 kg)   SpO2 95%   BMI 42.41 kg/m   Assessment & Plan:  Robin Lindsey comes in today with chief complaint of Back Pain (ARTHRITIS WANTS SOMETHING FOR IT.), Sleep Apnea (ON CPAP IS HAVING ANXIETY ABOUT IT BECAUSE SHE FEELS STRAPPED DOWN WITH IT WANTS A LOW DOSE XANAX ), Arthritis, and Wrist Pain (LEFT WRIST SINCE CHRISTMAS )   Diagnosis and orders addressed:  1. Hypertension associated with diabetes  2. Gastroesophageal reflux disease, unspecified whether esophagitis present  3. Vitamin D deficiency  4. Type 2 diabetes mellitus with other specified complication, without long-term current use of insulin  5. GAD (generalized anxiety disorder) Will  given short term xanax to help to adjust for CPAP, discussed this is not longer term and not for every night, just when she is anxious  - ALPRAZolam (XANAX) 0.25 MG tablet; Take 1 tablet (0.25 mg total) by mouth at bedtime as needed for anxiety.  Dispense: 20 tablet; Refill: 0  6. Morbid obesity  7. Myalgia due to statin  8. DOE (dyspnea on exertion)  9. OSA (obstructive sleep apnea)  10. Peripheral polyneuropathy - gabapentin (NEURONTIN) 100 MG capsule; Take 1 capsule (100 mg total) by mouth 3 (three) times daily.  Dispense: 90 capsule; Refill: 3  11. Left wrist pain - Ambulatory referral to Hand Surgery   Labs pending Health Maintenance reviewed Diet and exercise encouraged  Follow up plan: Keep chronic follow up   [name], FNP

## 2023-03-25 ENCOUNTER — Telehealth: Payer: Self-pay

## 2023-03-25 NOTE — Telephone Encounter (Signed)
Prolia VOB initiated via [email or web address]  Last Prolia inj: 10/04/22 Next Prolia inj DUE:  03/2023

## 2023-03-26 ENCOUNTER — Encounter: Payer: Self-pay | Admitting: Family

## 2023-03-26 ENCOUNTER — Other Ambulatory Visit (HOSPITAL_COMMUNITY): Payer: Self-pay

## 2023-03-26 NOTE — Telephone Encounter (Signed)
Pt ready for scheduling for Prolia on or after : 03/30/23  Out-of-pocket cost due at time of visit: $322  Primary: [employer] Medicare Prolia co-insurance: 20% Admin fee co-insurance: $20  Secondary: --- Prolia co-insurance:  Admin fee co-insurance:   Medical Benefit Details: Date Benefits were checked: 03/25/23 Deductible: NO/ Coinsurance: 20%/ Admin Fee: $20  Prior Auth: APPROVED PA# [id number] Expiration Date: 03/25/24   Pharmacy benefit: Copay $300 If patient wants fill through the pharmacy benefit please send prescription to: [facility], and include estimated need by date in rx notes. Pharmacy will ship medication directly to the office.  Patient NOT eligible for Prolia Copay Card. Copay Card can make patient's cost as little as $25. Link to apply: [email or web address]  ** This summary of benefits is an estimation of the patient's out-of-pocket cost. Exact cost may very based on individual plan coverage.

## 2023-04-02 ENCOUNTER — Encounter (HOSPITAL_BASED_OUTPATIENT_CLINIC_OR_DEPARTMENT_OTHER): Payer: Self-pay

## 2023-04-02 NOTE — Telephone Encounter (Signed)
Please review echo and advise for any changes

## 2023-04-04 ENCOUNTER — Ambulatory Visit: Payer: Medicare Other

## 2023-04-04 DIAGNOSIS — M810 Age-related osteoporosis without current pathological fracture: Secondary | ICD-10-CM

## 2023-04-04 MED ORDER — DENOSUMAB 60 MG/ML ~~LOC~~ SOSY
60.0000 mg | PREFILLED_SYRINGE | Freq: Once | SUBCUTANEOUS | Status: DC
Start: 2023-04-04 — End: 2024-02-17

## 2023-04-04 NOTE — Telephone Encounter (Signed)
Patient received 04/04/23

## 2023-04-08 DIAGNOSIS — G4733 Obstructive sleep apnea (adult) (pediatric): Secondary | ICD-10-CM | POA: Diagnosis not present

## 2023-04-17 DIAGNOSIS — G4733 Obstructive sleep apnea (adult) (pediatric): Secondary | ICD-10-CM | POA: Diagnosis not present

## 2023-04-19 DIAGNOSIS — G4733 Obstructive sleep apnea (adult) (pediatric): Secondary | ICD-10-CM | POA: Diagnosis not present

## 2023-04-22 DIAGNOSIS — M65331 Trigger finger, right middle finger: Secondary | ICD-10-CM | POA: Diagnosis not present

## 2023-04-22 DIAGNOSIS — M65341 Trigger finger, right ring finger: Secondary | ICD-10-CM | POA: Diagnosis not present

## 2023-04-22 DIAGNOSIS — M654 Radial styloid tenosynovitis [de Quervain]: Secondary | ICD-10-CM | POA: Diagnosis not present

## 2023-04-22 DIAGNOSIS — M25532 Pain in left wrist: Secondary | ICD-10-CM | POA: Diagnosis not present

## 2023-05-12 NOTE — Telephone Encounter (Signed)
Recommend office visit to assess for symptoms and discuss optimizing GDMT (guideline directed therapy) for reduced LVEF.   Alver Sorrow, NP

## 2023-05-13 ENCOUNTER — Ambulatory Visit (HOSPITAL_BASED_OUTPATIENT_CLINIC_OR_DEPARTMENT_OTHER): Payer: Medicare Other | Admitting: Pulmonary Disease

## 2023-05-13 ENCOUNTER — Encounter (HOSPITAL_BASED_OUTPATIENT_CLINIC_OR_DEPARTMENT_OTHER): Payer: Medicare Other

## 2023-05-13 ENCOUNTER — Encounter (HOSPITAL_BASED_OUTPATIENT_CLINIC_OR_DEPARTMENT_OTHER): Payer: Self-pay | Admitting: Pulmonary Disease

## 2023-05-13 VITALS — BP 136/68 | HR 78 | Temp 98.3°F | Ht 59.0 in | Wt 210.4 lb

## 2023-05-13 DIAGNOSIS — R0609 Other forms of dyspnea: Secondary | ICD-10-CM | POA: Diagnosis not present

## 2023-05-13 DIAGNOSIS — G4733 Obstructive sleep apnea (adult) (pediatric): Secondary | ICD-10-CM

## 2023-05-13 NOTE — Assessment & Plan Note (Signed)
Seems to be related to a new drop in EF to 42%. Discussed with cardiology and will try to expedite her appointment.  She is on ARB already but will need other goal-directed therapy and further workup

## 2023-05-13 NOTE — H&P (View-Only) (Signed)
   Subjective:    Patient ID: Robin Lindsey, female    DOB: 05/28/1946, 77 y.o.   MRN: [id number]  HPI  77 yo remote smoker from [place], for FU of OSA &  shortness of breath, ongoing for 5 years, progressive.  She quit smoking 50 years ago, smoked less than 10 pack years  Etiology was not clear on initial OV 01/24/23.  She is a remote smoker less than 10 pack years-smoking history is not significant.  There is no obvious ILD, no evidence of pulmonary hypertension on previous echo from 2020.   2-month follow-up visit. Last office visit we discussed sleep study and started her on CPAP.  She is adjusting well to nasal mask.  She reports slight increase in energy levels.  She needed some Xanax initially when she started her [place] is now able to adjust and wear her CPAP through the night.  She reports dryness in the mornings. Her shortness of breath persists.  We reviewed echocardiogram and treadmill stress test results.  There is a drop in LVEF to 42%.  She has not been contacted by her cardiologist yet She was able to achieve maximum heart rate after 5 minutes on treadmill, no EKG changes  Significant tests/ events reviewed     HST 02/2023 >> wt 208 lbs -AHI 54/hour, lowest desaturation 74%   Echo 03/2023 EF dropped to 42% 05/2019 echocardiogram -normal LVEF, impaired relaxation, mitral valve thickening and calcification without stenosis   HRCT chest 01/2023 >> Mild patchy air trapping in both lungs, No ILD, Mild patchy tree-in-bud type tiny pulmonary nodules at the periphery of the right lower lobe, Scattered mild cylindrical bronchiolectasis in the right middle lobe and left upper lobe 05/2019 CT coronaries -right middle lobe calcified granuloma  Review of Systems neg for any significant sore throat, dysphagia, itching, sneezing, nasal congestion or excess/ purulent secretions, fever, chills, sweats, unintended wt loss, pleuritic or exertional cp, hempoptysis, orthopnea pnd or change in  chronic leg swelling. Also denies presyncope, palpitations, heartburn, abdominal pain, nausea, vomiting, diarrhea or change in bowel or urinary habits, dysuria,hematuria, rash, arthralgias, visual complaints, headache, numbness weakness or ataxia.     Objective:   Physical Exam  Gen. Pleasant, obese, in no distress ENT - no lesions, no post nasal drip Neck: No JVD, no thyromegaly, no carotid bruits Lungs: no use of accessory muscles, no dullness to percussion, decreased without rales or rhonchi  Cardiovascular: Rhythm regular, heart sounds  normal, no murmurs or gallops, no peripheral edema Musculoskeletal: No deformities, no cyanosis or clubbing , no tremors       Assessment & Plan:   

## 2023-05-13 NOTE — Patient Instructions (Signed)
CPAP is working well. X Change to auto CPAP settings 10 to 15 cm  I will speak to your cardiologist and expedite your appointment for drop and heart pump function to 40%  Await breathing test

## 2023-05-13 NOTE — Assessment & Plan Note (Signed)
CPAP download shows excellent compliance, average pressure of 14 cm, on auto CPAP settings, residual HI 5.4/hour with some nights events going as high as 10/hour. Will change to auto CPAP settings 10 to 15 cm  Weight loss encouraged, compliance with goal of at least 4-6 hrs every night is the expectation. Advised against medications with sedative side effects Cautioned against driving when sleepy - understanding that sleepiness will vary on a day to day basis

## 2023-05-13 NOTE — Progress Notes (Signed)
   Subjective:    Patient ID: Robin Lindsey, female    DOB: July 10, 1946, 77 y.o.   MRN: [id number]  HPI  77 yo remote smoker from [place], for FU of OSA &  shortness of breath, ongoing for 5 years, progressive.  She quit smoking 50 years ago, smoked less than 10 pack years  Etiology was not clear on initial OV 01/24/23.  She is a remote smoker less than 10 pack years-smoking history is not significant.  There is no obvious ILD, no evidence of pulmonary hypertension on previous echo from 2020.   43-month follow-up visit. Last office visit we discussed sleep study and started her on CPAP.  She is adjusting well to nasal mask.  She reports slight increase in energy levels.  She needed some Xanax initially when she started her [place] is now able to adjust and wear her CPAP through the night.  She reports dryness in the mornings. Her shortness of breath persists.  We reviewed echocardiogram and treadmill stress test results.  There is a drop in LVEF to 42%.  She has not been contacted by her cardiologist yet She was able to achieve maximum heart rate after 5 minutes on treadmill, no EKG changes  Significant tests/ events reviewed     HST 02/2023 >> wt 208 lbs -AHI 54/hour, lowest desaturation 74%   Echo 03/2023 EF dropped to 42% 05/2019 echocardiogram -normal LVEF, impaired relaxation, mitral valve thickening and calcification without stenosis   HRCT chest 01/2023 >> Mild patchy air trapping in both lungs, No ILD, Mild patchy tree-in-bud type tiny pulmonary nodules at the periphery of the right lower lobe, Scattered mild cylindrical bronchiolectasis in the right middle lobe and left upper lobe 05/2019 CT coronaries -right middle lobe calcified granuloma  Review of Systems neg for any significant sore throat, dysphagia, itching, sneezing, nasal congestion or excess/ purulent secretions, fever, chills, sweats, unintended wt loss, pleuritic or exertional cp, hempoptysis, orthopnea pnd or change in  chronic leg swelling. Also denies presyncope, palpitations, heartburn, abdominal pain, nausea, vomiting, diarrhea or change in bowel or urinary habits, dysuria,hematuria, rash, arthralgias, visual complaints, headache, numbness weakness or ataxia.     Objective:   Physical Exam  Gen. Pleasant, obese, in no distress ENT - no lesions, no post nasal drip Neck: No JVD, no thyromegaly, no carotid bruits Lungs: no use of accessory muscles, no dullness to percussion, decreased without rales or rhonchi  Cardiovascular: Rhythm regular, heart sounds  normal, no murmurs or gallops, no peripheral edema Musculoskeletal: No deformities, no cyanosis or clubbing , no tremors       Assessment & Plan:

## 2023-05-17 ENCOUNTER — Encounter (HOSPITAL_BASED_OUTPATIENT_CLINIC_OR_DEPARTMENT_OTHER): Payer: Self-pay | Admitting: Cardiology

## 2023-05-17 ENCOUNTER — Ambulatory Visit (HOSPITAL_BASED_OUTPATIENT_CLINIC_OR_DEPARTMENT_OTHER): Payer: Medicare Other | Admitting: Cardiology

## 2023-05-17 VITALS — BP 132/64 | HR 72 | Ht 59.0 in | Wt 210.0 lb

## 2023-05-17 DIAGNOSIS — E7800 Pure hypercholesterolemia, unspecified: Secondary | ICD-10-CM

## 2023-05-17 DIAGNOSIS — I10 Essential (primary) hypertension: Secondary | ICD-10-CM | POA: Diagnosis not present

## 2023-05-17 DIAGNOSIS — R0609 Other forms of dyspnea: Secondary | ICD-10-CM

## 2023-05-17 DIAGNOSIS — E1169 Type 2 diabetes mellitus with other specified complication: Secondary | ICD-10-CM | POA: Diagnosis not present

## 2023-05-17 DIAGNOSIS — I429 Cardiomyopathy, unspecified: Secondary | ICD-10-CM | POA: Diagnosis not present

## 2023-05-17 DIAGNOSIS — I2510 Atherosclerotic heart disease of native coronary artery without angina pectoris: Secondary | ICD-10-CM | POA: Diagnosis not present

## 2023-05-17 MED ORDER — PRAVASTATIN SODIUM 10 MG PO TABS: 10.0000 mg | ORAL_TABLET | Freq: Every day | ORAL | 3 refills | Status: DC

## 2023-05-17 MED ORDER — METOPROLOL SUCCINATE ER 25 MG PO TB24
25.0000 mg | ORAL_TABLET | Freq: Every day | ORAL | 3 refills | Status: DC
Start: 2023-05-17 — End: 2023-06-10

## 2023-05-17 NOTE — Progress Notes (Signed)
Cardiology Office Note:    Date:  05/17/2023   ID:  Robin Lindsey, DOB [birth year], MRN [id number]  PCP:  Junie Spencer, FNP  Cardiologist:  Jodelle Red, MD  Referring MD: Junie Spencer, FNP   CC:  follow up  History of Present Illness:    Robin Lindsey is a 77 y.o. female with a hx of cardiomyopathy diagnosed 2024, hypertension, diabetes, glaucoma, GERD, arthritis, and osteoporosis, who is seen for follow up today. I initially met her 02/18/23 as a new consult at the request of Junie Spencer, FNP for the evaluation and management of dyspnea on exertion.  Cardiovascular History: Prior clinical ASCVD:  None. Comorbid conditions:  Hypertension - for a long time, at least since her late 47's. At one time she was told she had diabetes, but was intolerant of metformin. She had managed with dietary changes. Most recent A1C of 6.6. Last year she was told her cholesterol was elevated; she deferred statin therapy previously due to already having myalgias and never tried one. She complains of arm and wrist stiffness. She had multiple family members who developed myalgias on statin therapy. Metabolic syndrome/Obesity: Current weight is 210 lbs. Chronic inflammatory conditions:  None. She endorses a lot of generalized inflammation and joint pain. Previously she had been taking meloxicam for 1 year. Tobacco use history: Former smoker. Quit smoking when she was 77 yo (had smoked less than 10 pack years). Family history: Her mother died of pulmonary fibrosis at age 36. Her father had a valve replaced and had a stroke. Her paternal grandmother had a stroke. Prior cardiac testing and/or incidental findings on other testing (ie coronary calcium): Previously followed by Dr. Purvis Sheffield, initially seen in 2015 for chest pressure. Last seen by him 07/31/2019. They discussed her workup for DOE. Coronary CT angiography 05/2019 showed minimal nonobstructive coronary artery disease. Calcium score was 49  which was in the 50th percentile. Echocardiogram demonstrated hyperdynamic systolic function with an LVEF greater than 65%.  There was grade 1 diastolic dysfunction. No further cardiac investigations were indicated at that time. Nothing on physical exam to suggest pulmonary fibrosis.  Exercise level:  When she tries to walk she develops central chest pressure and becomes very short winded. This has been an ongoing issue for about 5-6 years. Lately she believes her symptoms are progressing as she frequently needs to stop and rest. Additionally she is limited by her feet, which she states is related to neuropathy. She tries to exercise by walking on a 0.5 mile track, able to complete about 1/4 of this without needing to sit down. Needs to rest at least twice in order to complete the 1/2 mile. Of note, the track is not all level ground. While going shopping she is unable to walk very fast. She denies any syncopal episodes.  Today: Recent echo on 03/14/23 showed 3D EF 42% without regional wall abnormalities. She then had an ETT where she went 4.8 METs without ischemic changes.  Reviewed test results. No chest pain. Some chest tightness when her breathing is very short.   We discussed ischemic evaluation at length today. We reviewed her CT in 2020, which was stressful for her. Discussed cath as well.   Past Medical History:  Diagnosis Date   Arthritis    Cataract    Diabetes mellitus without complication (HCC)    pt is not on diabetic medication   GERD (gastroesophageal reflux disease)    Glaucoma    worse in left eye  than right eye   Heel pain    right heel pain   Hypertension    Osteoporosis    Personal history of colonic polyps 12/09/2012   Plantar fasciitis    right foot    Past Surgical History:  Procedure Laterality Date   BREAST LUMPECTOMY  10 + yrs.   left breast/benign   CATARACT EXTRACTION W/PHACO  02/14/2012   Procedure: CATARACT EXTRACTION PHACO AND INTRAOCULAR LENS PLACEMENT  (IOC);  Surgeon: Gemma Payor, MD;  Location: [facility];  Service: Ophthalmology;  Laterality: Left;  CDE 13.73   CATARACT EXTRACTION W/PHACO  03/03/2012   Procedure: CATARACT EXTRACTION PHACO AND INTRAOCULAR LENS PLACEMENT (IOC);  Surgeon: Gemma Payor, MD;  Location: [facility];  Service: Ophthalmology;  Laterality: Right;  CDE: 16.49   COLONOSCOPY     percutaneous micrfasciotomy with topaz coblation wand  12/11/2015   rt foot surgery Right     Current Medications: Current Outpatient Medications on File Prior to Visit  Medication Sig   Coenzyme Q10 (CO Q-10) 50 MG CAPS Take 50 mg by mouth daily.   denosumab (PROLIA) 60 MG/ML SOSY injection Inject 60 mg into the skin every 6 (six) months.   LUMIGAN 0.01 % SOLN Place into both eyes daily.    omeprazole (PRILOSEC) 20 MG capsule TAKE ONE (1) CAPSULE BY MOUTH 2 TIMES DAILY   valsartan-hydrochlorothiazide (DIOVAN-HCT) 160-25 MG tablet TAKE ONE (1) TABLET EACH DAY   Current Facility-Administered Medications on File Prior to Visit  Medication   denosumab (PROLIA) injection 60 mg     Allergies:   Metformin and related, Aspirin, and Nsaids   Social History   Tobacco Use   Smoking status: Former    Packs/day: 1.00    Years: 5.00    Additional pack years: 0.00    Total pack years: 5.00    Types: Cigarettes    Start date: 03/10/1962    Quit date: 03/11/1967    Years since quitting: 56.2   Smokeless tobacco: Never   Tobacco comments:    smoked for 4-5 years from age 29- 65 years old  Vaping Use   Vaping Use: Never used  Substance Use Topics   Alcohol use: No    Alcohol/week: 0.0 standard drinks of alcohol   Drug use: No    Family History: family history includes Colon cancer in her maternal aunt; Colon polyps in her mother; Dementia in her father; Diabetes in her brother and mother; Heart disease in her father; Hypertension in her brother; Lung disease in her father; Meniere's disease in her brother; Pulmonary fibrosis in her maternal aunt,  maternal aunt, and mother; Stroke in her father; Thyroid disease in her brother. There is no history of Anesthesia problems, Hypotension, Malignant hyperthermia, or Pseudochol deficiency.  ROS:   Please see the history of present illness.  Additional pertinent ROS otherwise unremarkable.  EKGs/Labs/Other Studies Reviewed:    The following studies were reviewed today: Echo 03/14/23  1. Left ventricular ejection fraction by 3D volume is 42 %. The left  ventricle has mildly decreased function. The left ventricle has no  regional wall motion abnormalities. Left ventricular diastolic parameters  are indeterminate.   2. Right ventricular systolic function is normal. The right ventricular  size is normal.   3. The mitral valve is normal in structure. No evidence of mitral valve  regurgitation. No evidence of mitral stenosis.   4. The aortic valve is normal in structure. Aortic valve regurgitation is  not visualized. Aortic valve sclerosis/calcification  is present, without  any evidence of aortic stenosis.   5. The inferior vena cava is normal in size with greater than 50%  respiratory variability, suggesting right atrial pressure of 3 mmHg.   Comparison(s): A prior study was performed on 05/2019. The left ventricular  function is worsened.   ETT 03/21/23   No ST deviation was noted.   Prior study not available for comparison.   Negative stress test with 4.8 fMETS. Rare PVC in recovery.  CT Chest  01/28/2023: IMPRESSION: 1. Mild patchy air trapping in both lungs, indicative of small airways disease. 2. Scattered mild cylindrical bronchiolectasis in the right middle lobe and left upper lobe. No evidence of interstitial lung disease. 3. Mild patchy tree-in-bud type tiny pulmonary nodules at the periphery of the right lower lobe measuring up to 0.3 cm, new from 05/21/2019 CT, most suggestive of a nonspecific mild infectious or inflammatory bronchiolitis. No follow-up needed if patient  is low-risk (and has no known or suspected primary neoplasm). Non-contrast chest CT can be considered in 12 months if patient is high-risk. This recommendation follows the consensus statement: Guidelines for Management of Incidental Pulmonary Nodules Detected on CT Images: From the Fleischner Society 2017; Radiology 2017; 284:228-243. 4. Three-vessel coronary atherosclerosis. 5. Right adrenal adenoma, for which no follow-up imaging is recommended. 6.  Aortic Atherosclerosis (ICD10-I70.0).  Echo  06/03/2019:  1. The left ventricle has hyperdynamic systolic function, with an  ejection fraction of >65%. The cavity size was normal. There is mildly  increased left ventricular wall thickness. Left ventricular diastolic  Doppler parameters are consistent with  impaired relaxation.   2. The right ventricle has normal systolic function. The cavity was  normal. There is no increase in right ventricular wall thickness.   3. Left atrial size was mildly dilated.   4. The aortic valve is tricuspid. Mild thickening of the aortic valve.  Mild calcification of the aortic valve. No stenosis of the aortic valve.  Mild aortic annular calcification noted.   5. The mitral valve is abnormal. Mild thickening of the mitral valve  leaflet. Mild calcification of the mitral valve leaflet. There is mild  mitral annular calcification present. No evidence of mitral valve  stenosis.   6. The aortic root is normal in size and structure.   7. Pulmonary hypertension is indeterminant, inadequate TR jet.   Coronary CTA  05/21/2019: IMPRESSION: 1. Calcium score 49 which is 50 th percentile for age and sex   2.  CAD RADS one minimal non obstructive CAD see description above   3.  Normal aortic root 3.0 cm  Stress Test  05/12/2014: IMPRESSION:  1. Normal exercise Cardiolite stress test.   2.  No evidence of myocardial ischemia or scar.   3. Normal left ventricular systolic function and regional wall  motion,  calculated LVEF 71%.    EKG:  EKG is personally reviewed.   05/17/2023: NSR at 72 bpm 02/18/2023:  nsr at 78 bpm  Recent Labs: 06/28/2022: Hemoglobin 12.8; Platelets 241 12/28/2022: ALT 13; BUN 15; Creatinine, Ser 0.96; Potassium 4.0; Sodium 137   Recent Lipid Panel    Component Value Date/Time   CHOL 191 06/28/2022 1519   TRIG 152 (H) 06/28/2022 1519   HDL 65 06/28/2022 1519   CHOLHDL 2.9 06/28/2022 1519   CHOLHDL 3.2 04/23/2013 1052   VLDL 18 04/23/2013 1052   LDLCALC 100 (H) 06/28/2022 1519    Physical Exam:    VS:  BP 132/64   Pulse 72  Ht 4\' 11"  (1.499 m)   Wt 210 lb (95.3 kg)   SpO2 96%   BMI 42.41 kg/m     Wt Readings from Last 3 Encounters:  05/17/23 210 lb (95.3 kg)  05/13/23 210 lb 6.4 oz (95.4 kg)  03/22/23 210 lb (95.3 kg)    GEN: Well nourished, well developed in no acute distress HEENT: Normal, moist mucous membranes NECK: No JVD CARDIAC: regular rhythm, normal S1 and S2, no rubs or gallops. No murmur. VASCULAR: Radial and DP pulses 2+ bilaterally. No carotid bruits RESPIRATORY:  Distant but clear to auscultation without rales, wheezing or rhonchi  ABDOMEN: Soft, non-tender, non-distended MUSCULOSKELETAL:  Ambulates independently SKIN: Warm and dry, no edema NEUROLOGIC:  Alert and oriented x 3. No focal neuro deficits noted. PSYCHIATRIC:  Normal affect    ASSESSMENT:    1. Cardiomyopathy, unspecified type (HCC)   2. Nonocclusive coronary atherosclerosis of native coronary artery   3. Dyspnea on exertion   4. Primary hypertension   5. Type 2 diabetes mellitus with other specified complication, without long-term current use of insulin (HCC)   6. Pure hypercholesterolemia     PLAN:    Cardiomyopathy, unclear etiology -discussed ischemic vs. Nonsichemic -discussed options for further evaluation. After shared decision making, will pursue right and left heart cath.  -will take one baby aspirin the day of the cath. Long term upsets her stomach -on  losartan -starting low dose metoprolol -further med titration based on results of cath -check labs, ECG prior to cath  Shared Decision Making/Informed Consent The risks [stroke (1 in 1000), death (1 in 1000), kidney failure [usually temporary] (1 in 500), bleeding (1 in 200), allergic reaction [possibly serious] (1 in 200)], benefits (diagnostic support and management of coronary artery disease) and alternatives of a cardiac catheterization were discussed in detail with Ms. Winnick and she is willing to proceed.   Dyspnea on exertion Nonobstructive CAD Aortic atherosclerosis Hypercholesterolemia -reviewed CT coronary in 2020, calcium score 49, cadsrads 1, 1-24% stenosis in prox LAD and prox Lcx -tolerating pravastatin -aspirin long term upsets her stomach, but no bleeding.  Hypertension -at goal -continue valsartan-hctz  Type II diabetes -diet controlled currently  Cardiac risk counseling and prevention recommendations: -recommend heart healthy/Mediterranean diet, with whole grains, fruits, vegetable, fish, lean meats, nuts, and olive oil. Limit salt. -recommend moderate walking, 3-5 times/week for 30-50 minutes each session. Aim for at least 150 minutes.week. Goal should be pace of 3 miles/hours, or walking 1.5 miles in 30 minutes -recommend avoidance of tobacco products. Avoid excess alcohol. -ASCVD risk score: The 10-year ASCVD risk score (Arnett DK, et al., 2019) is: 54.8%   Values used to calculate the score:     Age: 39 years     Sex: Female     Is Non-Hispanic African American: No     Diabetic: Yes     Tobacco smoker: No     Systolic Blood Pressure: 132 mmHg     Is BP treated: Yes     HDL Cholesterol: 65 mg/dL     Total Cholesterol: 191 mg/dL    Plan for follow up: several weeks after cath  Jodelle Red, MD, PhD, [facility]  [facility]    Medication Adjustments/Labs and Tests Ordered: Current medicines are reviewed at length with the patient  today.  Concerns regarding medicines are outlined above.   Orders Placed This Encounter  Procedures   CBC   Comprehensive metabolic panel   Lipid panel   EKG 12-Lead  Meds ordered this encounter  Medications   metoprolol succinate (TOPROL XL) 25 MG 24 hr tablet    Sig: Take 1 tablet (25 mg total) by mouth daily.    Dispense:  90 tablet    Refill:  3   pravastatin (PRAVACHOL) 10 MG tablet    Sig: Take 1 tablet (10 mg total) by mouth daily.    Dispense:  90 tablet    Refill:  3   Patient Instructions  Medication Instructions:   START Metoprolol 25 mg daily.  *If you need a refill on your cardiac medications before your next appointment, please call your pharmacy*   Lab Work: Your provider has recommended lab work today: CMET, LIPID, CBC.   Testing/Procedures:   [facility] & VASCULAR AT [facility] [address] [place] [address] Dept: [phone]  DEOLA REWIS  05/17/2023  You are scheduled for a Cardiac Catheterization on Wednesday, June 12 with Dr. Bryan Lemma.  1. Please arrive at the [facility] (Main Entrance A) at [facility]: [address] [place], [place] [address] at 8:00 AM (This time is 2 hour(s) before your procedure to ensure your preparation). Free valet parking service is available. You will check in at ADMITTING. The support person will be asked to wait in the waiting room.  It is OK to have someone drop you off and come back when you are ready to be discharged.    Special note: Every effort is made to have your procedure done on time. Please understand that emergencies sometimes delay scheduled procedures.  2. Diet: Do not eat solid foods after midnight.  The patient may have clear liquids until 5am upon the day of the procedure.  3. Labs: You will need to have blood drawn on Friday, June 7 at [facility] at [facility]:  [address]  [place], [place] [address] (lab is in the Primary Care office located on the 3rd floor).  Hours: 8:00 am - 4:30 pm (avoid 12:00 pm - 1:00 pm)  Phone: [phone].  Tell staff you are there for blood work and they will direct you to the lab.  You do not need an appointment. You do not need to be fasting.  4. Medication instructions in preparation for your procedure:  DO NOT take Valsartan-HCTZ the morning of your procedure.  On the morning of your procedure, take your Aspirin 81 mg and any of your other morning medications NOT listed above.  You may use sips of water.  5. Plan to go home the same day, you will only stay overnight if medically necessary. 6. Bring a current list of your medications and current insurance cards. 7. You MUST have a responsible person to drive you home. 8. Someone MUST be with you the first 24 hours after you arrive home or your discharge will be delayed. 9. Please wear clothes that are easy to get on and off and wear slip-on shoes.  Thank you for allowing [place] to care for you!   -- [facility]    Follow-Up: At [facility], you and your health needs are our priority.  As part of our continuing mission to provide you with exceptional heart care, we have created designated Provider Care Teams.  These Care Teams include your primary Cardiologist (physician) and Advanced Practice Providers (APPs -  Physician Assistants and Nurse Practitioners) who all work together to provide you with the  care you need, when you need it.  We recommend signing up for the patient portal called "MyChart".  Sign up information is provided on this After Visit Summary.  MyChart is used to connect with patients for Virtual Visits (Telemedicine).  Patients are able to view lab/test results, encounter notes, upcoming appointments, etc.  Non-urgent messages can be sent to your provider as well.   To learn more about what you can do with MyChart, go to  [email or web address].    Your next appointment:   2 week(s) after CATH  Provider:   Jodelle Red, MD       Signed, Jodelle Red, MD PhD 05/17/2023 12:48 PM    [facility]

## 2023-05-17 NOTE — Patient Instructions (Signed)
Medication Instructions:   START Metoprolol 25 mg daily.  *If you need a refill on your cardiac medications before your next appointment, please call your pharmacy*   Lab Work: Your provider has recommended lab work today: CMET, LIPID, CBC.   Testing/Procedures:   [facility] & VASCULAR AT [facility] [address] [place] [address] Dept: [phone]  COUMBA FOSSEY  05/17/2023  You are scheduled for a Cardiac Catheterization on Wednesday, June 12 with Dr. Bryan Lemma.  1. Please arrive at the [facility] (Main Entrance A) at [facility]: [address] [place], [place] [address] at 8:00 AM (This time is 2 hour(s) before your procedure to ensure your preparation). Free valet parking service is available. You will check in at ADMITTING. The support person will be asked to wait in the waiting room.  It is OK to have someone drop you off and come back when you are ready to be discharged.    Special note: Every effort is made to have your procedure done on time. Please understand that emergencies sometimes delay scheduled procedures.  2. Diet: Do not eat solid foods after midnight.  The patient may have clear liquids until 5am upon the day of the procedure.  3. Labs: You will need to have blood drawn on Friday, June 7 at [facility] at [facility]:  [address] [place], [place] [address] (lab is in the Primary Care office located on the 3rd floor).  Hours: 8:00 am - 4:30 pm (avoid 12:00 pm - 1:00 pm)  Phone: [phone].  Tell staff you are there for blood work and they will direct you to the lab.  You do not need an appointment. You do not need to be fasting.  4. Medication instructions in preparation for your procedure:  DO NOT take Valsartan-HCTZ the morning of your procedure.  On the morning of your procedure, take your Aspirin 81 mg and any of your other morning  medications NOT listed above.  You may use sips of water.  5. Plan to go home the same day, you will only stay overnight if medically necessary. 6. Bring a current list of your medications and current insurance cards. 7. You MUST have a responsible person to drive you home. 8. Someone MUST be with you the first 24 hours after you arrive home or your discharge will be delayed. 9. Please wear clothes that are easy to get on and off and wear slip-on shoes.  Thank you for allowing [place] to care for you!   -- [place] [facility]    Follow-Up: At [facility], you and your health needs are our priority.  As part of our continuing mission to provide you with exceptional heart care, we have created designated Provider Care Teams.  These Care Teams include your primary Cardiologist (physician) and Advanced Practice Providers (APPs -  Physician Assistants and Nurse Practitioners) who all work together to provide you with the care you need, when you need it.  We recommend signing up for the patient portal called "MyChart".  Sign up information is provided on this After Visit Summary.  MyChart is used to connect with patients for Virtual Visits (Telemedicine).  Patients are able to view lab/test results, encounter notes, upcoming appointments, etc.  Non-urgent messages can be sent to your provider as well.   To learn more about what you can do with MyChart, go to [email or web address].  Your next appointment:   2 week(s) after CATH  Provider:   Jodelle Red, MD

## 2023-05-18 DIAGNOSIS — G4733 Obstructive sleep apnea (adult) (pediatric): Secondary | ICD-10-CM | POA: Diagnosis not present

## 2023-05-18 LAB — COMPREHENSIVE METABOLIC PANEL
ALT: 15 IU/L (ref 0–32)
AST: 20 IU/L (ref 0–40)
Albumin/Globulin Ratio: 1.8 (ref 1.2–2.2)
Albumin: 4.2 g/dL (ref 3.8–4.8)
Alkaline Phosphatase: 72 IU/L (ref 44–121)
BUN/Creatinine Ratio: 13 (ref 12–28)
BUN: 14 mg/dL (ref 8–27)
Bilirubin Total: 0.4 mg/dL (ref 0.0–1.2)
CO2: 23 mmol/L (ref 20–29)
Calcium: 9.6 mg/dL (ref 8.7–10.3)
Chloride: 101 mmol/L (ref 96–106)
Creatinine, Ser: 1.08 mg/dL — ABNORMAL HIGH (ref 0.57–1.00)
Globulin, Total: 2.3 g/dL (ref 1.5–4.5)
Glucose: 114 mg/dL — ABNORMAL HIGH (ref 70–99)
Potassium: 4.4 mmol/L (ref 3.5–5.2)
Sodium: 141 mmol/L (ref 134–144)
Total Protein: 6.5 g/dL (ref 6.0–8.5)
eGFR: 52 mL/min/{1.73_m2} — ABNORMAL LOW (ref >59)

## 2023-05-18 LAB — CBC
Hematocrit: 35 % (ref 34.0–46.6)
Hemoglobin: 12.2 g/dL (ref 11.1–15.9)
MCH: 33.2 pg — ABNORMAL HIGH (ref 26.6–33.0)
MCHC: 34.9 g/dL (ref 31.5–35.7)
MCV: 95 fL (ref 79–97)
Platelets: 348 10*3/uL (ref 150–450)
RBC: 3.68 x10E6/uL — ABNORMAL LOW (ref 3.77–5.28)
RDW: 12.4 % (ref 11.7–15.4)
WBC: 6.9 10*3/uL (ref 3.4–10.8)

## 2023-05-18 LAB — LIPID PANEL
Chol/HDL Ratio: 2.1 ratio (ref 0.0–4.4)
Cholesterol, Total: 155 mg/dL (ref 100–199)
HDL: 73 mg/dL (ref >39)
LDL Chol Calc (NIH): 67 mg/dL (ref 0–99)
Triglycerides: 76 mg/dL (ref 0–149)
VLDL Cholesterol Cal: 15 mg/dL (ref 5–40)

## 2023-05-21 ENCOUNTER — Telehealth: Payer: Self-pay | Admitting: *Deleted

## 2023-05-21 ENCOUNTER — Encounter: Payer: Self-pay | Admitting: *Deleted

## 2023-05-21 NOTE — Telephone Encounter (Signed)
Cardiac Catheterization scheduled at [facility] for: Wednesday May 22, 2023 10 AM Arrival time [facility] Main Entrance A at: 8 AM  Nothing to eat after midnight prior to procedure, clear liquids until 5 AM day of procedure.  Medication instructions: -Hold:  ValsartanHCT-day before and day of procedure-per protocol GFR 52-pt already taken today  -Other usual  morning medications can be taken with sips of water including aspirin 81 mg.  Patient feels she can take this dose morning of procedure Confirmed patient has responsible adult to drive home post procedure and be with patient first 24 hours after arriving home.  Plan to go home the same day, you will only stay overnight if medically necessary.  Reviewed procedure instructions with patient.

## 2023-05-22 ENCOUNTER — Other Ambulatory Visit: Payer: Self-pay

## 2023-05-22 ENCOUNTER — Ambulatory Visit (HOSPITAL_COMMUNITY)
Admission: RE | Admit: 2023-05-22 | Discharge: 2023-05-22 | Disposition: A | Discharge status: Home / Self Care | Payer: Medicare Other | Source: Ambulatory Visit | Attending: Cardiology | Admitting: Cardiology

## 2023-05-22 ENCOUNTER — Encounter (HOSPITAL_COMMUNITY)
Admission: RE | Disposition: A | Discharge status: Home / Self Care | Payer: Self-pay | Source: Ambulatory Visit | Attending: Cardiology

## 2023-05-22 DIAGNOSIS — G4733 Obstructive sleep apnea (adult) (pediatric): Secondary | ICD-10-CM | POA: Diagnosis not present

## 2023-05-22 DIAGNOSIS — I272 Pulmonary hypertension, unspecified: Secondary | ICD-10-CM | POA: Diagnosis not present

## 2023-05-22 DIAGNOSIS — E119 Type 2 diabetes mellitus without complications: Secondary | ICD-10-CM | POA: Diagnosis not present

## 2023-05-22 DIAGNOSIS — M810 Age-related osteoporosis without current pathological fracture: Secondary | ICD-10-CM | POA: Diagnosis not present

## 2023-05-22 DIAGNOSIS — R0602 Shortness of breath: Secondary | ICD-10-CM | POA: Diagnosis not present

## 2023-05-22 DIAGNOSIS — R0609 Other forms of dyspnea: Secondary | ICD-10-CM | POA: Diagnosis not present

## 2023-05-22 DIAGNOSIS — K219 Gastro-esophageal reflux disease without esophagitis: Secondary | ICD-10-CM | POA: Insufficient documentation

## 2023-05-22 DIAGNOSIS — R079 Chest pain, unspecified: Secondary | ICD-10-CM

## 2023-05-22 DIAGNOSIS — Z87891 Personal history of nicotine dependence: Secondary | ICD-10-CM | POA: Insufficient documentation

## 2023-05-22 DIAGNOSIS — I429 Cardiomyopathy, unspecified: Secondary | ICD-10-CM | POA: Insufficient documentation

## 2023-05-22 DIAGNOSIS — I10 Essential (primary) hypertension: Secondary | ICD-10-CM | POA: Insufficient documentation

## 2023-05-22 HISTORY — PX: RIGHT/LEFT HEART CATH AND CORONARY ANGIOGRAPHY: CATH118266

## 2023-05-22 LAB — POCT I-STAT EG7
Acid-Base Excess: 2 mmol/L (ref 0.0–2.0)
Acid-Base Excess: 2 mmol/L (ref 0.0–2.0)
Bicarbonate: 26.5 mmol/L (ref 20.0–28.0)
Bicarbonate: 26.7 mmol/L (ref 20.0–28.0)
Calcium, Ion: 1.1 mmol/L — ABNORMAL LOW (ref 1.15–1.40)
Calcium, Ion: 1.13 mmol/L — ABNORMAL LOW (ref 1.15–1.40)
HCT: 36 % (ref 36.0–46.0)
HCT: 37 % (ref 36.0–46.0)
Hemoglobin: 12.2 g/dL (ref 12.0–15.0)
Hemoglobin: 12.6 g/dL (ref 12.0–15.0)
O2 Saturation: 78 %
O2 Saturation: 78 %
Potassium: 3.7 mmol/L (ref 3.5–5.1)
Potassium: 3.7 mmol/L (ref 3.5–5.1)
Sodium: 141 mmol/L (ref 135–145)
Sodium: 141 mmol/L (ref 135–145)
TCO2: 28 mmol/L (ref 22–32)
TCO2: 28 mmol/L (ref 22–32)
pCO2, Ven: 41.9 mmHg — ABNORMAL LOW (ref 44–60)
pCO2, Ven: 42.1 mmHg — ABNORMAL LOW (ref 44–60)
pH, Ven: 7.409 (ref 7.25–7.43)
pH, Ven: 7.41 (ref 7.25–7.43)
pO2, Ven: 42 mmHg (ref 32–45)
pO2, Ven: 42 mmHg (ref 32–45)

## 2023-05-22 LAB — POCT I-STAT 7, (LYTES, BLD GAS, ICA,H+H)
Acid-Base Excess: 1 mmol/L (ref 0.0–2.0)
Bicarbonate: 25.8 mmol/L (ref 20.0–28.0)
Calcium, Ion: 1.15 mmol/L (ref 1.15–1.40)
HCT: 37 % (ref 36.0–46.0)
Hemoglobin: 12.6 g/dL (ref 12.0–15.0)
O2 Saturation: 97 %
Potassium: 3.7 mmol/L (ref 3.5–5.1)
Sodium: 140 mmol/L (ref 135–145)
TCO2: 27 mmol/L (ref 22–32)
pCO2 arterial: 38.9 mmHg (ref 32–48)
pH, Arterial: 7.43 (ref 7.35–7.45)
pO2, Arterial: 86 mmHg (ref 83–108)

## 2023-05-22 SURGERY — RIGHT/LEFT HEART CATH AND CORONARY ANGIOGRAPHY
Anesthesia: LOCAL

## 2023-05-22 MED ORDER — SODIUM CHLORIDE 0.9 % IV SOLN: INTRAVENOUS | Status: DC

## 2023-05-22 MED ORDER — VERAPAMIL HCL 2.5 MG/ML IV SOLN
INTRAVENOUS | Status: DC | PRN
  Administered 2023-05-22: 10 mL via INTRA_ARTERIAL

## 2023-05-22 MED ORDER — SODIUM CHLORIDE 0.9% FLUSH: 3.0000 mL | INTRAVENOUS | Status: DC | PRN

## 2023-05-22 MED ORDER — IOHEXOL 350 MG/ML SOLN
INTRAVENOUS | Status: DC | PRN
  Administered 2023-05-22: 60 mL

## 2023-05-22 MED ORDER — SODIUM CHLORIDE 0.9% FLUSH: 3.0000 mL | Freq: Two times a day (BID) | INTRAVENOUS | Status: DC

## 2023-05-22 MED ORDER — MIDAZOLAM HCL 2 MG/2ML IJ SOLN
INTRAMUSCULAR | Status: DC | PRN
  Administered 2023-05-22: 1 mg via INTRAVENOUS

## 2023-05-22 MED ORDER — SODIUM CHLORIDE 0.9 % WEIGHT BASED INFUSION
3.0000 mL/kg/h | INTRAVENOUS | Status: AC
  Administered 2023-05-22: 3 mL/kg/h via INTRAVENOUS

## 2023-05-22 MED ORDER — HEPARIN (PORCINE) IN NACL 1000-0.9 UT/500ML-% IV SOLN
INTRAVENOUS | Status: DC | PRN
  Administered 2023-05-22 (×2): 500 mL

## 2023-05-22 MED ORDER — SODIUM CHLORIDE 0.9 % WEIGHT BASED INFUSION: 1.0000 mL/kg/h | INTRAVENOUS | Status: DC

## 2023-05-22 MED ORDER — MIDAZOLAM HCL 2 MG/2ML IJ SOLN
INTRAMUSCULAR | Status: AC
  Filled 2023-05-22: qty 2

## 2023-05-22 MED ORDER — VERAPAMIL HCL 2.5 MG/ML IV SOLN
INTRAVENOUS | Status: AC
  Filled 2023-05-22: qty 2

## 2023-05-22 MED ORDER — SODIUM CHLORIDE 0.9 % IV SOLN: 250.0000 mL | INTRAVENOUS | Status: DC | PRN

## 2023-05-22 MED ORDER — ASPIRIN 81 MG PO CHEW: 81.0000 mg | CHEWABLE_TABLET | ORAL | Status: DC

## 2023-05-22 MED ORDER — HYDRALAZINE HCL 20 MG/ML IJ SOLN: 10.0000 mg | INTRAMUSCULAR | Status: DC | PRN

## 2023-05-22 MED ORDER — LIDOCAINE HCL (PF) 1 % IJ SOLN
INTRAMUSCULAR | Status: AC
  Filled 2023-05-22: qty 30

## 2023-05-22 MED ORDER — FENTANYL CITRATE (PF) 100 MCG/2ML IJ SOLN
INTRAMUSCULAR | Status: DC | PRN
  Administered 2023-05-22: 25 ug via INTRAVENOUS

## 2023-05-22 MED ORDER — HEPARIN SODIUM (PORCINE) 1000 UNIT/ML IJ SOLN
INTRAMUSCULAR | Status: AC
  Filled 2023-05-22: qty 10

## 2023-05-22 MED ORDER — LIDOCAINE HCL (PF) 1 % IJ SOLN
INTRAMUSCULAR | Status: DC | PRN
  Administered 2023-05-22 (×2): 2 mL via INTRADERMAL

## 2023-05-22 MED ORDER — FENTANYL CITRATE (PF) 100 MCG/2ML IJ SOLN
INTRAMUSCULAR | Status: AC
  Filled 2023-05-22: qty 2

## 2023-05-22 MED ORDER — ONDANSETRON HCL 4 MG/2ML IJ SOLN: 4.0000 mg | Freq: Four times a day (QID) | INTRAMUSCULAR | Status: DC | PRN

## 2023-05-22 MED ORDER — LABETALOL HCL 5 MG/ML IV SOLN: 10.0000 mg | INTRAVENOUS | Status: DC | PRN

## 2023-05-22 MED ORDER — HEPARIN SODIUM (PORCINE) 1000 UNIT/ML IJ SOLN
INTRAMUSCULAR | Status: DC | PRN
  Administered 2023-05-22: 4500 [IU] via INTRAVENOUS

## 2023-05-22 MED ORDER — ACETAMINOPHEN 325 MG PO TABS: 650.0000 mg | ORAL_TABLET | ORAL | Status: DC | PRN

## 2023-05-22 SURGICAL SUPPLY — 13 items
CATH BALLN WEDGE 5F 110CM (CATHETERS) IMPLANT
CATH INFINITI 5FR ANG PIGTAIL (CATHETERS) IMPLANT
CATH OPTITORQUE TIG 4.0 5F (CATHETERS) IMPLANT
DEVICE RAD TR BAND REGULAR (VASCULAR PRODUCTS) IMPLANT
GLIDESHEATH SLEND SS 6F .021 (SHEATH) IMPLANT
GUIDEWIRE INQWIRE 1.5J.035X260 (WIRE) IMPLANT
INQWIRE 1.5J .035X260CM (WIRE) ×1
KIT HEART LEFT (KITS) ×1 IMPLANT
PACK CARDIAC CATHETERIZATION (CUSTOM PROCEDURE TRAY) ×1 IMPLANT
SHEATH GLIDE SLENDER 4/5FR (SHEATH) IMPLANT
SHEATH PROBE COVER 6X72 (BAG) IMPLANT
TRANSDUCER W/STOPCOCK (MISCELLANEOUS) ×1 IMPLANT
TUBING CIL FLEX 10 FLL-RA (TUBING) ×1 IMPLANT

## 2023-05-22 NOTE — Interval H&P Note (Signed)
History and Physical Interval Note:  05/22/2023 11:04 AM  Robin Lindsey  has presented today for surgery, with the diagnosis of cardiomyopathy.  The various methods of treatment have been discussed with the patient and family. After consideration of risks, benefits and other options for treatment, the patient has consented to  Procedure(s): RIGHT/LEFT HEART CATH AND CORONARY ANGIOGRAPHY (N/A)  PERCUTANEOUS CORONARY INTERVENTION   as a surgical intervention.  The patient's history has been reviewed, patient examined, no change in status, stable for surgery.  I have reviewed the patient's chart and labs.  Questions were answered to the patient's satisfaction.    Cath Lab Visit (complete for each Cath Lab visit)  Clinical Evaluation Leading to the Procedure:   ACS: No.  Non-ACS:    Anginal Classification: CCS II  Anti-ischemic medical therapy: Minimal Therapy (1 class of medications)  Non-Invasive Test Results: Equivocal test results  Prior CABG: No previous CABG    Bryan Lemma

## 2023-05-23 ENCOUNTER — Encounter (HOSPITAL_COMMUNITY): Payer: Self-pay | Admitting: Cardiology

## 2023-06-10 ENCOUNTER — Telehealth (HOSPITAL_BASED_OUTPATIENT_CLINIC_OR_DEPARTMENT_OTHER): Payer: Self-pay

## 2023-06-10 ENCOUNTER — Ambulatory Visit (HOSPITAL_BASED_OUTPATIENT_CLINIC_OR_DEPARTMENT_OTHER): Payer: Medicare Other | Admitting: Cardiology

## 2023-06-10 ENCOUNTER — Encounter (HOSPITAL_BASED_OUTPATIENT_CLINIC_OR_DEPARTMENT_OTHER): Payer: Self-pay | Admitting: Cardiology

## 2023-06-10 VITALS — BP 138/83 | HR 76 | Ht 59.0 in | Wt 211.0 lb

## 2023-06-10 DIAGNOSIS — I10 Essential (primary) hypertension: Secondary | ICD-10-CM | POA: Diagnosis not present

## 2023-06-10 DIAGNOSIS — E7800 Pure hypercholesterolemia, unspecified: Secondary | ICD-10-CM | POA: Diagnosis not present

## 2023-06-10 DIAGNOSIS — I2510 Atherosclerotic heart disease of native coronary artery without angina pectoris: Secondary | ICD-10-CM | POA: Diagnosis not present

## 2023-06-10 DIAGNOSIS — I420 Dilated cardiomyopathy: Secondary | ICD-10-CM | POA: Diagnosis not present

## 2023-06-10 DIAGNOSIS — R0609 Other forms of dyspnea: Secondary | ICD-10-CM | POA: Diagnosis not present

## 2023-06-10 MED ORDER — EMPAGLIFLOZIN 10 MG PO TABS: 10.0000 mg | ORAL_TABLET | Freq: Every day | ORAL | 0 refills | Status: DC

## 2023-06-10 MED ORDER — EMPAGLIFLOZIN 10 MG PO TABS
10.0000 mg | ORAL_TABLET | Freq: Every day | ORAL | 11 refills | Status: DC
Start: 2023-06-10 — End: 2023-09-26

## 2023-06-10 MED ORDER — METOPROLOL SUCCINATE ER 25 MG PO TB24
12.5000 mg | ORAL_TABLET | Freq: Every day | ORAL | 3 refills | Status: DC
Start: 2023-06-10 — End: 2023-09-24

## 2023-06-10 NOTE — Telephone Encounter (Signed)
Paitent started on jardiance 10mg  today, will likely need prior auth

## 2023-06-10 NOTE — Patient Instructions (Signed)
Medication Instructions:  DECREASED- Metoprolol Succinate 12.5 mg by mouth daily START- Jardiance 10 mg by mouth daily  *If you need a refill on your cardiac medications before your next appointment, please call your pharmacy*   Lab Work: BMP in 3 weeks  If you have labs (blood work) drawn today and your tests are completely normal, you will receive your results only by: MyChart Message (if you have MyChart) OR A paper copy in the mail If you have any lab test that is abnormal or we need to change your treatment, we will call you to review the results.   Testing/Procedures: None Ordered   Follow-Up: At [facility], you and your health needs are our priority.  As part of our continuing mission to provide you with exceptional heart care, we have created designated Provider Care Teams.  These Care Teams include your primary Cardiologist (physician) and Advanced Practice Providers (APPs -  Physician Assistants and Nurse Practitioners) who all work together to provide you with the care you need, when you need it.  We recommend signing up for the patient portal called "MyChart".  Sign up information is provided on this After Visit Summary.  MyChart is used to connect with patients for Virtual Visits (Telemedicine).  Patients are able to view lab/test results, encounter notes, upcoming appointments, etc.  Non-urgent messages can be sent to your provider as well.   To learn more about what you can do with MyChart, go to [email or web address].    Your next appointment:   3 month(s)  Provider:   Jodelle Red, MD    Other Instructions

## 2023-06-10 NOTE — Progress Notes (Signed)
Cardiology Office Note:  .    Date:  06/10/2023  ID:  Robin Lindsey, DOB 06/17/1946, MRN [id number] PCP: Junie Spencer, FNP  [facility] Providers Cardiologist:  Jodelle Red, MD     History of Present Illness: .    Robin Lindsey is a 77 y.o. female with a hx of nonischemic cardiomyopathy diagnosed 2024, hypertension, diabetes, who is seen for follow up today. I initially met her 02/18/23 as a new consult at the request of Junie Spencer, FNP for the evaluation and management of dyspnea on exertion.   Cardiovascular History: Echo on 03/14/23 showed 3D EF 42% without regional wall abnormalities. ETT with 4.8 METs without ischemic changes. Cath 05/22/2023 revealed no aortic valve stenosis and angiographically normal coronary arteries. There was borderline pulmonary hypertension with mean PA and PCWP of 24 mmHg; LVEDP only 16 mmHg.   Today, she is accompanied by her husband. She continues to have shortness of breath that is mostly stable, but may have improved somewhat. Lately she has been able to walk a little further without having to stop.  Her LE edema is an ongoing issue, but relatively unchanged since her last visit. Swelling is frequently persistent although less severe in the mornings after waking up.  At home her blood pressures have been stable when she does check. She denies any very high or low readings. In the office her blood pressure is 138/83. For about 2 weeks she had tried taking the metoprolol. She then stopped taking it as it made her feel very fatigued and worn out. Discussed possibly trying 1/2 tablet (12.5 mg) daily if she would like.  She denies any palpitations, chest pain, lightheadedness, headaches, syncope, orthopnea, or PND.  ROS:  Please see the history of present illness. ROS otherwise negative except as noted.  (+) Shortness of breath (+) BLE edema  Studies Reviewed: Marland Kitchen       ECG not ordered today  Physical Exam:    VS:  BP 138/83    Pulse 76   Ht 4\' 11"  (1.499 m)   Wt 211 lb (95.7 kg)   SpO2 97%   BMI 42.62 kg/m    Wt Readings from Last 3 Encounters:  06/10/23 211 lb (95.7 kg)  05/22/23 208 lb (94.3 kg)  05/17/23 210 lb (95.3 kg)    GEN: Well nourished, well developed in no acute distress HEENT: Normal, moist mucous membranes NECK: No JVD CARDIAC: regular rhythm, normal S1 and S2, no rubs or gallops. No murmur. VASCULAR: Radial and DP pulses 2+ bilaterally. No carotid bruits RESPIRATORY:  Clear to auscultation without rales, wheezing or rhonchi  ABDOMEN: Soft, non-tender, non-distended MUSCULOSKELETAL:  Ambulates independently SKIN: Warm and dry, bilateral 1+ LE edema to ankles NEUROLOGIC:  Alert and oriented x 3. No focal neuro deficits noted. PSYCHIATRIC:  Normal affect   ASSESSMENT AND PLAN: .    Cardiomyopathy, nonsichemic -on losartan -did not tolerate 25 mg of metoprolol; willing to trial 12.5 mg dose -discussed SGLT2i, she is amenable. Jardiance appears preferred on her formulary, ordered today. Check BMET in 2-3 weeks -next med would be spironolactone if K allows   Dyspnea on exertion Nonobstructive CAD Aortic atherosclerosis Hypercholesterolemia -minimal CAD on CT, angiographically normal on cath -tolerating pravastatin -aspirin long term upsets her stomach, but no bleeding.   Hypertension -continue valsartan-hctz   Type II diabetes -diet controlled currently -adding SGLT2i as above   Cardiac risk counseling and prevention recommendations: -recommend heart healthy/Mediterranean diet, with whole grains,  fruits, vegetable, fish, lean meats, nuts, and olive oil. Limit salt. -recommend moderate walking, 3-5 times/week for 30-50 minutes each session. Aim for at least 150 minutes.week. Goal should be pace of 3 miles/hours, or walking 1.5 miles in 30 minutes -recommend avoidance of tobacco products. Avoid excess alcohol.  Dispo: Follow-up in 3 months, or sooner as needed.  I,Mathew  Stumpf,acting as a Neurosurgeon for [employer], MD.,have documented all relevant documentation on the behalf of Jodelle Red, MD,as directed by  Jodelle Red, MD while in the presence of Jodelle Red, MD.  I, Jodelle Red, MD, have reviewed all documentation for this visit. The documentation on 06/10/23 for the exam, diagnosis, procedures, and orders are all accurate and complete.   Signed, Jodelle Red, MD

## 2023-06-12 ENCOUNTER — Other Ambulatory Visit (HOSPITAL_COMMUNITY): Payer: Self-pay

## 2023-06-12 NOTE — Telephone Encounter (Signed)
Per test claim no PA required.  

## 2023-06-17 DIAGNOSIS — G4733 Obstructive sleep apnea (adult) (pediatric): Secondary | ICD-10-CM | POA: Diagnosis not present

## 2023-06-18 ENCOUNTER — Encounter (HOSPITAL_BASED_OUTPATIENT_CLINIC_OR_DEPARTMENT_OTHER): Payer: Medicare Other

## 2023-06-18 ENCOUNTER — Ambulatory Visit (INDEPENDENT_AMBULATORY_CARE_PROVIDER_SITE_OTHER): Payer: Medicare Other | Admitting: Pulmonary Disease

## 2023-06-18 DIAGNOSIS — R0609 Other forms of dyspnea: Secondary | ICD-10-CM

## 2023-06-18 LAB — PULMONARY FUNCTION TEST
DL/VA % pred: 122 %
DL/VA: 5.21 ml/min/mmHg/L
DLCO cor % pred: 109 %
DLCO cor: 17.26 ml/min/mmHg
DLCO unc % pred: 104 %
DLCO unc: 16.59 ml/min/mmHg
FEF 25-75 Post: 1.79 L/sec
FEF 25-75 Pre: 1.59 L/sec
FEF2575-%Change-Post: 12 %
FEF2575-%Pred-Post: 151 %
FEF2575-%Pred-Pre: 134 %
FEV1-%Change-Post: 3 %
FEV1-%Pred-Post: 86 %
FEV1-%Pred-Pre: 83 %
FEV1-Post: 1.31 L
FEV1-Pre: 1.26 L
FEV1FVC-%Change-Post: 1 %
FEV1FVC-%Pred-Pre: 114 %
FEV6-%Change-Post: 2 %
FEV6-%Pred-Post: 78 %
FEV6-%Pred-Pre: 76 %
FEV6-Post: 1.52 L
FEV6-Pre: 1.48 L
FEV6FVC-%Pred-Post: 106 %
FEV6FVC-%Pred-Pre: 106 %
FVC-%Change-Post: 2 %
FVC-%Pred-Post: 73 %
FVC-%Pred-Pre: 72 %
FVC-Post: 1.52 L
FVC-Pre: 1.49 L
Post FEV1/FVC ratio: 86 %
Post FEV6/FVC ratio: 100 %
Pre FEV1/FVC ratio: 85 %
Pre FEV6/FVC Ratio: 100 %

## 2023-06-18 NOTE — Progress Notes (Signed)
Pre and post spirometry and DLCO completed today. Pleth not completed today r/t pt being claustrophobic.

## 2023-06-26 DIAGNOSIS — H401123 Primary open-angle glaucoma, left eye, severe stage: Secondary | ICD-10-CM | POA: Diagnosis not present

## 2023-06-26 DIAGNOSIS — Z961 Presence of intraocular lens: Secondary | ICD-10-CM | POA: Diagnosis not present

## 2023-06-26 DIAGNOSIS — H401111 Primary open-angle glaucoma, right eye, mild stage: Secondary | ICD-10-CM | POA: Diagnosis not present

## 2023-06-28 ENCOUNTER — Ambulatory Visit: Payer: Medicare Other | Admitting: Family

## 2023-07-01 DIAGNOSIS — I420 Dilated cardiomyopathy: Secondary | ICD-10-CM | POA: Diagnosis not present

## 2023-07-01 DIAGNOSIS — I10 Essential (primary) hypertension: Secondary | ICD-10-CM | POA: Diagnosis not present

## 2023-07-01 DIAGNOSIS — I2510 Atherosclerotic heart disease of native coronary artery without angina pectoris: Secondary | ICD-10-CM | POA: Diagnosis not present

## 2023-07-02 LAB — BASIC METABOLIC PANEL
BUN/Creatinine Ratio: 13 (ref 12–28)
BUN: 13 mg/dL (ref 8–27)
CO2: 25 mmol/L (ref 20–29)
Calcium: 9 mg/dL (ref 8.7–10.3)
Chloride: 104 mmol/L (ref 96–106)
Creatinine, Ser: 1.04 mg/dL — ABNORMAL HIGH (ref 0.57–1.00)
Glucose: 114 mg/dL — ABNORMAL HIGH (ref 70–99)
Potassium: 5.2 mmol/L (ref 3.5–5.2)
Sodium: 143 mmol/L (ref 134–144)
eGFR: 55 mL/min/{1.73_m2} — ABNORMAL LOW (ref >59)

## 2023-07-08 DIAGNOSIS — G4733 Obstructive sleep apnea (adult) (pediatric): Secondary | ICD-10-CM | POA: Diagnosis not present

## 2023-07-18 ENCOUNTER — Ambulatory Visit: Payer: Medicare Other | Admitting: Family

## 2023-07-18 DIAGNOSIS — G4733 Obstructive sleep apnea (adult) (pediatric): Secondary | ICD-10-CM | POA: Diagnosis not present

## 2023-07-30 ENCOUNTER — Encounter: Payer: Self-pay | Admitting: Family

## 2023-07-30 ENCOUNTER — Ambulatory Visit (INDEPENDENT_AMBULATORY_CARE_PROVIDER_SITE_OTHER): Payer: Medicare Other | Admitting: Family

## 2023-07-30 VITALS — BP 130/79 | HR 67 | Temp 97.9°F | Ht 59.0 in | Wt 210.0 lb

## 2023-07-30 DIAGNOSIS — G4733 Obstructive sleep apnea (adult) (pediatric): Secondary | ICD-10-CM

## 2023-07-30 DIAGNOSIS — T466X5A Adverse effect of antihyperlipidemic and antiarteriosclerotic drugs, initial encounter: Secondary | ICD-10-CM

## 2023-07-30 DIAGNOSIS — M810 Age-related osteoporosis without current pathological fracture: Secondary | ICD-10-CM

## 2023-07-30 DIAGNOSIS — Z0000 Encounter for general adult medical examination without abnormal findings: Secondary | ICD-10-CM | POA: Diagnosis not present

## 2023-07-30 DIAGNOSIS — E559 Vitamin D deficiency, unspecified: Secondary | ICD-10-CM

## 2023-07-30 DIAGNOSIS — K219 Gastro-esophageal reflux disease without esophagitis: Secondary | ICD-10-CM | POA: Diagnosis not present

## 2023-07-30 DIAGNOSIS — M791 Myalgia, unspecified site: Secondary | ICD-10-CM

## 2023-07-30 DIAGNOSIS — E1159 Type 2 diabetes mellitus with other circulatory complications: Secondary | ICD-10-CM | POA: Diagnosis not present

## 2023-07-30 DIAGNOSIS — R0609 Other forms of dyspnea: Secondary | ICD-10-CM

## 2023-07-30 DIAGNOSIS — E1169 Type 2 diabetes mellitus with other specified complication: Secondary | ICD-10-CM | POA: Diagnosis not present

## 2023-07-30 DIAGNOSIS — Z0001 Encounter for general adult medical examination with abnormal findings: Secondary | ICD-10-CM

## 2023-07-30 DIAGNOSIS — F411 Generalized anxiety disorder: Secondary | ICD-10-CM

## 2023-07-30 DIAGNOSIS — I152 Hypertension secondary to endocrine disorders: Secondary | ICD-10-CM | POA: Diagnosis not present

## 2023-07-30 LAB — BAYER DCA HB A1C WAIVED: HB A1C (BAYER DCA - WAIVED): 6.6 % — ABNORMAL HIGH (ref 4.8–5.6)

## 2023-07-30 MED ORDER — VALSARTAN-HYDROCHLOROTHIAZIDE 160-25 MG PO TABS
ORAL_TABLET | ORAL | 3 refills | Status: DC
Start: 2023-07-30 — End: 2024-02-03

## 2023-07-30 MED ORDER — OMEPRAZOLE 20 MG PO CPDR
DELAYED_RELEASE_CAPSULE | ORAL | 3 refills | Status: DC
Start: 2023-07-30 — End: 2024-02-03

## 2023-07-30 NOTE — Progress Notes (Signed)
Subjective:    Patient ID: Robin Lindsey, female    DOB: 03/02/1946, 77 y.o.   MRN: [id number]  Chief Complaint  Patient presents with   Medical Management of Chronic Issues    Low energy worn out shaky in the afternoon. Numbness in feet and hands shots where in both hands it did get better. SOB with exertion seen cardiology and pulmonologist. Disscus wt loss med      Pt presents to the office today for CPE and chronic follow up.    She is followed by Pulmonologist's and diagnosed with OSA. Using CPAP and doing well. Continues to have intermittent SOB. Was encouraged to lose weight.    She saw Cardiologists and had stress test completed that showed 42%.    She is morbid obese with a 42.    PT complaining of burning, tingling in bilateral feet that is constant. Reports 7-8 out 10. Hypertension This is a chronic problem. The current episode started more than 1 year ago. The problem has been resolved since onset. The problem is controlled. Associated symptoms include anxiety, malaise/fatigue, peripheral edema and shortness of breath. Pertinent negatives include no blurred vision. Risk factors for coronary artery disease include dyslipidemia, obesity and sedentary lifestyle. The current treatment provides moderate improvement.  Gastroesophageal Reflux She complains of belching and heartburn. This is a chronic problem. The current episode started more than 1 year ago. The problem occurs occasionally. Risk factors include obesity. She has tried a PPI for the symptoms. The treatment provided moderate relief.  Diabetes She presents for her follow-up diabetic visit. She has type 2 diabetes mellitus. Hypoglycemia symptoms include nervousness/anxiousness. Associated symptoms include foot paresthesias. Pertinent negatives for diabetes include no blurred vision. Symptoms are stable. Diabetic complications include peripheral neuropathy. Risk factors for coronary artery disease include dyslipidemia,  diabetes mellitus, hypertension, sedentary lifestyle and post-menopausal. She is following a generally healthy diet. Her overall blood glucose range is 110-130 mg/dl.  Anxiety Presents for follow-up visit. Symptoms include excessive worry, nervous/anxious behavior, restlessness and shortness of breath. Symptoms occur occasionally. The severity of symptoms is moderate.        Review of Systems  Constitutional:  Positive for malaise/fatigue.  Eyes:  Negative for blurred vision.  Respiratory:  Positive for shortness of breath.   Gastrointestinal:  Positive for heartburn.  Psychiatric/Behavioral:  The patient is nervous/anxious.   All other systems reviewed and are negative.      Family History  Problem Relation Age of Onset   Colon polyps Mother    Diabetes Mother    Pulmonary fibrosis Mother    Colon cancer Maternal Aunt    Pulmonary fibrosis Maternal Aunt    Heart disease Father    Stroke Father    Dementia Father    Lung disease Father        realted to asbestos exposure   Thyroid disease Brother    Diabetes Brother    Hypertension Brother    Meniere's disease Brother    Pulmonary fibrosis Maternal Aunt    Anesthesia problems Neg Hx    Hypotension Neg Hx    Malignant hyperthermia Neg Hx    Pseudochol deficiency Neg Hx    Social History   Socioeconomic History   Marital status: Married    Spouse name: Louis   Number of children: 3   Years of education: 12   Highest education level: 12th grade  Occupational History   Occupation: retired  Tobacco Use   Smoking status: Former  Current packs/day: 0.00    Average packs/day: 1 pack/day for 5.0 years (5.0 ttl pk-yrs)    Types: Cigarettes    Start date: 03/10/1962    Quit date: 03/11/1967    Years since quitting: 56.4   Smokeless tobacco: Never   Tobacco comments:    smoked for 4-5 years from age 83- 86 years old  Vaping Use   Vaping status: Never Used  Substance and Sexual Activity   Alcohol use: No     Alcohol/week: 0.0 standard drinks of alcohol   Drug use: No   Sexual activity: Not Currently  Other Topics Concern   Not on file  Social History Narrative   Not on file   Social Determinants of Health   Financial Resource Strain: Low Risk  (11/26/2022)   Overall Financial Resource Strain (CARDIA)    Difficulty of Paying Living Expenses: Not hard at all  Food Insecurity: No Food Insecurity (11/26/2022)   Hunger Vital Sign    Worried About Running Out of Food in the Last Year: Never true    Ran Out of Food in the Last Year: Never true  Transportation Needs: No Transportation Needs (11/26/2022)   PRAPARE - Administrator, Civil Service (Medical): No    Lack of Transportation (Non-Medical): No  Physical Activity: Insufficiently Active (11/26/2022)   Exercise Vital Sign    Days of Exercise per Week: 3 days    Minutes of Exercise per Session: 30 min  Stress: No Stress Concern Present (11/26/2022)   [employer] of Occupational Health - Occupational Stress Questionnaire    Feeling of Stress : Not at all  Social Connections: Moderately Integrated (11/26/2022)   Social Connection and Isolation Panel [NHANES]    Frequency of Communication with Friends and Family: More than three times a week    Frequency of Social Gatherings with Friends and Family: Three times a week    Attends Religious Services: More than 4 times per year    Active Member of Clubs or Organizations: No    Attends Banker Meetings: Never    Marital Status: Married    Objective:   Physical Exam Vitals reviewed.  Constitutional:      General: She is not in acute distress.    Appearance: She is well-developed. She is obese.  HENT:     Head: Normocephalic and atraumatic.     Right Ear: Tympanic membrane normal.     Left Ear: Tympanic membrane normal.  Eyes:     Pupils: Pupils are equal, round, and reactive to light.  Neck:     Thyroid: No thyromegaly.  Cardiovascular:     Rate and  Rhythm: Normal rate and regular rhythm.     Heart sounds: Normal heart sounds. No murmur heard. Pulmonary:     Effort: Pulmonary effort is normal. No respiratory distress.     Breath sounds: Normal breath sounds. No wheezing.  Abdominal:     General: Bowel sounds are normal. There is no distension.     Palpations: Abdomen is soft.     Tenderness: There is no abdominal tenderness.  Musculoskeletal:        General: No tenderness. Normal range of motion.     Cervical back: Normal range of motion and neck supple.  Skin:    General: Skin is warm and dry.  Neurological:     Mental Status: She is alert and oriented to person, place, and time.     Cranial Nerves: No cranial nerve  deficit.     Deep Tendon Reflexes: Reflexes are normal and symmetric.  Psychiatric:        Behavior: Behavior normal.        Thought Content: Thought content normal.        Judgment: Judgment normal.       BP 130/79   Pulse 67   Temp 97.9 F (36.6 C) (Temporal)   Ht 4\' 11"  (1.499 m)   Wt 210 lb (95.3 kg)   SpO2 96%   BMI 42.41 kg/m      Assessment & Plan:  Robin Lindsey comes in today with chief complaint of Medical Management of Chronic Issues (Low energy worn out shaky in the afternoon. Numbness in feet and hands shots where in both hands it did get better. SOB with exertion seen cardiology and pulmonologist. Disscus wt loss med   )   Diagnosis and orders addressed:  1. Gastroesophageal reflux disease without esophagitis - omeprazole (PRILOSEC) 20 MG capsule; TAKE ONE (1) CAPSULE BY MOUTH 2 TIMES DAILY  Dispense: 180 capsule; Refill: 3 - CBC with Differential/Platelet - CMP14+EGFR  2. Hypertension associated with diabetes (HCC) - valsartan-hydrochlorothiazide (DIOVAN-HCT) 160-25 MG tablet; TAKE ONE (1) TABLET EACH DAY  Dispense: 90 tablet; Refill: 3 - CBC with Differential/Platelet - CMP14+EGFR  3. Type 2 diabetes mellitus with other specified complication, without long-term current use of  insulin (HCC) - CBC with Differential/Platelet - CMP14+EGFR - Lipid panel - Microalbumin / creatinine urine ratio - Bayer DCA Hb A1c Waived  4. DOE (dyspnea on exertion) - CBC with Differential/Platelet - CMP14+EGFR  5. GAD (generalized anxiety disorder) - CBC with Differential/Platelet - CMP14+EGFR  6. Morbid obesity (HCC) - CBC with Differential/Platelet - CMP14+EGFR  7. Myalgia due to statin - CBC with Differential/Platelet - CMP14+EGFR - Microalbumin / creatinine urine ratio  8. OSA (obstructive sleep apnea) - CBC with Differential/Platelet - CMP14+EGFR  9. Vitamin D deficiency - CBC with Differential/Platelet - CMP14+EGFR  10. Osteoporosis without current pathological fracture, unspecified osteoporosis type - CBC with Differential/Platelet - CMP14+EGFR - DG [facility] DEXA; Future  11. Annual physical exam - CBC with Differential/Platelet - CMP14+EGFR - Lipid panel - Microalbumin / creatinine urine ratio - Bayer DCA Hb A1c Waived - DG [facility] DEXA; Future - TSH   Labs pending Continue current medications  Discussed starting Ozempic today, but wants to think about it. Discussed benefits on helping to lose weight.  Health Maintenance reviewed Diet and exercise encouraged  Follow up plan: 6 months   [name], FNP

## 2023-07-30 NOTE — Patient Instructions (Signed)
Health Maintenance After Age 79 After age 79, you are at a higher risk for certain long-term diseases and infections as well as injuries from falls. Falls are a major cause of broken bones and head injuries in people who are older than age 79. Getting regular preventive care can help to keep you healthy and well. Preventive care includes getting regular testing and making lifestyle changes as recommended by your health care provider. Talk with your health care provider about: Which screenings and tests you should have. A screening is a test that checks for a disease when you have no symptoms. A diet and exercise plan that is right for you. What should I know about screenings and tests to prevent falls? Screening and testing are the best ways to find a health problem early. Early diagnosis and treatment give you the best chance of managing medical conditions that are common after age 79. Certain conditions and lifestyle choices may make you more likely to have a fall. Your health care provider may recommend: Regular vision checks. Poor vision and conditions such as cataracts can make you more likely to have a fall. If you wear glasses, make sure to get your prescription updated if your vision changes. Medicine review. Work with your health care provider to regularly review all of the medicines you are taking, including over-the-counter medicines. Ask your health care provider about any side effects that may make you more likely to have a fall. Tell your health care provider if any medicines that you take make you feel dizzy or sleepy. Strength and balance checks. Your health care provider may recommend certain tests to check your strength and balance while standing, walking, or changing positions. Foot health exam. Foot pain and numbness, as well as not wearing proper footwear, can make you more likely to have a fall. Screenings, including: Osteoporosis screening. Osteoporosis is a condition that causes  the bones to get weaker and break more easily. Blood pressure screening. Blood pressure changes and medicines to control blood pressure can make you feel dizzy. Depression screening. You may be more likely to have a fall if you have a fear of falling, feel depressed, or feel unable to do activities that you used to do. Alcohol use screening. Using too much alcohol can affect your balance and may make you more likely to have a fall. Follow these instructions at home: Lifestyle Do not drink alcohol if: Your health care provider tells you not to drink. If you drink alcohol: Limit how much you have to: 0-1 drink a day for women. 0-2 drinks a day for men. Know how much alcohol is in your drink. In the [place] one drink equals one 12 oz bottle of beer (355 mL), one 5 oz glass of wine (148 mL), or one 1 oz glass of hard liquor (44 mL). Do not use any products that contain nicotine or tobacco. These products include cigarettes, chewing tobacco, and vaping devices, such as e-cigarettes. If you need help quitting, ask your health care provider. Activity  Follow a regular exercise program to stay fit. This will help you maintain your balance. Ask your health care provider what types of exercise are appropriate for you. If you need a cane or walker, use it as recommended by your health care provider. Wear supportive shoes that have nonskid soles. Safety  Remove any tripping hazards, such as rugs, cords, and clutter. Install safety equipment such as grab bars in bathrooms and safety rails on stairs. Keep rooms and walkways   well-lit. General instructions Talk with your health care provider about your risks for falling. Tell your health care provider if: You fall. Be sure to tell your health care provider about all falls, even ones that seem minor. You feel dizzy, tiredness (fatigue), or off-balance. Take over-the-counter and prescription medicines only as told by your health care provider. These include  supplements. Eat a healthy diet and maintain a healthy weight. A healthy diet includes low-fat dairy products, low-fat (lean) meats, and fiber from whole grains, beans, and lots of fruits and vegetables. Stay current with your vaccines. Schedule regular health, dental, and eye exams. Summary Having a healthy lifestyle and getting preventive care can help to protect your health and wellness after age 79. Screening and testing are the best way to find a health problem early and help you avoid having a fall. Early diagnosis and treatment give you the best chance for managing medical conditions that are more common for people who are older than age 79. Falls are a major cause of broken bones and head injuries in people who are older than age 79. Take precautions to prevent a fall at home. Work with your health care provider to learn what changes you can make to improve your health and wellness and to prevent falls. This information is not intended to replace advice given to you by your health care provider. Make sure you discuss any questions you have with your health care provider. Document Revised: 04/17/2021 Document Reviewed: 04/17/2021 Elsevier Patient Education  2024 Elsevier Inc.  

## 2023-07-31 LAB — CBC WITH DIFFERENTIAL/PLATELET
Basophils Absolute: 0 10*3/uL (ref 0.0–0.2)
Basos: 1 %
EOS (ABSOLUTE): 0.1 10*3/uL (ref 0.0–0.4)
Eos: 2 %
Hematocrit: 42.3 % (ref 34.0–46.6)
Hemoglobin: 14.2 g/dL (ref 11.1–15.9)
Immature Grans (Abs): 0 10*3/uL (ref 0.0–0.1)
Immature Granulocytes: 1 %
Lymphocytes Absolute: 2.7 10*3/uL (ref 0.7–3.1)
Lymphs: 31 %
MCH: 31.3 pg (ref 26.6–33.0)
MCHC: 33.6 g/dL (ref 31.5–35.7)
MCV: 93 fL (ref 79–97)
Monocytes Absolute: 0.7 10*3/uL (ref 0.1–0.9)
Monocytes: 8 %
Neutrophils Absolute: 5.2 10*3/uL (ref 1.4–7.0)
Neutrophils: 57 %
Platelets: 257 10*3/uL (ref 150–450)
RBC: 4.53 x10E6/uL (ref 3.77–5.28)
RDW: 12.4 % (ref 11.7–15.4)
WBC: 8.8 10*3/uL (ref 3.4–10.8)

## 2023-07-31 LAB — LIPID PANEL
Chol/HDL Ratio: 2.6 ratio (ref 0.0–4.4)
Cholesterol, Total: 167 mg/dL (ref 100–199)
HDL: 65 mg/dL (ref >39)
LDL Chol Calc (NIH): 84 mg/dL (ref 0–99)
Triglycerides: 100 mg/dL (ref 0–149)
VLDL Cholesterol Cal: 18 mg/dL (ref 5–40)

## 2023-07-31 LAB — CMP14+EGFR
ALT: 14 IU/L (ref 0–32)
AST: 17 IU/L (ref 0–40)
Albumin: 4.3 g/dL (ref 3.8–4.8)
Alkaline Phosphatase: 67 IU/L (ref 44–121)
BUN/Creatinine Ratio: 17 (ref 12–28)
BUN: 19 mg/dL (ref 8–27)
Bilirubin Total: 0.3 mg/dL (ref 0.0–1.2)
CO2: 24 mmol/L (ref 20–29)
Calcium: 9.6 mg/dL (ref 8.7–10.3)
Chloride: 101 mmol/L (ref 96–106)
Creatinine, Ser: 1.13 mg/dL — ABNORMAL HIGH (ref 0.57–1.00)
Globulin, Total: 2.1 g/dL (ref 1.5–4.5)
Glucose: 113 mg/dL — ABNORMAL HIGH (ref 70–99)
Potassium: 4.4 mmol/L (ref 3.5–5.2)
Sodium: 140 mmol/L (ref 134–144)
Total Protein: 6.4 g/dL (ref 6.0–8.5)
eGFR: 49 mL/min/{1.73_m2} — ABNORMAL LOW (ref >59)

## 2023-07-31 LAB — MICROALBUMIN / CREATININE URINE RATIO
Creatinine, Urine: 35.9 mg/dL
Microalb/Creat Ratio: <8 mg/g{creat} (ref 0–29)
Microalbumin, Urine: <3 ug/mL

## 2023-07-31 LAB — TSH: TSH: 1.82 u[IU]/mL (ref 0.450–4.500)

## 2023-08-01 ENCOUNTER — Other Ambulatory Visit: Payer: Self-pay | Admitting: Family Medicine

## 2023-08-01 ENCOUNTER — Other Ambulatory Visit: Payer: Self-pay | Admitting: Family

## 2023-08-01 MED ORDER — SEMAGLUTIDE(0.25 OR 0.5MG/DOS) 2 MG/3ML ~~LOC~~ SOPN
0.2500 mg | PEN_INJECTOR | SUBCUTANEOUS | 0 refills | Status: DC
Start: 2023-08-01 — End: 2023-08-26

## 2023-08-01 NOTE — Addendum Note (Signed)
Addended by: [name] A on: 08/01/2023 10:27 AM   Modules accepted: Orders

## 2023-08-02 ENCOUNTER — Telehealth: Payer: Self-pay | Admitting: Family

## 2023-08-02 NOTE — Telephone Encounter (Signed)
Left message for patient to call back to schedule appt with triage and to bring shot with her.

## 2023-08-20 DIAGNOSIS — M65331 Trigger finger, right middle finger: Secondary | ICD-10-CM | POA: Diagnosis not present

## 2023-08-20 DIAGNOSIS — M65341 Trigger finger, right ring finger: Secondary | ICD-10-CM | POA: Diagnosis not present

## 2023-08-20 DIAGNOSIS — M654 Radial styloid tenosynovitis [de Quervain]: Secondary | ICD-10-CM | POA: Diagnosis not present

## 2023-08-22 ENCOUNTER — Telehealth: Payer: Self-pay | Admitting: Family

## 2023-08-22 NOTE — Telephone Encounter (Signed)
Prolia BIV in separate encounter. Will route encounter back once benefit verification is complete.

## 2023-08-22 NOTE — Telephone Encounter (Signed)
Prolia VOB initiated via [email or web address]  Next Prolia inj DUE: 10/02/23

## 2023-08-22 NOTE — Telephone Encounter (Signed)
Please verify PROLIA benefits for patient.  Thank you.

## 2023-08-26 ENCOUNTER — Telehealth: Payer: Self-pay | Admitting: Family

## 2023-08-26 DIAGNOSIS — E1169 Type 2 diabetes mellitus with other specified complication: Secondary | ICD-10-CM

## 2023-08-26 MED ORDER — SEMAGLUTIDE(0.25 OR 0.5MG/DOS) 2 MG/3ML ~~LOC~~ SOPN
0.2500 mg | PEN_INJECTOR | SUBCUTANEOUS | 0 refills | Status: DC
Start: 2023-08-26 — End: 2023-09-05

## 2023-08-26 NOTE — Telephone Encounter (Signed)
Aware refill sent to pharmacy ?

## 2023-08-26 NOTE — Telephone Encounter (Signed)
  Prescription Request  08/26/2023  Is this a "Controlled Substance" medicine? NO  Have you seen your PCP in the last 2 weeks? NO  If YES, route message to pool  -  If NO, patient needs to be scheduled for appointment.  What is the name of the medication or equipment? Semaglutide 0.25   Have you contacted your pharmacy to request a refill? NO   Which pharmacy would you like this sent to? [facility]   Patient notified that their request is being sent to the clinical staff for review and that they should receive a response within 2 business days.

## 2023-09-02 ENCOUNTER — Other Ambulatory Visit: Payer: Medicare Other

## 2023-09-04 ENCOUNTER — Other Ambulatory Visit (HOSPITAL_COMMUNITY): Payer: Self-pay

## 2023-09-04 NOTE — Telephone Encounter (Signed)
Pt ready for scheduling for PROLIA on or after : 10/02/23  Out-of-pocket cost due at time of visit: $340  Primary: UHC MEDICARE Prolia co-insurance: 20% Admin fee co-insurance: $20  Secondary: --- Prolia co-insurance:  Admin fee co-insurance:   Medical Benefit Details: Date Benefits were checked: 08/22/23 Deductible: NO/ Coinsurance: 20%/ Admin Fee: $20  Prior Auth: APPROVED PA# [id number]  Expiration Date: 03/26/23-03/25/24  # of doses approved:2  Pharmacy benefit: Copay $533.44 If patient wants fill through the pharmacy benefit please send prescription to: [facility], and include estimated need by date in rx notes. Pharmacy will ship medication directly to the office.  Patient NOT eligible for Prolia Copay Card. Copay Card can make patient's cost as little as $25. Link to apply: [email or web address]  ** This summary of benefits is an estimation of the patient's out-of-pocket cost. Exact cost may very based on individual plan coverage.

## 2023-09-05 ENCOUNTER — Ambulatory Visit: Payer: Medicare Other | Admitting: Family

## 2023-09-05 ENCOUNTER — Encounter: Payer: Self-pay | Admitting: Family

## 2023-09-05 ENCOUNTER — Ambulatory Visit (INDEPENDENT_AMBULATORY_CARE_PROVIDER_SITE_OTHER): Payer: Medicare Other

## 2023-09-05 VITALS — BP 129/58 | HR 72 | Temp 97.5°F | Ht 59.0 in | Wt 200.2 lb

## 2023-09-05 DIAGNOSIS — E1169 Type 2 diabetes mellitus with other specified complication: Secondary | ICD-10-CM

## 2023-09-05 DIAGNOSIS — Z7985 Long-term (current) use of injectable non-insulin antidiabetic drugs: Secondary | ICD-10-CM | POA: Diagnosis not present

## 2023-09-05 DIAGNOSIS — M810 Age-related osteoporosis without current pathological fracture: Secondary | ICD-10-CM

## 2023-09-05 DIAGNOSIS — Z0000 Encounter for general adult medical examination without abnormal findings: Secondary | ICD-10-CM

## 2023-09-05 MED ORDER — SEMAGLUTIDE(0.25 OR 0.5MG/DOS) 2 MG/3ML ~~LOC~~ SOPN
0.5000 mg | PEN_INJECTOR | SUBCUTANEOUS | 1 refills | Status: DC
Start: 2023-09-05 — End: 2023-09-25

## 2023-09-05 NOTE — Patient Instructions (Signed)
Calorie Counting for Weight Loss Calories are units of energy. Your body needs a certain number of calories from food to keep going throughout the day. When you eat or drink more calories than your body needs, your body stores the extra calories mostly as fat. When you eat or drink fewer calories than your body needs, your body burns fat to get the energy it needs. Calorie counting means keeping track of how many calories you eat and drink each day. Calorie counting can be helpful if you need to lose weight. If you eat fewer calories than your body needs, you should lose weight. Ask your health care provider what a healthy weight is for you. For calorie counting to work, you will need to eat the right number of calories each day to lose a healthy amount of weight per week. A dietitian can help you figure out how many calories you need in a day and will suggest ways to reach your calorie goal. A healthy amount of weight to lose each week is usually 1-2 lb (0.5-0.9 kg). This usually means that your daily calorie intake should be reduced by 500-750 calories. Eating 1,200-1,500 calories a day can help most women lose weight. Eating 1,500-1,800 calories a day can help most men lose weight. What do I need to know about calorie counting? Work with your health care provider or dietitian to determine how many calories you should get each day. To meet your daily calorie goal, you will need to: Find out how many calories are in each food that you would like to eat. Try to do this before you eat. Decide how much of the food you plan to eat. Keep a food log. Do this by writing down what you ate and how many calories it had. To successfully lose weight, it is important to balance calorie counting with a healthy lifestyle that includes regular activity. Where do I find calorie information?  The number of calories in a food can be found on a Nutrition Facts label. If a food does not have a Nutrition Facts label, try  to look up the calories online or ask your dietitian for help. Remember that calories are listed per serving. If you choose to have more than one serving of a food, you will have to multiply the calories per serving by the number of servings you plan to eat. For example, the label on a package of bread might say that a serving size is 1 slice and that there are 90 calories in a serving. If you eat 1 slice, you will have eaten 90 calories. If you eat 2 slices, you will have eaten 180 calories. How do I keep a food log? After each time that you eat, record the following in your food log as soon as possible: What you ate. Be sure to include toppings, sauces, and other extras on the food. How much you ate. This can be measured in cups, ounces, or number of items. How many calories were in each food and drink. The total number of calories in the food you ate. Keep your food log near you, such as in a pocket-sized notebook or on an app or website on your mobile phone. Some programs will calculate calories for you and show you how many calories you have left to meet your daily goal. What are some portion-control tips? Know how many calories are in a serving. This will help you know how many servings you can have of a certain   food. Use a measuring cup to measure serving sizes. You could also try weighing out portions on a kitchen scale. With time, you will be able to estimate serving sizes for some foods. Take time to put servings of different foods on your favorite plates or in your favorite bowls and cups so you know what a serving looks like. Try not to eat straight from a food's packaging, such as from a bag or box. Eating straight from the package makes it hard to see how much you are eating and can lead to overeating. Put the amount you would like to eat in a cup or on a plate to make sure you are eating the right portion. Use smaller plates, glasses, and bowls for smaller portions and to prevent  overeating. Try not to multitask. For example, avoid watching TV or using your computer while eating. If it is time to eat, sit down at a table and enjoy your food. This will help you recognize when you are full. It will also help you be more mindful of what and how much you are eating. What are tips for following this plan? Reading food labels Check the calorie count compared with the serving size. The serving size may be smaller than what you are used to eating. Check the source of the calories. Try to choose foods that are high in protein, fiber, and vitamins, and low in saturated fat, trans fat, and sodium. Shopping Read nutrition labels while you shop. This will help you make healthy decisions about which foods to buy. Pay attention to nutrition labels for low-fat or fat-free foods. These foods sometimes have the same number of calories or more calories than the full-fat versions. They also often have added sugar, starch, or salt to make up for flavor that was removed with the fat. Make a grocery list of lower-calorie foods and stick to it. Cooking Try to cook your favorite foods in a healthier way. For example, try baking instead of frying. Use low-fat dairy products. Meal planning Use more fruits and vegetables. One-half of your plate should be fruits and vegetables. Include lean proteins, such as chicken, turkey, and fish. Lifestyle Each week, aim to do one of the following: 150 minutes of moderate exercise, such as walking. 75 minutes of vigorous exercise, such as running. General information Know how many calories are in the foods you eat most often. This will help you calculate calorie counts faster. Find a way of tracking calories that works for you. Get creative. Try different apps or programs if writing down calories does not work for you. What foods should I eat?  Eat nutritious foods. It is better to have a nutritious, high-calorie food, such as an avocado, than a food with  few nutrients, such as a bag of potato chips. Use your calories on foods and drinks that will fill you up and will not leave you hungry soon after eating. Examples of foods that fill you up are nuts and nut butters, vegetables, lean proteins, and high-fiber foods such as whole grains. High-fiber foods are foods with more than 5 g of fiber per serving. Pay attention to calories in drinks. Low-calorie drinks include water and unsweetened drinks. The items listed above may not be a complete list of foods and beverages you can eat. Contact a dietitian for more information. What foods should I limit? Limit foods or drinks that are not good sources of vitamins, minerals, or protein or that are high in unhealthy fats. These   include: Candy. Other sweets. Sodas, specialty coffee drinks, alcohol, and juice. The items listed above may not be a complete list of foods and beverages you should avoid. Contact a dietitian for more information. How do I count calories when eating out? Pay attention to portions. Often, portions are much larger when eating out. Try these tips to keep portions smaller: Consider sharing a meal instead of getting your own. If you get your own meal, eat only half of it. Before you start eating, ask for a container and put half of your meal into it. When available, consider ordering smaller portions from the menu instead of full portions. Pay attention to your food and drink choices. Knowing the way food is cooked and what is included with the meal can help you eat fewer calories. If calories are listed on the menu, choose the lower-calorie options. Choose dishes that include vegetables, fruits, whole grains, low-fat dairy products, and lean proteins. Choose items that are boiled, broiled, grilled, or steamed. Avoid items that are buttered, battered, fried, or served with cream sauce. Items labeled as crispy are usually fried, unless stated otherwise. Choose water, low-fat milk,  unsweetened iced tea, or other drinks without added sugar. If you want an alcoholic beverage, choose a lower-calorie option, such as a glass of wine or light beer. Ask for dressings, sauces, and syrups on the side. These are usually high in calories, so you should limit the amount you eat. If you want a salad, choose a garden salad and ask for grilled meats. Avoid extra toppings such as bacon, cheese, or fried items. Ask for the dressing on the side, or ask for olive oil and vinegar or lemon to use as dressing. Estimate how many servings of a food you are given. Knowing serving sizes will help you be aware of how much food you are eating at restaurants. Where to find more information Centers for Disease Control and Prevention: www.cdc.gov U.[place]. Department of Agriculture: myplate.gov Summary Calorie counting means keeping track of how many calories you eat and drink each day. If you eat fewer calories than your body needs, you should lose weight. A healthy amount of weight to lose per week is usually 1-2 lb (0.5-0.9 kg). This usually means reducing your daily calorie intake by 500-750 calories. The number of calories in a food can be found on a Nutrition Facts label. If a food does not have a Nutrition Facts label, try to look up the calories online or ask your dietitian for help. Use smaller plates, glasses, and bowls for smaller portions and to prevent overeating. Use your calories on foods and drinks that will fill you up and not leave you hungry shortly after a meal. This information is not intended to replace advice given to you by your health care provider. Make sure you discuss any questions you have with your health care provider. Document Revised: 01/07/2020 Document Reviewed: 01/07/2020 Elsevier Patient Education  2023 Elsevier Inc.  

## 2023-09-05 NOTE — Progress Notes (Signed)
Subjective:    Patient ID: Robin Lindsey, female    DOB: 1946-06-15, 77 y.o.   MRN: [id number]  Chief Complaint  Patient presents with   Follow-up   Pt presents to the office to follow up on Ozempic 0.25 mg weekly. She has lost 10 lbs. Denies any nausea, vomiting, constipation or diarrhea at this time.     09/05/2023    3:14 PM 07/30/2023   12:34 PM 06/10/2023    2:08 PM  Last 3 Weights  Weight (lbs) 200 lb 3.2 oz 210 lb 211 lb  Weight (kg) 90.81 kg 95.255 kg 95.709 kg     Diabetes She presents for her follow-up diabetic visit. Associated symptoms include foot paresthesias. Pertinent negatives for diabetes include no blurred vision. Diabetic complications include peripheral neuropathy. Risk factors for coronary artery disease include diabetes mellitus, dyslipidemia, hypertension, sedentary lifestyle and post-menopausal. She is following a generally unhealthy diet. (Does not check glucose regularly )      Review of Systems  Eyes:  Negative for blurred vision.  All other systems reviewed and are negative.      Objective:   Physical Exam Vitals reviewed.  Constitutional:      General: She is not in acute distress.    Appearance: She is well-developed. She is obese.  HENT:     Head: Normocephalic and atraumatic.     Right Ear: Tympanic membrane normal.     Left Ear: Tympanic membrane normal.  Eyes:     Pupils: Pupils are equal, round, and reactive to light.  Neck:     Thyroid: No thyromegaly.  Cardiovascular:     Rate and Rhythm: Normal rate and regular rhythm.     Heart sounds: Normal heart sounds. No murmur heard. Pulmonary:     Effort: Pulmonary effort is normal. No respiratory distress.     Breath sounds: Normal breath sounds. No wheezing.  Abdominal:     General: Bowel sounds are normal. There is no distension.     Palpations: Abdomen is soft.     Tenderness: There is no abdominal tenderness.  Musculoskeletal:        General: No tenderness. Normal range of  motion.     Cervical back: Normal range of motion and neck supple.  Skin:    General: Skin is warm and dry.  Neurological:     Mental Status: She is alert and oriented to person, place, and time.     Cranial Nerves: No cranial nerve deficit.     Deep Tendon Reflexes: Reflexes are normal and symmetric.  Psychiatric:        Behavior: Behavior normal.        Thought Content: Thought content normal.        Judgment: Judgment normal.       BP (!) 129/58   Pulse 72   Temp (!) 97.5 F (36.4 C) (Temporal)   Ht 4\' 11"  (1.499 m)   Wt 200 lb 3.2 oz (90.8 kg)   SpO2 98%   BMI 40.44 kg/m      Assessment & Plan:  Robin Lindsey comes in today with chief complaint of Follow-up   Diagnosis and orders addressed:  1. Type 2 diabetes mellitus with other specified complication, without long-term current use of insulin (HCC) - Semaglutide,0.25 or 0.5MG /DOS, 2 MG/3ML SOPN; Inject 0.5 mg into the skin once a week.  Dispense: 3 mL; Refill: 1  Increase Ozempic 0.5mg   Low carb diet  Encourage healthy diet and exercise  Follow up in 2 months    [name], FNP

## 2023-09-06 DIAGNOSIS — Z780 Asymptomatic menopausal state: Secondary | ICD-10-CM | POA: Diagnosis not present

## 2023-09-06 DIAGNOSIS — M8589 Other specified disorders of bone density and structure, multiple sites: Secondary | ICD-10-CM | POA: Diagnosis not present

## 2023-09-19 NOTE — Telephone Encounter (Signed)
Prolia ordered and patient has appointment 10/25.

## 2023-09-24 ENCOUNTER — Encounter (HOSPITAL_BASED_OUTPATIENT_CLINIC_OR_DEPARTMENT_OTHER): Payer: Self-pay | Admitting: Cardiology

## 2023-09-24 ENCOUNTER — Ambulatory Visit (HOSPITAL_BASED_OUTPATIENT_CLINIC_OR_DEPARTMENT_OTHER): Payer: Medicare Other | Admitting: Cardiology

## 2023-09-24 VITALS — BP 132/70 | HR 65 | Ht 59.0 in | Wt 197.0 lb

## 2023-09-24 DIAGNOSIS — E1169 Type 2 diabetes mellitus with other specified complication: Secondary | ICD-10-CM

## 2023-09-24 DIAGNOSIS — R0609 Other forms of dyspnea: Secondary | ICD-10-CM | POA: Diagnosis not present

## 2023-09-24 DIAGNOSIS — I2510 Atherosclerotic heart disease of native coronary artery without angina pectoris: Secondary | ICD-10-CM

## 2023-09-24 DIAGNOSIS — I420 Dilated cardiomyopathy: Secondary | ICD-10-CM

## 2023-09-24 DIAGNOSIS — I10 Essential (primary) hypertension: Secondary | ICD-10-CM

## 2023-09-24 MED ORDER — DAPAGLIFLOZIN PROPANEDIOL 10 MG PO TABS: 10.0000 mg | ORAL_TABLET | Freq: Every day | ORAL | Status: DC

## 2023-09-24 NOTE — Patient Instructions (Signed)
Medication Instructions:  Your physician recommends that you continue on your current medications as directed. Please refer to the Current Medication list given to you today.   *If you need a refill on your cardiac medications before your next appointment, please call your pharmacy*  Lab Work: NONE  Testing/Procedures: NONE   Follow-Up: At [facility], you and your health needs are our priority.  As part of our continuing mission to provide you with exceptional heart care, we have created designated Provider Care Teams.  These Care Teams include your primary Cardiologist (physician) and Advanced Practice Providers (APPs -  Physician Assistants and Nurse Practitioners) who all work together to provide you with the care you need, when you need it.  We recommend signing up for the patient portal called "MyChart".  Sign up information is provided on this After Visit Summary.  MyChart is used to connect with patients for Virtual Visits (Telemedicine).  Patients are able to view lab/test results, encounter notes, upcoming appointments, etc.  Non-urgent messages can be sent to your provider as well.   To learn more about what you can do with MyChart, go to [email or web address].    Your next appointment:   6 month(s)  The format for your next appointment:   In Person  Provider:   Bridgette Christopher, MD             

## 2023-09-24 NOTE — Progress Notes (Signed)
Cardiology Office Note:  .    Date:  09/24/2023  ID:  Robin Lindsey, DOB [birth year], MRN [id number] PCP: Junie Spencer, FNP  [facility] Providers Cardiologist:  Jodelle Red, MD     History of Present Illness: .    Robin Lindsey is a 77 y.o. female with a hx of nonischemic cardiomyopathy diagnosed 2024, hypertension, diabetes, who is seen for follow up today. I initially met her 02/18/23 as a new consult at the request of Junie Spencer, FNP for the evaluation and management of dyspnea on exertion.   Cardiovascular History: Echo on 03/14/23 showed 3D EF 42% without regional wall abnormalities. ETT with 4.8 METs without ischemic changes. Cath 05/22/2023 revealed no aortic valve stenosis and angiographically normal coronary arteries. There was borderline pulmonary hypertension with mean PA and PCWP of 24 mmHg; LVEDP only 16 mmHg.    At her visit 06/2023 she complained of ongoing yet stable shortness of breath. LE swelling was relatively unchanged: frequently persistent but less severe in the mornings. Home blood pressures were stable. For about 2 weeks she had tried taking the metoprolol but then stopped taking it as it made her feel very fatigued and worn out. She agreed to trial a reduced dose of 12.5 mg metoprolol daily. Discussed SGLT2i and we started her on Jardiance.  Today, she states she is doing well. At this time she has taken 8 doses of Ozempic, currently on the 0.5 mg dose. She has lost 10-12 lbs, although there has been no significant change in her weight from the last 4 injections. For a time she had some nausea/gas but this seems to have resolved.  Now she is in the donut hole, so she is concerned that she will no longer be able to afford the Ozempic.  While walking she is still short of breath. However, she has noticed improvement in being able to walk farther without feeling as winded. Recently able to walk from one end of the grocery store to the other without  significant symptoms. Usually she tries to walk routinely, sometimes needs to stop while walking her long driveway.  She has tried taking the reduced 12.5 mg dose of metoprolol but was unable to tolerate this. After 2 days of taking it she will feel significantly fatigued and worn out.  She denies any palpitations, chest pain, peripheral edema, lightheadedness, headaches, syncope, orthopnea, or PND.  ROS:  Please see the history of present illness. ROS otherwise negative except as noted.  (+) Exertional shortness of breath  Studies Reviewed: Marland Kitchen         Physical Exam:    VS:  BP 132/70   Pulse 65   Ht 4\' 11"  (1.499 m)   Wt 197 lb (89.4 kg)   SpO2 (!) 65%   BMI 39.79 kg/m    Wt Readings from Last 3 Encounters:  09/24/23 197 lb (89.4 kg)  09/05/23 200 lb 3.2 oz (90.8 kg)  07/30/23 210 lb (95.3 kg)    GEN: Well nourished, well developed in no acute distress HEENT: Normal, moist mucous membranes NECK: No JVD CARDIAC: regular rhythm, normal S1 and S2, no rubs or gallops. No murmur. VASCULAR: Radial and DP pulses 2+ bilaterally. No carotid bruits RESPIRATORY:  Clear to auscultation without rales, wheezing or rhonchi  ABDOMEN: Soft, non-tender, non-distended MUSCULOSKELETAL:  Ambulates independently SKIN: Warm and dry, no edema NEUROLOGIC:  Alert and oriented x 3. No focal neuro deficits noted. PSYCHIATRIC:  Normal affect   ASSESSMENT  AND PLAN: .    Cardiomyopathy, nonsichemic -on losartan -did not tolerate 25 or 12.5 mg of metoprolol; discontinue -tolerating Jardiance, though cost will be an issue as she is now in the donut hole. We have Farxiga samples in the short term, and she will look into cost for the rest of the year -next med would be spironolactone if K allows   Dyspnea on exertion Nonobstructive CAD Aortic atherosclerosis Hypercholesterolemia -minimal CAD on CT, angiographically normal on cath -tolerating pravastatin -aspirin long term upsets her stomach, but no  bleeding. -breathing has improved as she has lost weight, but with cost of semaglutide while in the donut hole, she is concerned she may have to stop this and restart in January   Hypertension -continue valsartan-hctz   Type II diabetes -SGLT2i, semaglutide as above   Cardiac risk counseling and prevention recommendations: -recommend heart healthy/Mediterranean diet, with whole grains, fruits, vegetable, fish, lean meats, nuts, and olive oil. Limit salt. -recommend moderate walking, 3-5 times/week for 30-50 minutes each session. Aim for at least 150 minutes.week. Goal should be pace of 3 miles/hours, or walking 1.5 miles in 30 minutes -recommend avoidance of tobacco products. Avoid excess alcohol.  Dispo: Follow-up in 6 months, or sooner as needed.  I,Mathew Stumpf,acting as a Neurosurgeon for [employer], MD.,have documented all relevant documentation on the behalf of Jodelle Red, MD,as directed by  Jodelle Red, MD while in the presence of Jodelle Red, MD.  I, Jodelle Red, MD, have reviewed all documentation for this visit. The documentation on 09/24/23 for the exam, diagnosis, procedures, and orders are all accurate and complete.   Signed, Jodelle Red, MD

## 2023-09-25 ENCOUNTER — Telehealth: Payer: Self-pay | Admitting: Family

## 2023-09-25 ENCOUNTER — Other Ambulatory Visit: Payer: Self-pay | Admitting: Family Medicine

## 2023-09-25 ENCOUNTER — Telehealth: Payer: Self-pay | Admitting: Cardiology

## 2023-09-25 MED ORDER — OZEMPIC (1 MG/DOSE) 4 MG/3ML ~~LOC~~ SOPN: 1.0000 mg | PEN_INJECTOR | SUBCUTANEOUS | 5 refills | Status: DC

## 2023-09-25 MED ORDER — EMPAGLIFLOZIN 10 MG PO TABS: 10.0000 mg | ORAL_TABLET | Freq: Every day | ORAL | Status: DC

## 2023-09-25 NOTE — Telephone Encounter (Signed)
Pt c/o medication issue:  1. Name of Medication:   empagliflozin (JARDIANCE) 10 MG TABS tablet    2. How are you currently taking this medication (dosage and times per day)?    3. Are you having a reaction (difficulty breathing--STAT)? no  4. What is your medication issue? Patient states that she is in the donut whole with her insurance for prescription. And unable to afford it. Patient was told if she get the prescription for 25mg  then she can cut the meds in half. And would allow her to be able to affor it. Please advise

## 2023-09-25 NOTE — Telephone Encounter (Signed)
Will forward to Pharm D for review

## 2023-09-25 NOTE — Telephone Encounter (Signed)
Please let the patient know that I sent their prescription to their pharmacy. Thanks, WS

## 2023-09-25 NOTE — Telephone Encounter (Signed)
Would need to confirm MD is ok with pt temporarily changing to Jardiance 25mg  daily as rx would need to say to take 1 tab daily for them to fill 30 tabs as a 30 day supply, however she would be cutting them in half and taking 12.5mg  daily. If rx read to take 1/2 tablet daily as she would actually be doing, the pharmacy would dispense 15 tabs as a 30 day supply which wouldn't help stretch the med until January when her plan resets.

## 2023-09-26 MED ORDER — EMPAGLIFLOZIN 25 MG PO TABS: ORAL_TABLET | ORAL | 3 refills | Status: DC

## 2023-09-26 NOTE — Telephone Encounter (Signed)
Discussed with Dr Cristal Deer and ok to use the Jardiance 25 mg 1/2 tablet daily through the end of year. Also ok to use the [place] she has   Advised patient, verbalized understanding

## 2023-10-04 ENCOUNTER — Ambulatory Visit (INDEPENDENT_AMBULATORY_CARE_PROVIDER_SITE_OTHER): Payer: Medicare Other

## 2023-10-04 DIAGNOSIS — M810 Age-related osteoporosis without current pathological fracture: Secondary | ICD-10-CM

## 2023-10-04 MED ORDER — DENOSUMAB 60 MG/ML ~~LOC~~ SOSY
60.0000 mg | PREFILLED_SYRINGE | Freq: Once | SUBCUTANEOUS | Status: AC
Start: 2023-10-04 — End: 2023-10-04
  Administered 2023-10-04: 60 mg via SUBCUTANEOUS

## 2023-10-04 NOTE — Progress Notes (Signed)
Prolia injection given to left upper arm.  Patient tolerated well.  Buy and bill

## 2023-10-18 ENCOUNTER — Telehealth: Payer: Self-pay | Admitting: Family Medicine

## 2023-10-18 MED ORDER — ONDANSETRON HCL 4 MG PO TABS: 4.0000 mg | ORAL_TABLET | Freq: Three times a day (TID) | ORAL | 1 refills | Status: DC | PRN

## 2023-10-18 NOTE — Telephone Encounter (Signed)
Zofran Prescription sent to pharmacy, if nausea continues may need to decrease dose.   [name], FNP

## 2023-10-18 NOTE — Telephone Encounter (Signed)
Copied from CRM [id number]. Topic: Clinical - Medical Advice >> Oct 18, 2023  9:34 AM Robin Lindsey wrote: Reason for CRM: Patient states she on ozempic and experiencing nausea and would like to know if there is anything she can take to help reduce the nausea.

## 2023-10-18 NOTE — Telephone Encounter (Signed)
Patient aware and verbalized understanding. °

## 2023-10-21 DIAGNOSIS — E119 Type 2 diabetes mellitus without complications: Secondary | ICD-10-CM | POA: Diagnosis not present

## 2023-10-21 DIAGNOSIS — Z961 Presence of intraocular lens: Secondary | ICD-10-CM | POA: Diagnosis not present

## 2023-10-21 DIAGNOSIS — H353131 Nonexudative age-related macular degeneration, bilateral, early dry stage: Secondary | ICD-10-CM | POA: Diagnosis not present

## 2023-10-21 DIAGNOSIS — H401111 Primary open-angle glaucoma, right eye, mild stage: Secondary | ICD-10-CM | POA: Diagnosis not present

## 2023-10-21 DIAGNOSIS — H401123 Primary open-angle glaucoma, left eye, severe stage: Secondary | ICD-10-CM | POA: Diagnosis not present

## 2023-10-21 LAB — HM DIABETES EYE EXAM

## 2023-11-05 ENCOUNTER — Ambulatory Visit: Payer: Medicare Other | Admitting: Family

## 2023-11-19 ENCOUNTER — Ambulatory Visit: Payer: Medicare Other | Admitting: Family Medicine

## 2023-11-19 ENCOUNTER — Encounter: Payer: Self-pay | Admitting: Family Medicine

## 2023-11-19 VITALS — BP 128/65 | HR 80 | Temp 97.2°F | Ht 59.0 in | Wt 185.0 lb

## 2023-11-19 DIAGNOSIS — R300 Dysuria: Secondary | ICD-10-CM

## 2023-11-19 LAB — URINALYSIS, COMPLETE
Bilirubin, UA: NEGATIVE
Ketones, UA: NEGATIVE
Nitrite, UA: NEGATIVE
Protein,UA: NEGATIVE
RBC, UA: NEGATIVE
Specific Gravity, UA: 1.01 (ref 1.005–1.030)
Urobilinogen, Ur: 0.2 mg/dL (ref 0.2–1.0)
pH, UA: 7 (ref 5.0–7.5)

## 2023-11-19 LAB — MICROSCOPIC EXAMINATION

## 2023-11-19 MED ORDER — AMOXICILLIN 500 MG PO CAPS: 500.0000 mg | ORAL_CAPSULE | Freq: Three times a day (TID) | ORAL | 0 refills | Status: DC

## 2023-11-19 NOTE — Progress Notes (Signed)
burning with urination and frequency for several days. Denies fever . No flank pain. No nausea, vomiting.   Subjective:  Patient ID: Robin Lindsey, female    DOB: Sep 16, 1946  Age: 77 y.o. MRN: [id number]  CC: Urinary Tract Infection   HPI Robin Lindsey presents for burning with urination and frequency for several days. Denies fever . No flank pain. No nausea, vomiting.      11/19/2023    2:00 PM 07/30/2023   12:36 PM 03/22/2023   10:36 AM  Depression screen PHQ 2/9  Decreased Interest 0 0 0  Down, Depressed, Hopeless 0 0 0  PHQ - 2 Score 0 0 0  Altered sleeping  0 0  Tired, decreased energy  2 3  Change in appetite  0 0  Feeling bad or failure about yourself   0 0  Trouble concentrating  0 0  Moving slowly or fidgety/restless  0 0  Suicidal thoughts  0 0  PHQ-9 Score  2 3  Difficult doing work/chores  Somewhat difficult Not difficult at all    History Robin Lindsey has a past medical history of Arthritis, Cataract, Diabetes mellitus without complication (HCC), GERD (gastroesophageal reflux disease), Glaucoma, Heel pain, Hypertension, Osteoporosis, Personal history of colonic polyps (12/09/2012), and Plantar fasciitis.   She has a past surgical history that includes Breast lumpectomy (10 + yrs.); Cataract extraction w/PHACO (02/14/2012); Cataract extraction w/PHACO (03/03/2012); Colonoscopy; percutaneous micrfasciotomy with topaz coblation wand (12/11/2015); rt foot surgery (Right); and RIGHT/LEFT HEART CATH AND CORONARY ANGIOGRAPHY (N/A, 05/22/2023).   Her family history includes Colon cancer in her maternal aunt; Colon polyps in her mother; Dementia in her father; Diabetes in her brother and mother; Heart disease in her father; Hypertension in her brother; Lung disease in her father; Meniere's disease in her brother; Pulmonary fibrosis in her maternal aunt, maternal aunt, and mother; Stroke in her father; Thyroid disease in her brother.She reports that she quit smoking about 56 years  ago. Her smoking use included cigarettes. She started smoking about 61 years ago. She has a 5 pack-year smoking history. She has never used smokeless tobacco. She reports that she does not drink alcohol and does not use drugs.    ROS Review of Systems  Constitutional:  Negative for chills, diaphoresis and fever.  HENT:  Negative for congestion.   Eyes:  Negative for visual disturbance.  Respiratory:  Negative for cough and shortness of breath.   Cardiovascular:  Negative for chest pain and palpitations.  Gastrointestinal:  Negative for constipation, diarrhea and nausea.  Genitourinary:  Positive for dysuria, frequency and urgency. Negative for decreased urine volume, flank pain, hematuria, menstrual problem and pelvic pain.  Musculoskeletal:  Negative for arthralgias and joint swelling.  Skin:  Negative for rash.  Neurological:  Negative for dizziness and numbness.    Objective:  BP 128/65   Pulse 80   Temp (!) 97.2 F (36.2 C)   Ht 4\' 11"  (1.499 m)   Wt 185 lb (83.9 kg)   SpO2 97%   BMI 37.37 kg/m   BP Readings from Last 3 Encounters:  11/19/23 128/65  09/24/23 132/70  09/05/23 (!) 129/58    Wt Readings from Last 3 Encounters:  11/19/23 185 lb (83.9 kg)  09/24/23 197 lb (89.4 kg)  09/05/23 200 lb 3.2 oz (90.8 kg)     Physical Exam Constitutional:      Appearance: She is well-developed.  HENT:     Head: Normocephalic and atraumatic.  Cardiovascular:  Rate and Rhythm: Normal rate and regular rhythm.     Heart sounds: No murmur heard. Pulmonary:     Effort: Pulmonary effort is normal.     Breath sounds: Normal breath sounds.  Abdominal:     General: Bowel sounds are normal.     Palpations: Abdomen is soft. There is no mass.     Tenderness: There is no abdominal tenderness. There is no guarding or rebound.  Musculoskeletal:        General: No tenderness.  Skin:    General: Skin is warm and dry.  Neurological:     Mental Status: She is alert and oriented  to person, place, and time.  Psychiatric:        Behavior: Behavior normal.       Assessment & Plan:   Robin Lindsey was seen today for urinary tract infection.  Diagnoses and all orders for this visit:  Dysuria -     Urinalysis, Complete -     Urine Culture  Other orders -     amoxicillin (AMOXIL) 500 MG capsule; Take 1 capsule (500 mg total) by mouth 3 (three) times daily.       I am having Robin Lindsey start on amoxicillin. I am also having her maintain her Lumigan, denosumab, Co Q-10, pravastatin, omeprazole, valsartan-hydrochlorothiazide, Ozempic (1 MG/DOSE), empagliflozin, and ondansetron. We will continue to administer denosumab.  Allergies as of 11/19/2023       Reactions   Metformin And Related Nausea And Vomiting   Aspirin Nausea Only, Other (See Comments)   pain   Nsaids    Stomach pain        Medication List        Accurate as of November 19, 2023  2:38 PM. If you have any questions, ask your nurse or doctor.          amoxicillin 500 MG capsule Commonly known as: AMOXIL Take 1 capsule (500 mg total) by mouth 3 (three) times daily. Started by: Jocilynn Grade   Co Q-10 50 MG Caps Take 50 mg by mouth in the morning.   denosumab 60 MG/ML Sosy injection Commonly known as: PROLIA Inject 60 mg into the skin every 6 (six) months.   empagliflozin 25 MG Tabs tablet Commonly known as: JARDIANCE Take 1/2 tablet daily   Lumigan 0.01 % Soln Generic drug: bimatoprost Place 1 drop into both eyes at bedtime.   omeprazole 20 MG capsule Commonly known as: PRILOSEC TAKE ONE (1) CAPSULE BY MOUTH 2 TIMES DAILY   ondansetron 4 MG tablet Commonly known as: Zofran Take 1 tablet (4 mg total) by mouth every 8 (eight) hours as needed for nausea or vomiting.   Ozempic (1 MG/DOSE) 4 MG/3ML Sopn Generic drug: Semaglutide (1 MG/DOSE) Inject 1 mg into the skin once a week.   pravastatin 10 MG tablet Commonly known as: PRAVACHOL Take 1 tablet (10 mg total) by  mouth daily. What changed: when to take this   valsartan-hydrochlorothiazide 160-25 MG tablet Commonly known as: DIOVAN-HCT TAKE ONE (1) TABLET EACH DAY         Follow-up: Return if symptoms worsen or fail to improve.  Mechele Claude, M.D.

## 2023-11-22 DIAGNOSIS — M65331 Trigger finger, right middle finger: Secondary | ICD-10-CM | POA: Diagnosis not present

## 2023-11-22 DIAGNOSIS — M654 Radial styloid tenosynovitis [de Quervain]: Secondary | ICD-10-CM | POA: Diagnosis not present

## 2023-11-22 DIAGNOSIS — M65341 Trigger finger, right ring finger: Secondary | ICD-10-CM | POA: Diagnosis not present

## 2023-11-25 LAB — URINE CULTURE

## 2023-12-02 ENCOUNTER — Ambulatory Visit: Payer: Medicare Other

## 2023-12-02 VITALS — Ht 59.0 in | Wt 185.0 lb

## 2023-12-02 DIAGNOSIS — Z0000 Encounter for general adult medical examination without abnormal findings: Secondary | ICD-10-CM | POA: Diagnosis not present

## 2023-12-02 NOTE — Patient Instructions (Signed)
Robin Lindsey , Thank you for taking time to come for your Medicare Wellness Visit. I appreciate your ongoing commitment to your health goals. Please review the following plan we discussed and let me know if I can assist you in the future.   Referrals/Orders/Follow-Ups/Clinician Recommendations: Aim for 30 minutes of exercise or brisk walking, 6-8 glasses of water, and 5 servings of fruits and vegetables each day.  This is a list of the screening recommended for you and due dates:  Health Maintenance  Topic Date Due   DTaP/Tdap/Td vaccine (1 - Tdap) Never done   Complete foot exam   07/13/2022   COVID-19 Vaccine (3 - 2024-25 season) 08/11/2023   Flu Shot  03/09/2024*   Hemoglobin A1C  01/30/2024   Yearly kidney function blood test for diabetes  07/29/2024   Yearly kidney health urinalysis for diabetes  07/29/2024   Eye exam for diabetics  10/20/2024   Medicare Annual Wellness Visit  12/01/2024   DEXA scan (bone density measurement)  09/05/2025   Colon Cancer Screening  07/21/2026   Pneumonia Vaccine  Completed   Hepatitis C Screening  Completed   Zoster (Shingles) Vaccine  Completed   HPV Vaccine  Aged Out  *Topic was postponed. The date shown is not the original due date.    Advanced directives: (ACP Link)Information on Advanced Care Planning can be found at [facility] [place] Advance Health Care Directives Advance Health Care Directives ([email or web address]   Next Medicare Annual Wellness Visit scheduled for next year: Yes

## 2023-12-02 NOTE — Progress Notes (Signed)
Subjective:   Robin Lindsey is a 77 y.o. female who presents for Medicare Annual (Subsequent) preventive examination.  Visit Complete: Virtual I connected with  Tommy Rainwater on 12/02/23 by a audio enabled telemedicine application and verified that I am speaking with the correct person using two identifiers.  Patient Location: Home  Provider Location: Home Office  I discussed the limitations of evaluation and management by telemedicine. The patient expressed understanding and agreed to proceed.  Vital Signs: Because this visit was a virtual/telehealth visit, some criteria may be missing or patient reported. Any vitals not documented were not able to be obtained and vitals that have been documented are patient reported.  Cardiac Risk Factors include: advanced age (>20men, >84 women);hypertension     Objective:    Today's Vitals   12/02/23 1315  Weight: 185 lb (83.9 kg)  Height: 4\' 11"  (1.499 m)   Body mass index is 37.37 kg/m.     12/02/2023    1:53 PM 05/22/2023    7:52 AM 11/26/2022    2:36 PM 09/07/2021    2:37 PM 08/19/2019   10:22 AM 08/14/2018    2:57 PM 05/21/2016   11:28 AM  Advanced Directives  Does Patient Have a Medical Advance Directive? No No Yes No No No No  Type of Advance Directive   Living will;Healthcare Power of Attorney      Does patient want to make changes to medical advance directive?   No - Patient declined      Copy of Healthcare Power of Attorney in Chart?   No - copy requested      Would patient like information on creating a medical advance directive? Yes (MAU/Ambulatory/Procedural Areas - Information given) No - Patient declined  No - Patient declined No - Patient declined No - Patient declined Yes - Educational materials given    Current Medications (verified) Outpatient Encounter Medications as of 12/02/2023  Medication Sig   Coenzyme Q10 (CO Q-10) 50 MG CAPS Take 50 mg by mouth in the morning.   denosumab (PROLIA) 60 MG/ML SOSY injection  Inject 60 mg into the skin every 6 (six) months.   empagliflozin (JARDIANCE) 25 MG TABS tablet Take 1/2 tablet daily   LUMIGAN 0.01 % SOLN Place 1 drop into both eyes at bedtime.   omeprazole (PRILOSEC) 20 MG capsule TAKE ONE (1) CAPSULE BY MOUTH 2 TIMES DAILY   ondansetron (ZOFRAN) 4 MG tablet Take 1 tablet (4 mg total) by mouth every 8 (eight) hours as needed for nausea or vomiting.   pravastatin (PRAVACHOL) 10 MG tablet Take 1 tablet (10 mg total) by mouth daily. (Patient taking differently: Take 10 mg by mouth at bedtime.)   valsartan-hydrochlorothiazide (DIOVAN-HCT) 160-25 MG tablet TAKE ONE (1) TABLET EACH DAY   amoxicillin (AMOXIL) 500 MG capsule Take 1 capsule (500 mg total) by mouth 3 (three) times daily. (Patient not taking: Reported on 12/02/2023)   Semaglutide, 1 MG/DOSE, (OZEMPIC, 1 MG/DOSE,) 4 MG/3ML SOPN Inject 1 mg into the skin once a week. (Patient not taking: Reported on 12/02/2023)   Facility-Administered Encounter Medications as of 12/02/2023  Medication   denosumab (PROLIA) injection 60 mg    Allergies (verified) Metformin and related, Aspirin, and Nsaids   History: Past Medical History:  Diagnosis Date   Arthritis    Cataract    Diabetes mellitus without complication (HCC)    pt is not on diabetic medication   GERD (gastroesophageal reflux disease)    Glaucoma  worse in left eye than right eye   Heel pain    right heel pain   Hypertension    Osteoporosis    Personal history of colonic polyps 12/09/2012   Plantar fasciitis    right foot   Past Surgical History:  Procedure Laterality Date   BREAST LUMPECTOMY  10 + yrs.   left breast/benign   CATARACT EXTRACTION W/PHACO  02/14/2012   Procedure: CATARACT EXTRACTION PHACO AND INTRAOCULAR LENS PLACEMENT (IOC);  Surgeon: Gemma Payor, MD;  Location: [facility];  Service: Ophthalmology;  Laterality: Left;  CDE 13.73   CATARACT EXTRACTION W/PHACO  03/03/2012   Procedure: CATARACT EXTRACTION PHACO AND INTRAOCULAR  LENS PLACEMENT (IOC);  Surgeon: Gemma Payor, MD;  Location: [facility];  Service: Ophthalmology;  Laterality: Right;  CDE: 16.49   COLONOSCOPY     percutaneous micrfasciotomy with topaz coblation wand  12/11/2015   RIGHT/LEFT HEART CATH AND CORONARY ANGIOGRAPHY N/A 05/22/2023   Procedure: RIGHT/LEFT HEART CATH AND CORONARY ANGIOGRAPHY;  Surgeon: Marykay Lex, MD;  [facility];  Service: Cardiovascular;  Laterality: N/A;   rt foot surgery Right    Family History  Problem Relation Age of Onset   Colon polyps Mother    Diabetes Mother    Pulmonary fibrosis Mother    Colon cancer Maternal Aunt    Pulmonary fibrosis Maternal Aunt    Heart disease Father    Stroke Father    Dementia Father    Lung disease Father        realted to asbestos exposure   Thyroid disease Brother    Diabetes Brother    Hypertension Brother    Meniere's disease Brother    Pulmonary fibrosis Maternal Aunt    Anesthesia problems Neg Hx    Hypotension Neg Hx    Malignant hyperthermia Neg Hx    Pseudochol deficiency Neg Hx    Social History   Socioeconomic History   Marital status: Married    Spouse name: Louis   Number of children: 3   Years of education: 12   Highest education level: 12th grade  Occupational History   Occupation: retired  Tobacco Use   Smoking status: Former    Current packs/day: 0.00    Average packs/day: 1 pack/day for 5.0 years (5.0 ttl pk-yrs)    Types: Cigarettes    Start date: 03/10/1962    Quit date: 03/11/1967    Years since quitting: 56.7   Smokeless tobacco: Never   Tobacco comments:    smoked for 4-5 years from age 67- 2 years old  Vaping Use   Vaping status: Never Used  Substance and Sexual Activity   Alcohol use: No    Alcohol/week: 0.0 standard drinks of alcohol   Drug use: No   Sexual activity: Not Currently  Other Topics Concern   Not on file  Social History Narrative   Not on file   Social Drivers of Health   Financial Resource Strain: Low  Risk  (12/02/2023)   Overall Financial Resource Strain (CARDIA)    Difficulty of Paying Living Expenses: Not hard at all  Food Insecurity: No Food Insecurity (12/02/2023)   Hunger Vital Sign    Worried About Running Out of Food in the Last Year: Never true    Ran Out of Food in the Last Year: Never true  Transportation Needs: No Transportation Needs (12/02/2023)   PRAPARE - Administrator, Civil Service (Medical): No    Lack of Transportation (  Non-Medical): No  Physical Activity: Insufficiently Active (12/02/2023)   Exercise Vital Sign    Days of Exercise per Week: 3 days    Minutes of Exercise per Session: 30 min  Stress: No Stress Concern Present (12/02/2023)   [employer] of Occupational Health - Occupational Stress Questionnaire    Feeling of Stress : Not at all  Social Connections: Moderately Integrated (12/02/2023)   Social Connection and Isolation Panel [NHANES]    Frequency of Communication with Friends and Family: More than three times a week    Frequency of Social Gatherings with Friends and Family: Three times a week    Attends Religious Services: More than 4 times per year    Active Member of Clubs or Organizations: No    Attends Banker Meetings: Never    Marital Status: Married    Tobacco Counseling Counseling given: Not Answered Tobacco comments: smoked for 4-5 years from age 82- 1 years old   Clinical Intake:  Pre-visit preparation completed: Yes  Pain : No/denies pain     Diabetes: No  How often do you need to have someone help you when you read instructions, pamphlets, or other written materials from your doctor or pharmacy?: 1 - Never  Interpreter Needed?: No  Information entered by :: Kandis Fantasia LPN   Activities of Daily Living    12/02/2023    1:48 PM  In your present state of health, do you have any difficulty performing the following activities:  Hearing? 0  Vision? 0  Difficulty concentrating or making  decisions? 0  Walking or climbing stairs? 0  Dressing or bathing? 0  Doing errands, shopping? 0  Preparing Food and eating ? N  Using the Toilet? N  In the past six months, have you accidently leaked urine? N  Do you have problems with loss of bowel control? N  Managing your Medications? N  Managing your Finances? N  Housekeeping or managing your Housekeeping? N    Patient Care Team: Junie Spencer, FNP as PCP - General (Family Medicine) Jodelle Red, MD as PCP - Cardiology (Cardiology) Ernesto Rutherford, MD as Consulting Physician (Ophthalmology) Adam Phenix, DPM as Consulting Physician (Podiatry) Freddy Finner, MD (Inactive) as Consulting Physician (Obstetrics and Gynecology) Danella Maiers, [facility] as Pharmacist (Family Medicine)  Indicate any recent Medical Services you may have received from other than [facility] providers in the past year (date may be approximate).     Assessment:   This is a routine wellness examination for Reagen.  Hearing/Vision screen Hearing Screening - Comments:: Denies hearing difficulties   Vision Screening - Comments:: Wears rx glasses - up to date with routine eye exams with [facility]     Goals Addressed   None   Depression Screen    12/02/2023    1:20 PM 11/19/2023    2:00 PM 07/30/2023   12:36 PM 03/22/2023   10:36 AM 12/28/2022    2:02 PM 11/26/2022    2:34 PM 06/28/2022    2:40 PM  PHQ 2/9 Scores  PHQ - 2 Score 0 0 0 0 0 0 0  PHQ- 9 Score   2 3 0      Fall Risk    12/02/2023    1:47 PM 11/19/2023    2:00 PM 03/22/2023   10:29 AM 12/28/2022    2:01 PM 06/28/2022    2:40 PM  Fall Risk   Falls in the past year? 0 0 0 0 0  Number  falls in past yr: 0  0    Injury with Fall? 0  0    Risk for fall due to : No Fall Risks  No Fall Risks    Follow up Falls prevention discussed;Education provided;Falls evaluation completed  Education provided      MEDICARE RISK AT HOME: Medicare Risk at Home Any stairs in or around the  home?: No If so, are there any without handrails?: No Home free of loose throw rugs in walkways, pet beds, electrical cords, etc?: Yes Adequate lighting in your home to reduce risk of falls?: Yes Life alert?: No Use of a cane, walker or w/c?: No Grab bars in the bathroom?: Yes Shower chair or bench in shower?: No Elevated toilet seat or a handicapped toilet?: Yes  TIMED UP AND GO:  Was the test performed?  No    Cognitive Function:    08/14/2018    2:53 PM 05/21/2016   11:34 AM  MMSE - Mini Mental State Exam  Orientation to time 5 5  Orientation to Place 5 5  Registration 3 3  Attention/ Calculation 5 5  Recall 3 2  Language- name 2 objects 2 2  Language- repeat 1 1  Language- follow 3 step command 3 3  Language- read & follow direction 1 1  Write a sentence 1 1  Copy design 1 1  Total score 30 29        12/02/2023    1:53 PM 11/26/2022    2:36 PM 08/19/2019   10:25 AM  6CIT Screen  What Year? 0 points 0 points 0 points  What month? 0 points 0 points 0 points  What time? 0 points 0 points 0 points  Count back from 20 0 points 0 points 0 points  Months in reverse 0 points 0 points 0 points  Repeat phrase 0 points 0 points 0 points  Total Score 0 points 0 points 0 points    Immunizations Immunization History  Administered Date(s) Administered   Fluad Quad(high Dose 65+) 10/01/2019, 10/12/2020, 11/14/2021, 11/16/2022   Influenza, High Dose Seasonal PF 10/01/2016, 09/04/2017, 10/23/2018   Influenza,inj,Quad PF,6+ Mos 10/06/2013, 09/22/2014   Influenza-Unspecified 10/10/2015   Pneumococcal Conjugate-13 10/06/2013   Pneumococcal Polysaccharide-23 02/16/2016   Unspecified SARS-COV-2 Vaccination 02/08/2020, 03/10/2020   Zoster Recombinant(Shingrix) 07/13/2021, 12/28/2022   Zoster, Live 04/21/2014    TDAP status: Due, Education has been provided regarding the importance of this vaccine. Advised may receive this vaccine at local pharmacy or Health Dept. Aware to  provide a copy of the vaccination record if obtained from local pharmacy or Health Dept. Verbalized acceptance and understanding.  Flu Vaccine status: Due, Education has been provided regarding the importance of this vaccine. Advised may receive this vaccine at local pharmacy or Health Dept. Aware to provide a copy of the vaccination record if obtained from local pharmacy or Health Dept. Verbalized acceptance and understanding.  Pneumococcal vaccine status: Up to date  Covid-19 vaccine status: Information provided on how to obtain vaccines.   Qualifies for Shingles Vaccine? Yes   Zostavax completed Yes   Shingrix Completed?: Yes  Screening Tests Health Maintenance  Topic Date Due   DTaP/Tdap/Td (1 - Tdap) Never done   FOOT EXAM  07/13/2022   COVID-19 Vaccine (3 - 2024-25 season) 08/11/2023   INFLUENZA VACCINE  03/09/2024 (Originally 07/11/2023)   HEMOGLOBIN A1C  01/30/2024   Diabetic kidney evaluation - eGFR measurement  07/29/2024   Diabetic kidney evaluation - Urine ACR  07/29/2024  OPHTHALMOLOGY EXAM  10/20/2024   Medicare Annual Wellness (AWV)  12/01/2024   DEXA SCAN  09/05/2025   Colonoscopy  07/21/2026   Pneumonia Vaccine 34+ Years old  Completed   Hepatitis C Screening  Completed   Zoster Vaccines- Shingrix  Completed   HPV VACCINES  Aged Out    Health Maintenance  Health Maintenance Due  Topic Date Due   DTaP/Tdap/Td (1 - Tdap) Never done   FOOT EXAM  07/13/2022   COVID-19 Vaccine (3 - 2024-25 season) 08/11/2023    Colorectal cancer screening: Type of screening: Colonoscopy. Completed 07/21/21. Repeat every 5 years  Mammogram status: Completed 03/13/23. Repeat every year  Bone Density status: Completed 09/06/23. Results reflect: Bone density results: OSTEOPENIA. Repeat every 2 years.  Lung Cancer Screening: (Low Dose CT Chest recommended if Age 71-80 years, 20 pack-year currently smoking OR have quit w/in 15years.) does not qualify.   Lung Cancer Screening  Referral: n/a  Additional Screening:  Hepatitis C Screening: does qualify; Completed 02/16/16  Vision Screening: Recommended annual ophthalmology exams for early detection of glaucoma and other disorders of the eye. Is the patient up to date with their annual eye exam?  Yes  Who is the provider or what is the name of the office in which the patient attends annual eye exams? [facility]  If pt is not established with a provider, would they like to be referred to a provider to establish care? No .   Dental Screening: Recommended annual dental exams for proper oral hygiene  Diabetic Foot Exam: Diabetic Foot Exam: Overdue, Pt has been advised about the importance in completing this exam. Pt is scheduled for diabetic foot exam on at next office visit.  Community Resource Referral / Chronic Care Management: CRR required this visit?  No   CCM required this visit?  No     Plan:     I have personally reviewed and noted the following in the patient's chart:   Medical and social history Use of alcohol, tobacco or illicit drugs  Current medications and supplements including opioid prescriptions. Patient is not currently taking opioid prescriptions. Functional ability and status Nutritional status Physical activity Advanced directives List of other physicians Hospitalizations, surgeries, and ER visits in previous 12 months Vitals Screenings to include cognitive, depression, and falls Referrals and appointments  In addition, I have reviewed and discussed with patient certain preventive protocols, quality metrics, and best practice recommendations. A written personalized care plan for preventive services as well as general preventive health recommendations were provided to patient.     Kandis Fantasia [place], [place]   [id number]   After Visit Summary: (MyChart) Due to this being a telephonic visit, the after visit summary with patients personalized plan was offered to patient via  MyChart   Nurse Notes: No concerns at this time

## 2023-12-10 ENCOUNTER — Telehealth: Payer: Self-pay

## 2023-12-10 ENCOUNTER — Ambulatory Visit: Payer: Self-pay | Admitting: Family

## 2023-12-10 NOTE — Telephone Encounter (Signed)
  Chief Complaint: urinary frequency Symptoms: burning, frequency Frequency: every 30- 60 mins  Disposition: [] ED /[x] Urgent Care (no appt availability in office) / [] Appointment(In office/virtual)/ []  [facility]/ [] Home Care/ [] Refused Recommended Disposition /[]  Mobile Bus/ []  Follow-up with PCP Additional Notes: Pt called office this morning wanting another abx called in for what pt thinks is a rebounding UTI. No one called to update pt she needed to go to urgent care due to office being closed early for New Year Eve. Pt had UTI 2-3 weeks ago and was better but frequency and burning returned last night. RN tried to make urgent care appt, but pt was irritated and got off phone before care advice could be given or  appts made for in office or urgent care.          Copied from CRM [id number]. Topic: Clinical - Pink Word Triage >> Dec 10, 2023  1:22 PM Carmell R wrote: Reason for CRM: Patient was not informed the office was closing early today so she was waiting to hear back since 8am ([id number]). Her UTI symptoms are worse than the last time and she is not sure what to do. Was wanting to get something prescribed. The medication that was prescribed was amoxicillin  (AMOXIL ) 500 MG capsule and it was working fine. Cb# [id number] Reason for Disposition  Urinating more frequently than usual (i.e., frequency)  Answer Assessment - Initial Assessment Questions 1. SYMPTOM: What's the main symptom you're concerned about? (e.g., frequency, incontinence)     Frequent- every 30 mins to hour 2. ONSET: When did the    start?     12/30 3. PAIN: Is there any pain? If Yes, ask: How bad is it? (Scale: 1-10; mild, moderate, severe)     4 4. CAUSE: What do you think is causing the symptoms?     Recurrent UTI 5. OTHER SYMPTOMS: Do you have any other symptoms? (e.g., blood in urine, fever, flank pain, pain with urination)     Burning after urination  Protocols used: Urinary  Symptoms-A-AH

## 2023-12-10 NOTE — Telephone Encounter (Signed)
 Copied from CRM [id number]. Topic: Clinical - Medication Question >> Dec 10, 2023  8:15 AM Karel PARAS wrote: Reason for CRM: pt has an uti. pt is stating that she had a uti 3 weeks ago and was prescribed something then. Pt would like to know if provider can prescribe her something for the uti.

## 2023-12-12 DIAGNOSIS — N390 Urinary tract infection, site not specified: Secondary | ICD-10-CM | POA: Diagnosis not present

## 2023-12-12 DIAGNOSIS — N3000 Acute cystitis without hematuria: Secondary | ICD-10-CM | POA: Diagnosis not present

## 2023-12-17 ENCOUNTER — Ambulatory Visit: Payer: Medicare Other

## 2024-01-09 ENCOUNTER — Encounter (HOSPITAL_BASED_OUTPATIENT_CLINIC_OR_DEPARTMENT_OTHER): Payer: Self-pay | Admitting: Pulmonary Disease

## 2024-01-09 ENCOUNTER — Ambulatory Visit (HOSPITAL_BASED_OUTPATIENT_CLINIC_OR_DEPARTMENT_OTHER): Payer: Medicare Other | Admitting: Pulmonary Disease

## 2024-01-09 VITALS — BP 120/58 | HR 78 | Ht 59.0 in | Wt 189.5 lb

## 2024-01-09 DIAGNOSIS — I429 Cardiomyopathy, unspecified: Secondary | ICD-10-CM

## 2024-01-09 DIAGNOSIS — R0602 Shortness of breath: Secondary | ICD-10-CM | POA: Diagnosis not present

## 2024-01-09 DIAGNOSIS — G4733 Obstructive sleep apnea (adult) (pediatric): Secondary | ICD-10-CM | POA: Diagnosis not present

## 2024-01-09 NOTE — Addendum Note (Signed)
Addended byClyda Greener M on: 01/09/2024 05:04 PM   Modules accepted: Orders

## 2024-01-09 NOTE — Patient Instructions (Signed)
CPAP is working well on current settings. CPAP supplies will be renewed for a year -needs set up with new DME per insurance

## 2024-01-09 NOTE — Progress Notes (Signed)
Subjective:    Patient ID: Robin Lindsey, female    DOB: [birth year], 77 y.o.   MRN: [id number]  HPI  78 yo remote smoker from [place], for FU of OSA &  shortness of breath, ongoing for 5 years, progressive.  She quit smoking 50 years ago, smoked less than 10 pack years  Etiology was not clear on initial OV 01/24/23.  She is a remote smoker less than 10 pack years-smoking history is not significant.  There is no obvious ILD, no evidence of pulmonary hypertension on previous echo from 2020. Echo showed a new drop in EF to 42%.     PMH : nonischemic cardiomyopathy diagnosed 2024, hypertension, diabetes     Discussed the use of AI scribe software for clinical note transcription with the patient, who gave verbal consent to proceed.  History of Present Illness   The patient, with a history of obstructive sleep apnea and non-ischemic cardiomyopathy, presents with shortness of breath. The patient's shortness of breath was previously attributed to non-ischemic cardiomyopathy with an ejection fraction of 40%. She has been started on losartan and metoprolol by cardiology and has minimal coronary artery disease on CT scan. The patient reports improvement in shortness of breath after losing weight. However, she experienced severe side effects, including diarrhea, vomiting, and nausea, from Ozempic, which was prescribed for weight loss. The patient has been using a CPAP machine for her sleep apnea, which she finds uncomfortable but admits has improved her symptoms. She has expressed interest in the [facility] implant as an alternative to CPAP. The patient also reports a recent improvement in her shortness of breath, which she attributes to weight loss and increased physical activity.      CPAP download was reviewed which shows excellent control of events on auto settings 10 to 15 cm with average pressure of 14 and maximum pressure of 14.7 cm.  She is very compliant without a single missed night.  There is a  mild leak.  CPAP certainly helped improve her daytime somnolence and fatigue  Significant tests/ events reviewed   PFT 06/2023 >> Mild restriction related to weight.   HST 02/2023 >> wt 208 lbs -AHI 54/hour, lowest desaturation 74%   Echo 03/2023 EF dropped to 42% 05/2019 echocardiogram -normal LVEF, impaired relaxation, mitral valve thickening and calcification without stenosis   HRCT chest 01/2023 >> Mild patchy air trapping in both lungs, No ILD, Mild patchy tree-in-bud type tiny pulmonary nodules at the periphery of the right lower lobe, Scattered mild cylindrical bronchiolectasis in the right middle lobe and left upper lobe 05/2019 CT coronaries -right middle lobe calcified granuloma  Review of Systems neg for any significant sore throat, dysphagia, itching, sneezing, nasal congestion or excess/ purulent secretions, fever, chills, sweats, unintended wt loss, pleuritic or exertional cp, hempoptysis, orthopnea pnd or change in chronic leg swelling. Also denies presyncope, palpitations, heartburn, abdominal pain, nausea, vomiting, diarrhea or change in bowel or urinary habits, dysuria,hematuria, rash, arthralgias, visual complaints, headache, numbness weakness or ataxia.     Objective:   Physical Exam  Gen. Pleasant, obese, in no distress ENT - no lesions, no post nasal drip Neck: No JVD, no thyromegaly, no carotid bruits Lungs: no use of accessory muscles, no dullness to percussion, decreased without rales or rhonchi  Cardiovascular: Rhythm regular, heart sounds  normal, no murmurs or gallops, no peripheral edema Musculoskeletal: No deformities, no cyanosis or clubbing , no tremors        Assessment & Plan:   Assessment  and Plan    Obstructive Sleep Apnea Managed with CPAP therapy, resulting in significant symptom improvement. Using CPAP for approximately seven hours nightly with minimal leak. Advised against Inspire implant due to excellent CPAP results (events reduced to 1.9/hour)  and potential risks of the implant. - Continue CPAP therapy - Investigate supply issues and set up with a new supplier for CPAP supplies  Non-Ischemic Cardiomyopathy Ejection fraction of 40%. On losartan and metoprolol with improved shortness of breath, attributed to weight loss and medication. Minimal coronary artery disease on CT scan; recent catheterization showed no blockages. Cardiologist indicated low likelihood of mortality from heart issues. - Continue losartan and metoprolol - Monitor symptoms and follow up with cardiology as needed  Weight Management Weight loss has positively impacted shortness of breath. Discontinued Ozempic due to severe gastrointestinal side effects (diarrhea, vomiting, nausea). Recent weight regain noted. - Encourage weight management through diet and exercise - Avoid Ozempic due to adverse effects  Follow-up - Schedule follow-up appointment in one year - Call if any issues arise before the next scheduled visit.

## 2024-01-30 ENCOUNTER — Ambulatory Visit: Payer: Medicare Other | Admitting: Family

## 2024-01-30 ENCOUNTER — Other Ambulatory Visit: Payer: Medicare Other

## 2024-02-03 ENCOUNTER — Ambulatory Visit (INDEPENDENT_AMBULATORY_CARE_PROVIDER_SITE_OTHER): Payer: Medicare Other | Admitting: Family

## 2024-02-03 ENCOUNTER — Encounter: Payer: Self-pay | Admitting: Family

## 2024-02-03 VITALS — BP 153/73 | HR 67 | Temp 97.5°F | Ht 59.0 in | Wt 193.8 lb

## 2024-02-03 DIAGNOSIS — K219 Gastro-esophageal reflux disease without esophagitis: Secondary | ICD-10-CM | POA: Diagnosis not present

## 2024-02-03 DIAGNOSIS — E1159 Type 2 diabetes mellitus with other circulatory complications: Secondary | ICD-10-CM | POA: Diagnosis not present

## 2024-02-03 DIAGNOSIS — E1169 Type 2 diabetes mellitus with other specified complication: Secondary | ICD-10-CM | POA: Diagnosis not present

## 2024-02-03 DIAGNOSIS — T466X5A Adverse effect of antihyperlipidemic and antiarteriosclerotic drugs, initial encounter: Secondary | ICD-10-CM | POA: Diagnosis not present

## 2024-02-03 DIAGNOSIS — E1149 Type 2 diabetes mellitus with other diabetic neurological complication: Secondary | ICD-10-CM | POA: Diagnosis not present

## 2024-02-03 DIAGNOSIS — M8580 Other specified disorders of bone density and structure, unspecified site: Secondary | ICD-10-CM

## 2024-02-03 DIAGNOSIS — G4733 Obstructive sleep apnea (adult) (pediatric): Secondary | ICD-10-CM | POA: Diagnosis not present

## 2024-02-03 DIAGNOSIS — F411 Generalized anxiety disorder: Secondary | ICD-10-CM | POA: Diagnosis not present

## 2024-02-03 DIAGNOSIS — E559 Vitamin D deficiency, unspecified: Secondary | ICD-10-CM | POA: Diagnosis not present

## 2024-02-03 DIAGNOSIS — I152 Hypertension secondary to endocrine disorders: Secondary | ICD-10-CM | POA: Diagnosis not present

## 2024-02-03 DIAGNOSIS — M791 Myalgia, unspecified site: Secondary | ICD-10-CM

## 2024-02-03 LAB — BAYER DCA HB A1C WAIVED: HB A1C (BAYER DCA - WAIVED): 5.8 % — ABNORMAL HIGH (ref 4.8–5.6)

## 2024-02-03 MED ORDER — OMEPRAZOLE 20 MG PO CPDR: DELAYED_RELEASE_CAPSULE | ORAL | 3 refills | Status: AC

## 2024-02-03 MED ORDER — VALSARTAN-HYDROCHLOROTHIAZIDE 160-25 MG PO TABS: ORAL_TABLET | ORAL | 3 refills | Status: AC

## 2024-02-03 NOTE — Progress Notes (Signed)
 Subjective:    Patient ID: Robin Lindsey, female    DOB: 08/03/1946, 78 y.o.   MRN: [id number]  Chief Complaint  Patient presents with   Medical Management of Chronic Issues    Pt would like to have her B6 and B12 levels checked.  Would like to know if she can take collagen made from chicken?   Pt presents to the office today for chronic follow up.     She is followed by Pulmonologist's and diagnosed with OSA. Using CPAP and doing well. Continues to have intermittent SOB. Was encouraged to lose weight.    She saw Cardiologists annually.     She is morbid obese with a 39 with DM and HTN.     She has osteoporosis and takes Prolia every 6 months. This has decreased to Osteopenia.   Diabetes She presents for her follow-up diabetic visit. She has type 2 diabetes mellitus. Hypoglycemia symptoms include nervousness/anxiousness. Associated symptoms include foot paresthesias. Pertinent negatives for diabetes include no blurred vision. Diabetic complications include peripheral neuropathy. Risk factors for coronary artery disease include diabetes mellitus, dyslipidemia, hypertension, sedentary lifestyle and post-menopausal. She is following a generally unhealthy diet. (Does not check glucose regularly )  Hypertension This is a chronic problem. The current episode started more than 1 year ago. The problem has been waxing and waning since onset. The problem is uncontrolled. Associated symptoms include anxiety and malaise/fatigue. Pertinent negatives include no blurred vision, peripheral edema or shortness of breath. Risk factors for coronary artery disease include dyslipidemia, obesity, diabetes mellitus and sedentary lifestyle. The current treatment provides moderate improvement.  Gastroesophageal Reflux She complains of belching and heartburn. This is a chronic problem. The current episode started more than 1 year ago. The problem occurs occasionally. The symptoms are aggravated by medications. Risk  factors include obesity. She has tried a PPI for the symptoms. The treatment provided moderate relief.  Anxiety Presents for follow-up visit. Symptoms include excessive worry and nervous/anxious behavior. Patient reports no shortness of breath. Symptoms occur occasionally. The severity of symptoms is moderate.        Review of Systems  Constitutional:  Positive for malaise/fatigue.  Eyes:  Negative for blurred vision.  Respiratory:  Negative for shortness of breath.   Gastrointestinal:  Positive for heartburn.  Psychiatric/Behavioral:  The patient is nervous/anxious.   All other systems reviewed and are negative.      Objective:   Physical Exam Vitals reviewed.  Constitutional:      General: She is not in acute distress.    Appearance: She is well-developed. She is obese.  HENT:     Head: Normocephalic and atraumatic.     Right Ear: Tympanic membrane normal.     Left Ear: Tympanic membrane normal.  Eyes:     Pupils: Pupils are equal, round, and reactive to light.  Neck:     Thyroid: No thyromegaly.  Cardiovascular:     Rate and Rhythm: Normal rate and regular rhythm.     Heart sounds: Normal heart sounds. No murmur heard. Pulmonary:     Effort: Pulmonary effort is normal. No respiratory distress.     Breath sounds: Normal breath sounds. No wheezing.  Abdominal:     General: Bowel sounds are normal. There is no distension.     Palpations: Abdomen is soft.     Tenderness: There is no abdominal tenderness.  Musculoskeletal:        General: No tenderness. Normal range of motion.  Cervical back: Normal range of motion and neck supple.  Skin:    General: Skin is warm and dry.  Neurological:     Mental Status: She is alert and oriented to person, place, and time.     Cranial Nerves: No cranial nerve deficit.     Deep Tendon Reflexes: Reflexes are normal and symmetric.  Psychiatric:        Behavior: Behavior normal.        Thought Content: Thought content normal.         Judgment: Judgment normal.     Diabetic Foot Exam - Simple   Simple Foot Form Diabetic Foot exam was performed with the following findings: Yes 02/03/2024  2:23 PM  Visual Inspection No deformities, no ulcerations, no other skin breakdown bilaterally: Yes Sensation Testing Intact to touch and monofilament testing bilaterally: Yes Pulse Check Posterior Tibialis and Dorsalis pulse intact bilaterally: Yes Comments      BP (!) 153/73   Pulse 67   Temp (!) 97.5 F (36.4 C)   Ht 4\' 11"  (1.499 m)   Wt 193 lb 12.8 oz (87.9 kg)   SpO2 99%   BMI 39.14 kg/m      Assessment & Plan:  Robin Lindsey comes in today with chief complaint of Medical Management of Chronic Issues (Pt would like to have her B6 and B12 levels checked. Robin Lindsey like to know if she can take collagen made from chicken?)   Diagnosis and orders addressed:  1. Type 2 diabetes mellitus with other specified complication, without long-term current use of insulin (HCC) (Primary) - Microalbumin/Creatinine Ratio, Urine - Bayer DCA Hb A1c Waived - CMP14+EGFR  2. GAD (generalized anxiety disorder) - CMP14+EGFR  3. Gastroesophageal reflux disease, unspecified whether esophagitis present - CMP14+EGFR  4. Hypertension associated with diabetes (HCC) - CMP14+EGFR - valsartan-hydrochlorothiazide (DIOVAN-HCT) 160-25 MG tablet; TAKE ONE (1) TABLET EACH DAY  Dispense: 90 tablet; Refill: 3  5. Morbid obesity (HCC) - CMP14+EGFR  6. Myalgia due to statin - CMP14+EGFR  7. OSA (obstructive sleep apnea) - CMP14+EGFR  8. Osteopenia, unspecified location - CMP14+EGFR  9. Vitamin D deficiency - CMP14+EGFR  10. Other diabetic neurological complication associated with type 2 diabetes mellitus (HCC) - CMP14+EGFR - Vitamin B12  11. Gastroesophageal reflux disease without esophagitis - omeprazole (PRILOSEC) 20 MG capsule; TAKE ONE (1) CAPSULE BY MOUTH 2 TIMES DAILY  Dispense: 180 capsule; Refill: 3 : 1  Continue  current medications  Low carb diet  Encourage healthy diet and exercise  Follow up in 3 months    Robin Rodney, FNP

## 2024-02-03 NOTE — Patient Instructions (Signed)
 Neuropathic Pain Neuropathic pain is pain caused by damage to the nerves that are responsible for certain sensations in your body (sensory nerves). Neuropathic pain can make you more sensitive to pain. Even a minor sensation can feel very painful. This is usually a long-term (chronic) condition that can be difficult to treat. The type of pain differs from person to person. It may: Start suddenly (acute), or it may develop slowly and become chronic. Come and go as damaged nerves heal, or it may stay at the same level for years. Cause emotional distress, loss of sleep, and a lower quality of life. What are the causes? The most common cause of this condition is diabetes. Many other diseases and conditions can also cause neuropathic pain. Causes of neuropathic pain can be classified as: Toxic. This is caused by medicines and chemicals. The most common causes of toxic neuropathic pain is damage from medicines that kill cancer cells (chemotherapy) or alcohol abuse. Metabolic. This can be caused by: Diabetes. Lack of vitamins like B12. Traumatic. Any injury that cuts, crushes, or stretches a nerve can cause damage and pain. Compression-related. If a sensory nerve gets trapped or compressed for a long period of time, the blood supply to the nerve can be cut off. Vascular. Many blood vessel diseases can cause neuropathic pain by decreasing blood supply and oxygen to nerves. Autoimmune. This type of pain results from diseases in which the body's defense system (immune system) mistakenly attacks sensory nerves. Examples of autoimmune diseases that can cause neuropathic pain include lupus and multiple sclerosis. Infectious. Many types of viral infections can damage sensory nerves and cause pain. Shingles infection is a common cause of this type of pain. Inherited. Neuropathic pain can be a symptom of many diseases that are passed down through families (genetic). What increases the risk? You are more likely to  develop this condition if: You have diabetes. You smoke. You drink too much alcohol. You are taking certain medicines, including chemotherapy or medicines that treat immune system disorders. What are the signs or symptoms? The main symptom is pain. Neuropathic pain is often described as: Burning. Shock-like. Stinging. Hot or cold. Itching. How is this diagnosed? No single test can diagnose neuropathic pain. It is diagnosed based on: A physical exam and your symptoms. Your health care provider will ask you about your pain. You may be asked to use a pain scale to describe how bad your pain is. Tests. These may be done to see if you have a cause and location of any nerve damage. They include: Nerve conduction studies and electromyography to test how well nerve signals travel through your nerves and muscles (electrodiagnostic testing). Skin biopsy to evaluate for small fiber neuropathy. Imaging studies, such as: X-rays. CT scan. MRI. How is this treated? Treatment for neuropathic pain may change over time. You may need to try different treatment options or a combination of treatments. Some options include: Treating the underlying cause of the neuropathy, such as diabetes, kidney disease, or vitamin deficiencies. Stopping medicines that can cause neuropathy, such as chemotherapy. Medicine to relieve pain. Medicines may include: Prescription or over-the-counter pain medicine. Anti-seizure medicine. Antidepressant medicines. Pain-relieving patches or creams that are applied to painful areas of skin. A medicine to numb the area (local anesthetic), which can be injected as a nerve block. Transcutaneous nerve stimulation. This uses electrical currents to block painful nerve signals. The treatment is painless. Alternative treatments, such as: Acupuncture. Meditation. Massage. Occupational or physical therapy. Pain management programs. Counseling. Follow  these instructions at  home: Medicines  Take over-the-counter and prescription medicines only as told by your health care provider. Ask your health care provider if the medicine prescribed to you: Requires you to avoid driving or using machinery. Can cause constipation. You may need to take these actions to prevent or treat constipation: Drink enough fluid to keep your urine pale yellow. Take over-the-counter or prescription medicines. Eat foods that are high in fiber, such as beans, whole grains, and fresh fruits and vegetables. Limit foods that are high in fat and processed sugars, such as fried or sweet foods. Lifestyle  Have a good support system at home. Consider joining a chronic pain support group. Do not use any products that contain nicotine or tobacco. These products include cigarettes, chewing tobacco, and vaping devices, such as e-cigarettes. If you need help quitting, ask your health care provider. Do not drink alcohol. General instructions Learn as much as you can about your condition. Work closely with all your health care providers to find the treatment plan that works best for you. Ask your health care provider what activities are safe for you. Keep all follow-up visits. This is important. Contact a health care provider if: Your pain treatments are not working. You are having side effects from your medicines. You are struggling with tiredness (fatigue), mood changes, depression, or anxiety. Get help right away if: You have thoughts of hurting yourself. Get help right away if you feel like you may hurt yourself or others, or have thoughts about taking your own life. Go to your nearest emergency room or: Call 911. Call the National Suicide Prevention Lifeline at 947-472-5826 or 988. This is open 24 hours a day. Text the Crisis Text Line at 657-070-3362. Summary Neuropathic pain is pain caused by damage to the nerves that are responsible for certain sensations in your body (sensory  nerves). Neuropathic pain may come and go as damaged nerves heal, or it may stay at the same level for years. Neuropathic pain is usually a long-term condition that can be difficult to treat. Consider joining a chronic pain support group. This information is not intended to replace advice given to you by your health care provider. Make sure you discuss any questions you have with your health care provider. Document Revised: 07/24/2021 Document Reviewed: 07/24/2021 Elsevier Patient Education  2024 [employer].

## 2024-02-04 LAB — CMP14+EGFR
ALT: 17 [IU]/L (ref 0–32)
AST: 18 [IU]/L (ref 0–40)
Albumin: 4.4 g/dL (ref 3.8–4.8)
Alkaline Phosphatase: 60 [IU]/L (ref 44–121)
BUN/Creatinine Ratio: 20 (ref 12–28)
BUN: 20 mg/dL (ref 8–27)
Bilirubin Total: 0.4 mg/dL (ref 0.0–1.2)
CO2: 26 mmol/L (ref 20–29)
Calcium: 9.5 mg/dL (ref 8.7–10.3)
Chloride: 99 mmol/L (ref 96–106)
Creatinine, Ser: 1 mg/dL (ref 0.57–1.00)
Globulin, Total: 1.9 g/dL (ref 1.5–4.5)
Glucose: 96 mg/dL (ref 70–99)
Potassium: 4.3 mmol/L (ref 3.5–5.2)
Sodium: 139 mmol/L (ref 134–144)
Total Protein: 6.3 g/dL (ref 6.0–8.5)
eGFR: 57 mL/min/{1.73_m2} — ABNORMAL LOW (ref >59)

## 2024-02-04 LAB — VITAMIN B12: Vitamin B-12: 227 pg/mL — ABNORMAL LOW (ref 232–1245)

## 2024-02-04 LAB — MICROALBUMIN / CREATININE URINE RATIO
Creatinine, Urine: 24.4 mg/dL
Microalb/Creat Ratio: <12 mg/g{creat} (ref 0–29)
Microalbumin, Urine: <3 ug/mL

## 2024-02-06 ENCOUNTER — Other Ambulatory Visit: Payer: Self-pay | Admitting: Family

## 2024-02-06 DIAGNOSIS — E1169 Type 2 diabetes mellitus with other specified complication: Secondary | ICD-10-CM

## 2024-02-06 MED ORDER — SEMAGLUTIDE(0.25 OR 0.5MG/DOS) 2 MG/3ML ~~LOC~~ SOPN: 0.2500 mg | PEN_INJECTOR | SUBCUTANEOUS | 2 refills | Status: DC

## 2024-02-10 ENCOUNTER — Ambulatory Visit (INDEPENDENT_AMBULATORY_CARE_PROVIDER_SITE_OTHER): Payer: Medicare Other

## 2024-02-10 DIAGNOSIS — E538 Deficiency of other specified B group vitamins: Secondary | ICD-10-CM

## 2024-02-10 MED ORDER — CYANOCOBALAMIN 1000 MCG/ML IJ SOLN
1000.0000 ug | Freq: Once | INTRAMUSCULAR | Status: AC
  Administered 2024-02-10 – 2024-02-11 (×2): 1000 ug via INTRAMUSCULAR

## 2024-02-10 NOTE — Progress Notes (Signed)
 Patient is in office today for a nurse visit for B12 Injection. Patient Injection was given in the  Right deltoid. Patient tolerated injection well.

## 2024-02-11 ENCOUNTER — Ambulatory Visit (INDEPENDENT_AMBULATORY_CARE_PROVIDER_SITE_OTHER): Payer: Medicare Other

## 2024-02-11 DIAGNOSIS — E538 Deficiency of other specified B group vitamins: Secondary | ICD-10-CM | POA: Diagnosis not present

## 2024-02-11 NOTE — Progress Notes (Signed)
 Patient is in office today for a nurse visit for B12 Injection. Patient Injection was given in the  Left deltoid. Patient tolerated injection well.

## 2024-02-12 ENCOUNTER — Ambulatory Visit (INDEPENDENT_AMBULATORY_CARE_PROVIDER_SITE_OTHER): Payer: Medicare Other

## 2024-02-12 DIAGNOSIS — E538 Deficiency of other specified B group vitamins: Secondary | ICD-10-CM

## 2024-02-12 MED ORDER — CYANOCOBALAMIN 1000 MCG/ML IJ SOLN
1000.0000 ug | Freq: Once | INTRAMUSCULAR | Status: AC
  Administered 2024-02-12: 1000 ug via INTRAMUSCULAR

## 2024-02-12 NOTE — Progress Notes (Signed)
 Patient is in office today for a nurse visit for B12 Injection. Patient Injection was given in the  Right deltoid. Patient tolerated injection well.

## 2024-02-13 ENCOUNTER — Ambulatory Visit: Payer: Medicare Other

## 2024-02-13 DIAGNOSIS — E538 Deficiency of other specified B group vitamins: Secondary | ICD-10-CM

## 2024-02-13 MED ORDER — CYANOCOBALAMIN 1000 MCG/ML IJ SOLN
1000.0000 ug | Freq: Once | INTRAMUSCULAR | Status: AC
Start: 2024-02-13 — End: 2024-02-13
  Administered 2024-02-13: 1000 ug via INTRAMUSCULAR

## 2024-02-13 NOTE — Progress Notes (Signed)
 Patient is in office today for a nurse visit for B12 Injection. Patient Injection was given in the  Left deltoid. Patient tolerated injection well.

## 2024-02-14 ENCOUNTER — Ambulatory Visit (INDEPENDENT_AMBULATORY_CARE_PROVIDER_SITE_OTHER): Payer: Medicare Other

## 2024-02-14 DIAGNOSIS — E538 Deficiency of other specified B group vitamins: Secondary | ICD-10-CM

## 2024-02-14 MED ORDER — CYANOCOBALAMIN 1000 MCG/ML IJ SOLN
1000.0000 ug | Freq: Once | INTRAMUSCULAR | Status: AC
Start: 2024-02-14 — End: 2024-02-14
  Administered 2024-02-14: 1000 ug via INTRAMUSCULAR

## 2024-02-14 NOTE — Progress Notes (Signed)
 Patient is in office today for a nurse visit for B12 Injection. Patient Injection was given in the  Right deltoid. Patient tolerated injection well.

## 2024-02-17 ENCOUNTER — Telehealth: Payer: Self-pay

## 2024-02-17 DIAGNOSIS — M810 Age-related osteoporosis without current pathological fracture: Secondary | ICD-10-CM

## 2024-02-17 MED ORDER — DENOSUMAB 60 MG/ML ~~LOC~~ SOSY
60.0000 mg | PREFILLED_SYRINGE | Freq: Once | SUBCUTANEOUS | Status: AC
  Administered 2024-04-03: 60 mg via SUBCUTANEOUS

## 2024-02-17 NOTE — Telephone Encounter (Signed)
 Prolia ordered for PA team to review verification benefits

## 2024-02-18 ENCOUNTER — Ambulatory Visit (INDEPENDENT_AMBULATORY_CARE_PROVIDER_SITE_OTHER): Payer: Medicare Other

## 2024-02-18 DIAGNOSIS — E538 Deficiency of other specified B group vitamins: Secondary | ICD-10-CM

## 2024-02-18 DIAGNOSIS — H401123 Primary open-angle glaucoma, left eye, severe stage: Secondary | ICD-10-CM | POA: Diagnosis not present

## 2024-02-18 DIAGNOSIS — Z961 Presence of intraocular lens: Secondary | ICD-10-CM | POA: Diagnosis not present

## 2024-02-18 DIAGNOSIS — H401111 Primary open-angle glaucoma, right eye, mild stage: Secondary | ICD-10-CM | POA: Diagnosis not present

## 2024-02-18 MED ORDER — CYANOCOBALAMIN 1000 MCG/ML IJ SOLN
1000.0000 ug | Freq: Once | INTRAMUSCULAR | Status: AC
  Administered 2024-02-18: 1000 ug via INTRAMUSCULAR

## 2024-02-18 NOTE — Progress Notes (Signed)
 Patient is in office today for a nurse visit for B12 Injection. Patient Injection was given in the  Right deltoid. Patient tolerated injection well.

## 2024-02-25 ENCOUNTER — Ambulatory Visit (INDEPENDENT_AMBULATORY_CARE_PROVIDER_SITE_OTHER): Payer: Medicare Other | Admitting: Family Medicine

## 2024-02-25 DIAGNOSIS — E538 Deficiency of other specified B group vitamins: Secondary | ICD-10-CM

## 2024-02-25 MED ORDER — CYANOCOBALAMIN 1000 MCG/ML IJ SOLN
1000.0000 ug | Freq: Once | INTRAMUSCULAR | Status: AC
  Administered 2024-02-25: 1000 ug via INTRAMUSCULAR

## 2024-02-25 NOTE — Progress Notes (Signed)
 Patient given b12 in the left deltoid and tolerated well.

## 2024-03-03 ENCOUNTER — Ambulatory Visit (INDEPENDENT_AMBULATORY_CARE_PROVIDER_SITE_OTHER): Payer: Medicare Other | Admitting: *Deleted

## 2024-03-03 DIAGNOSIS — E538 Deficiency of other specified B group vitamins: Secondary | ICD-10-CM

## 2024-03-03 MED ORDER — CYANOCOBALAMIN 1000 MCG/ML IJ SOLN
1000.0000 ug | Freq: Once | INTRAMUSCULAR | Status: AC
Start: 2024-03-03 — End: 2024-03-03
  Administered 2024-03-03: 1000 ug via INTRAMUSCULAR

## 2024-03-03 NOTE — Progress Notes (Addendum)
 Patient is in office today for a nurse visit for B12 Injection. Patient Injection was given in the  Right upper quad. gluteus. Patient tolerated injection well.

## 2024-03-10 ENCOUNTER — Ambulatory Visit (INDEPENDENT_AMBULATORY_CARE_PROVIDER_SITE_OTHER)

## 2024-03-10 DIAGNOSIS — E538 Deficiency of other specified B group vitamins: Secondary | ICD-10-CM

## 2024-03-10 MED ORDER — CYANOCOBALAMIN 1000 MCG/ML IJ SOLN
1000.0000 ug | INTRAMUSCULAR | Status: AC
Start: 2024-03-10 — End: ?
  Administered 2024-03-10 – 2024-12-28 (×11): 1000 ug via INTRAMUSCULAR

## 2024-03-10 NOTE — Progress Notes (Signed)
 Patient is in office today for a nurse visit for B12 Injection. Patient Injection was given in the  Left deltoid. Patient tolerated injection well.

## 2024-03-11 ENCOUNTER — Telehealth: Payer: Self-pay

## 2024-03-11 NOTE — Telephone Encounter (Signed)
 Prolia VOB initiated via [email or web address]  Next Prolia inj DUE: 04/02/24

## 2024-03-13 ENCOUNTER — Other Ambulatory Visit (HOSPITAL_COMMUNITY): Payer: Self-pay

## 2024-03-13 NOTE — Telephone Encounter (Signed)
 Pt ready for scheduling for PROLIA on or after : 04/02/24  Option# 1: Buy/Bill (Office supplied medication)  Out-of-pocket cost due at time of clinic visit: $352  Number of injection/visits approved: 2  Primary: [employer] MEDICARE Prolia co-insurance: 20% Admin fee co-insurance: $20  Secondary: --- Prolia co-insurance:  Admin fee co-insurance:   Medical Benefit Details: Date Benefits were checked: 03/11/24 Deductible: NO/ Coinsurance: 20%/ Admin Fee: $20  Prior Auth: APPROVED PA# [id number] Expiration Date: 04/02/24-04/02/25  # of doses approved: 2 ----------------------------------------------------------------------- Option# 2- Med Obtained from pharmacy:  Pharmacy benefit: Copay $300 (Paid to pharmacy) Admin Fee: $20 (Pay at clinic)  Prior Auth: N/A PA# Expiration Date:   # of doses approved:   If patient wants fill through the pharmacy benefit please send prescription to: [facility], and include estimated need by date in rx notes. Pharmacy will ship medication directly to the office.  Patient NOT eligible for Prolia Copay Card. Copay Card can make patient's cost as little as $25. Link to apply: [email or web address]://[email or web address]  ** This summary of benefits is an estimation of the patient's out-of-pocket cost. Exact cost may very based on individual plan coverage.

## 2024-03-13 NOTE — Telephone Encounter (Signed)
 Marland Kitchen

## 2024-03-20 ENCOUNTER — Other Ambulatory Visit (HOSPITAL_BASED_OUTPATIENT_CLINIC_OR_DEPARTMENT_OTHER): Payer: Self-pay | Admitting: Cardiology

## 2024-03-20 DIAGNOSIS — I2510 Atherosclerotic heart disease of native coronary artery without angina pectoris: Secondary | ICD-10-CM

## 2024-03-20 NOTE — Telephone Encounter (Signed)
 Called patient regarding cost and to schedule appointment - LMTCB.

## 2024-04-03 ENCOUNTER — Ambulatory Visit

## 2024-04-03 DIAGNOSIS — M810 Age-related osteoporosis without current pathological fracture: Secondary | ICD-10-CM | POA: Diagnosis not present

## 2024-04-03 NOTE — Progress Notes (Signed)
 Patient is in office today for a nurse visit for  Prolia injection.   . Patient Injection was given in the  Right arm. Patient tolerated injection well.

## 2024-04-07 ENCOUNTER — Encounter: Payer: Self-pay | Admitting: Family Medicine

## 2024-04-07 ENCOUNTER — Ambulatory Visit (INDEPENDENT_AMBULATORY_CARE_PROVIDER_SITE_OTHER): Admitting: Family Medicine

## 2024-04-07 VITALS — BP 140/67 | Temp 97.8°F | Ht 59.0 in | Wt 187.0 lb

## 2024-04-07 DIAGNOSIS — Z7985 Long-term (current) use of injectable non-insulin antidiabetic drugs: Secondary | ICD-10-CM | POA: Diagnosis not present

## 2024-04-07 DIAGNOSIS — E1159 Type 2 diabetes mellitus with other circulatory complications: Secondary | ICD-10-CM

## 2024-04-07 DIAGNOSIS — R300 Dysuria: Secondary | ICD-10-CM

## 2024-04-07 DIAGNOSIS — I152 Hypertension secondary to endocrine disorders: Secondary | ICD-10-CM | POA: Diagnosis not present

## 2024-04-07 LAB — MICROSCOPIC EXAMINATION
Renal Epithel, UA: NONE SEEN /HPF
WBC, UA: >30 /HPF — AB (ref 0–5)

## 2024-04-07 LAB — URINALYSIS, ROUTINE W REFLEX MICROSCOPIC
Bilirubin, UA: NEGATIVE
Ketones, UA: NEGATIVE
Nitrite, UA: NEGATIVE
Protein,UA: NEGATIVE
Specific Gravity, UA: <1.005 — ABNORMAL LOW (ref 1.005–1.030)
Urobilinogen, Ur: 0.2 mg/dL (ref 0.2–1.0)
pH, UA: 5.5 (ref 5.0–7.5)

## 2024-04-07 MED ORDER — FLUCONAZOLE 150 MG PO TABS: 150.0000 mg | ORAL_TABLET | Freq: Once | ORAL | 0 refills | Status: AC

## 2024-04-07 MED ORDER — SULFAMETHOXAZOLE-TRIMETHOPRIM 800-160 MG PO TABS: 1.0000 | ORAL_TABLET | Freq: Two times a day (BID) | ORAL | 0 refills | Status: DC

## 2024-04-07 NOTE — Progress Notes (Signed)
 Subjective:  Patient ID: Robin Lindsey, female    DOB: [birth year], 78 y.o.   MRN: [id number]  Patient Care Team: Yevette Hem, FNP as PCP - General (Family Medicine) Sheryle Donning, MD as PCP - Cardiology (Cardiology) Maris Sickle, MD as Consulting Physician (Ophthalmology) Ashok Blake, DPM as Consulting Physician (Podiatry) Leontine Rana, MD (Inactive) as Consulting Physician (Obstetrics and Gynecology) Delilah Fend, [facility] as Pharmacist (Family Medicine)   Chief Complaint:  Dysuria   HPI: Robin Lindsey is a 78 y.o. female presenting on 04/07/2024 for Dysuria   Dysuria  The current episode started 1 to 4 weeks ago. The problem occurs every urination. The quality of the pain is described as burning. There is No history of pyelonephritis. Associated symptoms include chills, frequency and urgency. Pertinent negatives include no discharge, flank pain, hematuria, hesitancy, nausea, possible pregnancy, sweats or vomiting. Associated symptoms comments: Odor and itching as well. States that she has some leakage at baseline and so she wears pads. She changes them frequently. . She has tried nothing for the symptoms. Her past medical history is significant for recurrent UTIs. taking jardiance    Relevant past medical, surgical, family, and social history reviewed and updated as indicated.  Allergies and medications reviewed and updated. Data reviewed: Chart in Epic.   Past Medical History:  Diagnosis Date   Arthritis    Cataract    Diabetes mellitus without complication (HCC)    pt is not on diabetic medication   GERD (gastroesophageal reflux disease)    Glaucoma    worse in left eye than right eye   Heel pain    right heel pain   Hypertension    Osteoporosis    Personal history of colonic polyps 12/09/2012   Plantar fasciitis    right foot   Past Surgical History:  Procedure Laterality Date   BREAST LUMPECTOMY  10 + yrs.   left breast/benign   CATARACT  EXTRACTION W/PHACO  02/14/2012   Procedure: CATARACT EXTRACTION PHACO AND INTRAOCULAR LENS PLACEMENT (IOC);  Surgeon: Anner Kill, MD;  Location: [facility];  Service: Ophthalmology;  Laterality: Left;  CDE 13.73   CATARACT EXTRACTION W/PHACO  03/03/2012   Procedure: CATARACT EXTRACTION PHACO AND INTRAOCULAR LENS PLACEMENT (IOC);  Surgeon: Anner Kill, MD;  Location: [facility];  Service: Ophthalmology;  Laterality: Right;  CDE: 16.49   COLONOSCOPY     percutaneous micrfasciotomy with topaz coblation wand  12/11/2015   RIGHT/LEFT HEART CATH AND CORONARY ANGIOGRAPHY N/A 05/22/2023   Procedure: RIGHT/LEFT HEART CATH AND CORONARY ANGIOGRAPHY;  Surgeon: Arleen Lacer, MD;  Location: [facility] INVASIVE CV LAB;  Service: Cardiovascular;  Laterality: N/A;   rt foot surgery Right     Social History   Socioeconomic History   Marital status: Married    Spouse name: Louis   Number of children: 3   Years of education: 12   Highest education level: 12th grade  Occupational History   Occupation: retired  Tobacco Use   Smoking status: Former    Current packs/day: 0.00    Average packs/day: 1 pack/day for 5.0 years (5.0 ttl pk-yrs)    Types: Cigarettes    Start date: 03/10/1962    Quit date: 03/11/1967    Years since quitting: 57.1   Smokeless tobacco: Never   Tobacco comments:    smoked for 4-5 years from age 65- 1 years old  Vaping Use   Vaping status: Never Used  Substance and Sexual Activity  Alcohol use: No    Alcohol/week: 0.0 standard drinks of alcohol   Drug use: No   Sexual activity: Not Currently  Other Topics Concern   Not on file  Social History Narrative   Not on file   Social Drivers of Health   Financial Resource Strain: Low Risk  (12/02/2023)   Overall Financial Resource Strain (CARDIA)    Difficulty of Paying Living Expenses: Not hard at all  Food Insecurity: No Food Insecurity (12/02/2023)   Hunger Vital Sign    Worried About Running Out of Food in the Last Year: Never true     Ran Out of Food in the Last Year: Never true  Transportation Needs: No Transportation Needs (12/02/2023)   PRAPARE - Administrator, Civil Service (Medical): No    Lack of Transportation (Non-Medical): No  Physical Activity: Insufficiently Active (12/02/2023)   Exercise Vital Sign    Days of Exercise per Week: 3 days    Minutes of Exercise per Session: 30 min  Stress: No Stress Concern Present (12/02/2023)   [employer] of Occupational Health - Occupational Stress Questionnaire    Feeling of Stress : Not at all  Social Connections: Moderately Integrated (12/02/2023)   Social Connection and Isolation Panel [NHANES]    Frequency of Communication with Friends and Family: More than three times a week    Frequency of Social Gatherings with Friends and Family: Three times a week    Attends Religious Services: More than 4 times per year    Active Member of Clubs or Organizations: No    Attends Banker Meetings: Never    Marital Status: Married  Catering manager Violence: Not At Risk (12/02/2023)   Humiliation, Afraid, Rape, and Kick questionnaire    Fear of Current or Ex-Partner: No    Emotionally Abused: No    Physically Abused: No    Sexually Abused: No    Outpatient Encounter Medications as of 04/07/2024  Medication Sig   Coenzyme Q10 (CO Q-10) 50 MG CAPS Take 50 mg by mouth in the morning.   empagliflozin  (JARDIANCE ) 25 MG TABS tablet Take 1/2 tablet daily   LUMIGAN 0.01 % SOLN Place 1 drop into both eyes at bedtime.   omeprazole  (PRILOSEC) 20 MG capsule TAKE ONE (1) CAPSULE BY MOUTH 2 TIMES DAILY   pravastatin  (PRAVACHOL ) 10 MG tablet TAKE ONE (1) TABLET BY MOUTH EVERY DAY   Semaglutide ,0.25 or 0.5MG /DOS, 2 MG/3ML SOPN Inject 0.25 mg into the skin once a week.   valsartan -hydrochlorothiazide  (DIOVAN -HCT) 160-25 MG tablet TAKE ONE (1) TABLET EACH DAY   Facility-Administered Encounter Medications as of 04/07/2024  Medication   cyanocobalamin  (VITAMIN  B12) injection 1,000 mcg    Allergies  Allergen Reactions   Metformin  And Related Nausea And Vomiting   Aspirin  Nausea Only and Other (See Comments)    pain   Nsaids     Stomach pain    Review of Systems  Constitutional:  Positive for chills.  Gastrointestinal:  Negative for nausea and vomiting.  Genitourinary:  Positive for dysuria, frequency and urgency. Negative for flank pain, hematuria and hesitancy.   Objective:  BP (!) 140/67   Temp 97.8 F (36.6 C)   Ht 4\' 11"  (1.499 m)   Wt 187 lb (84.8 kg)   SpO2 99%   BMI 37.77 kg/m    Wt Readings from Last 3 Encounters:  04/07/24 187 lb (84.8 kg)  02/03/24 193 lb 12.8 oz (87.9 kg)  01/09/24 189 lb  8 oz (86 kg)   Physical Exam Constitutional:      General: She is awake. She is not in acute distress.    Appearance: Normal appearance. She is well-developed and well-groomed. She is obese. She is not ill-appearing, toxic-appearing or diaphoretic.  Cardiovascular:     Rate and Rhythm: Normal rate and regular rhythm.     Pulses: Normal pulses.          Radial pulses are 2+ on the right side and 2+ on the left side.       Posterior tibial pulses are 2+ on the right side and 2+ on the left side.     Heart sounds: Normal heart sounds. No murmur heard.    No gallop.  Pulmonary:     Effort: Pulmonary effort is normal. No respiratory distress.     Breath sounds: Normal breath sounds. No stridor. No wheezing, rhonchi or rales.  Abdominal:     General: Abdomen is flat. Bowel sounds are normal. There is no distension.     Palpations: Abdomen is soft.     Tenderness: There is no abdominal tenderness. There is no right CVA tenderness or left CVA tenderness.     Hernia: No hernia is present.  Musculoskeletal:     Cervical back: Full passive range of motion without pain and neck supple.     Right lower leg: No edema.     Left lower leg: No edema.  Skin:    General: Skin is warm.     Capillary Refill: Capillary refill takes less than 2  seconds.  Neurological:     General: No focal deficit present.     Mental Status: She is alert, oriented to person, place, and time and easily aroused. Mental status is at baseline.     GCS: GCS eye subscore is 4. GCS verbal subscore is 5. GCS motor subscore is 6.     Motor: No weakness.  Psychiatric:        Attention and Perception: Attention and perception normal.        Mood and Affect: Mood and affect normal.        Speech: Speech normal.        Behavior: Behavior normal. Behavior is cooperative.        Thought Content: Thought content normal. Thought content does not include homicidal or suicidal ideation. Thought content does not include homicidal or suicidal plan.        Cognition and Memory: Cognition and memory normal.        Judgment: Judgment normal.     Results for orders placed or performed in visit on 02/03/24  Bayer DCA Hb A1c Waived   Collection Time: 02/03/24  2:33 PM  Result Value Ref Range   HB A1C (BAYER DCA - WAIVED) 5.8 (H) 4.8 - 5.6 %  CMP14+EGFR   Collection Time: 02/03/24  2:35 PM  Result Value Ref Range   Glucose 96 70 - 99 mg/dL   BUN 20 8 - 27 mg/dL   Creatinine, Ser 1.61 0.57 - 1.00 mg/dL   eGFR 57 (L) >09 UE/AVW/0.98   BUN/Creatinine Ratio 20 12 - 28   Sodium 139 134 - 144 mmol/L   Potassium 4.3 3.5 - 5.2 mmol/L   Chloride 99 96 - 106 mmol/L   CO2 26 20 - 29 mmol/L   Calcium  9.5 8.7 - 10.3 mg/dL   Total Protein 6.3 6.0 - 8.5 g/dL   Albumin 4.4 3.8 - 4.8 g/dL  Globulin, Total 1.9 1.5 - 4.5 g/dL   Bilirubin Total 0.4 0.0 - 1.2 mg/dL   Alkaline Phosphatase 60 44 - 121 IU/L   AST 18 0 - 40 IU/L   ALT 17 0 - 32 IU/L  Vitamin B12   Collection Time: 02/03/24  2:35 PM  Result Value Ref Range   Vitamin B-12 227 (L) 232 - 1,245 pg/mL  Microalbumin/Creatinine Ratio, Urine   Collection Time: 02/03/24  2:40 PM  Result Value Ref Range   Creatinine, Urine 24.4 Not Estab. mg/dL   Microalbumin, Urine <4.0 Not Estab. ug/mL   Microalb/Creat Ratio <12 0 -  29 mg/g creat       04/07/2024   11:39 AM 12/02/2023    1:20 PM 11/19/2023    2:00 PM 07/30/2023   12:36 PM 03/22/2023   10:36 AM  Depression screen PHQ 2/9  Decreased Interest 0 0 0 0 0  Down, Depressed, Hopeless 0 0 0 0 0  PHQ - 2 Score 0 0 0 0 0  Altered sleeping    0 0  Tired, decreased energy    2 3  Change in appetite    0 0  Feeling bad or failure about yourself     0 0  Trouble concentrating    0 0  Moving slowly or fidgety/restless    0 0  Suicidal thoughts    0 0  PHQ-9 Score    2 3  Difficult doing work/chores    Somewhat difficult Not difficult at all       04/07/2024   11:39 AM 07/30/2023   12:36 PM 03/22/2023   10:36 AM  GAD 7 : Generalized Anxiety Score  Nervous, Anxious, on Edge 0 0 0  Control/stop worrying 0 0 0  Worry too much - different things 0 0 0  Trouble relaxing 0 0 0  Restless 0 0 0  Easily annoyed or irritable 0 0 0  Afraid - awful might happen 0 0 0  Total GAD 7 Score 0 0 0  Anxiety Difficulty Not difficult at all Not difficult at all    Pertinent labs & imaging results that were available during my care of the patient were reviewed by me and considered in my medical decision making.  Assessment & Plan:  Illyria was seen today for dysuria.  Diagnoses and all orders for this visit:  Dysuria Based on UA will treat as below. Will await results of culture. Previous UTI susceptible to bactrim. Discussed return precautions. Recommend patient follow up with PCP to discuss jardiance .  -     Urinalysis, Routine w reflex microscopic -     Urine Culture -     sulfamethoxazole-trimethoprim (BACTRIM DS) 800-160 MG tablet; Take 1 tablet by mouth 2 (two) times daily. -     fluconazole  (DIFLUCAN ) 150 MG tablet; Take 1 tablet (150 mg total) by mouth once for 1 dose. May take second dose 72 hours after first dose if symptoms remain  Hypertension associated with diabetes (HCC) Elevated BP in office today. Recommend patient monitor at home and follow up with  PCP     Continue all other maintenance medications.  Follow up plan: Return if symptoms worsen or fail to improve.   Continue healthy lifestyle choices, including diet (rich in fruits, vegetables, and lean proteins, and low in salt and simple carbohydrates) and exercise (at least 30 minutes of moderate physical activity daily).  Written and verbal instructions provided   The above assessment and management  plan was discussed with the patient. The patient verbalized understanding of and has agreed to the management plan. Patient is aware to call the clinic if they develop any new symptoms or if symptoms persist or worsen. Patient is aware when to return to the clinic for a follow-up visit. Patient educated on when it is appropriate to go to the emergency department.   Jacqualyn Mates, DNP-FNP [facility] [address] [place], [place] [address] [phone]

## 2024-04-07 NOTE — Patient Instructions (Signed)
 Take one diflucan  now and one at end of antibiotics

## 2024-04-08 DIAGNOSIS — Z1231 Encounter for screening mammogram for malignant neoplasm of breast: Secondary | ICD-10-CM | POA: Diagnosis not present

## 2024-04-08 LAB — HM MAMMOGRAPHY

## 2024-04-10 ENCOUNTER — Ambulatory Visit (INDEPENDENT_AMBULATORY_CARE_PROVIDER_SITE_OTHER)

## 2024-04-10 ENCOUNTER — Telehealth: Payer: Self-pay

## 2024-04-10 DIAGNOSIS — E538 Deficiency of other specified B group vitamins: Secondary | ICD-10-CM | POA: Diagnosis not present

## 2024-04-10 NOTE — Progress Notes (Signed)
 Patient is in office today for a nurse visit for B12 Injection. Patient Injection was given in the  Left deltoid. Patient tolerated injection well.

## 2024-04-10 NOTE — Telephone Encounter (Signed)
 Patient is in office today for a B12 injection and would like to know when her urine culture will be reviewed.

## 2024-04-11 LAB — URINE CULTURE

## 2024-04-13 ENCOUNTER — Encounter: Payer: Self-pay | Admitting: Family Medicine

## 2024-04-13 NOTE — Progress Notes (Signed)
 UTI confirmed on exam. Continue current regimen.

## 2024-04-13 NOTE — Telephone Encounter (Signed)
Patient aware, verbalized understanding.

## 2024-04-13 NOTE — Telephone Encounter (Signed)
 Prescribed abx is appropriate.

## 2024-05-12 ENCOUNTER — Ambulatory Visit (INDEPENDENT_AMBULATORY_CARE_PROVIDER_SITE_OTHER)

## 2024-05-12 DIAGNOSIS — E538 Deficiency of other specified B group vitamins: Secondary | ICD-10-CM | POA: Diagnosis not present

## 2024-05-12 NOTE — Progress Notes (Signed)
 Patient is in office today for a nurse visit for B12 Injection. Patient Injection was given in the  Right deltoid. Patient tolerated injection well.

## 2024-06-15 ENCOUNTER — Ambulatory Visit (INDEPENDENT_AMBULATORY_CARE_PROVIDER_SITE_OTHER)

## 2024-06-15 DIAGNOSIS — E538 Deficiency of other specified B group vitamins: Secondary | ICD-10-CM | POA: Diagnosis not present

## 2024-06-15 NOTE — Progress Notes (Signed)
 Patient is in office today for a nurse visit for B12 Injection. Patient Injection was given in the  Left deltoid. Patient tolerated injection well.

## 2024-06-17 DIAGNOSIS — H401123 Primary open-angle glaucoma, left eye, severe stage: Secondary | ICD-10-CM | POA: Diagnosis not present

## 2024-06-17 DIAGNOSIS — H401111 Primary open-angle glaucoma, right eye, mild stage: Secondary | ICD-10-CM | POA: Diagnosis not present

## 2024-06-17 DIAGNOSIS — Z961 Presence of intraocular lens: Secondary | ICD-10-CM | POA: Diagnosis not present

## 2024-06-23 ENCOUNTER — Encounter (HOSPITAL_BASED_OUTPATIENT_CLINIC_OR_DEPARTMENT_OTHER): Payer: Self-pay | Admitting: Cardiology

## 2024-06-23 ENCOUNTER — Ambulatory Visit (HOSPITAL_BASED_OUTPATIENT_CLINIC_OR_DEPARTMENT_OTHER): Admitting: Cardiology

## 2024-06-23 VITALS — BP 132/58 | HR 71 | Ht 59.0 in | Wt 187.0 lb

## 2024-06-23 DIAGNOSIS — E1159 Type 2 diabetes mellitus with other circulatory complications: Secondary | ICD-10-CM | POA: Diagnosis not present

## 2024-06-23 DIAGNOSIS — I2510 Atherosclerotic heart disease of native coronary artery without angina pectoris: Secondary | ICD-10-CM | POA: Diagnosis not present

## 2024-06-23 DIAGNOSIS — I152 Hypertension secondary to endocrine disorders: Secondary | ICD-10-CM

## 2024-06-23 DIAGNOSIS — I420 Dilated cardiomyopathy: Secondary | ICD-10-CM | POA: Diagnosis not present

## 2024-06-23 DIAGNOSIS — E1169 Type 2 diabetes mellitus with other specified complication: Secondary | ICD-10-CM

## 2024-06-23 DIAGNOSIS — E7800 Pure hypercholesterolemia, unspecified: Secondary | ICD-10-CM | POA: Diagnosis not present

## 2024-06-23 MED ORDER — EMPAGLIFLOZIN 10 MG PO TABS: 10.0000 mg | ORAL_TABLET | Freq: Every day | ORAL | 3 refills | Status: AC

## 2024-06-23 MED ORDER — PRAVASTATIN SODIUM 10 MG PO TABS: 10.0000 mg | ORAL_TABLET | Freq: Every day | ORAL | 3 refills | Status: AC

## 2024-06-23 NOTE — Patient Instructions (Signed)
 Medication Instructions:   Your physician recommends that you continue on your current medications as directed. Please refer to the Current Medication list given to you today.   *If you need a refill on your cardiac medications before your next appointment, please call your pharmacy*  Lab Work:  None ordered.  If you have labs (blood work) drawn today and your tests are completely normal, you will receive your results only by: MyChart Message (if you have MyChart) OR A paper copy in the mail If you have any lab test that is abnormal or we need to change your treatment, we will call you to review the results.  Testing/Procedures:  Your physician has requested that you have an echocardiogram. Echocardiography is a painless test that uses sound waves to create images of your heart. It provides your doctor with information about the size and shape of your heart and how well your heart's chambers and valves are working. This procedure takes approximately one hour. There are no restrictions for this procedure. Please do NOT wear cologne, perfume or lotions (deodorant is allowed). Please arrive 15 minutes prior to your appointment time.  Follow-Up: At [facility], you and your health needs are our priority.  As part of our continuing mission to provide you with exceptional heart care, our providers are all part of one team.  This team includes your primary Cardiologist (physician) and Advanced Practice Providers or APPs (Physician Assistants and Nurse Practitioners) who all work together to provide you with the care you need, when you need it.  Your next appointment:   1 year(s)  Provider:   Shelda Bruckner, MD, Rosaline Bane, NP, or Reche Finder, NP    We recommend signing up for the patient portal called MyChart.  Sign up information is provided on this After Visit Summary.  MyChart is used to connect with patients for Virtual Visits (Telemedicine).  Patients are able  to view lab/test results, encounter notes, upcoming appointments, etc.  Non-urgent messages can be sent to your provider as well.   To learn more about what you can do with MyChart, go to [email or web address].

## 2024-06-23 NOTE — Progress Notes (Signed)
 Cardiology Office Note:  .    Date:  06/23/2024  ID:  Robin Lindsey, DOB December 16, 1945, MRN [id number] PCP: Lavell Bari LABOR, FNP  [facility] Providers Cardiologist:  Shelda Bruckner, MD     History of Present Illness: .    Robin Lindsey is a 78 y.o. female with a hx of nonischemic cardiomyopathy diagnosed 2024, hypertension, diabetes, who is seen for follow up today. I initially met her 02/18/23 as a new consult at the request of Lavell Bari LABOR, FNP for the evaluation and management of dyspnea on exertion.   Cardiovascular History: Echo on 03/14/23 showed 3D EF 42% without regional wall abnormalities. ETT with 4.8 METs without ischemic changes. Cath 05/22/2023 revealed no aortic valve stenosis and angiographically normal coronary arteries. There was borderline pulmonary hypertension with mean PA and PCWP of 24 mmHg; LVEDP only 16 mmHg.    Today: Doing well. Had occasional high blood pressure readings at other office visits, but has been 120-130/60-70 at home. Has occasional lightheadedness; checks her BP and it is usually within the normal range. Breathing is better. Has lost about 30 lbs. No swelling issues. Able to work in the yard, plant flowers. Does cooking, housework.   Can't take higher doses of the semaglutide  as it makes her feel sick, but her appetite is very high off of it completely.   ROS:  Denies chest pain, shortness of breath at rest or with normal exertion. No PND, orthopnea, LE edema or unexpected weight gain. No syncope or palpitations. ROS otherwise negative except as noted.   Studies Reviewed: SABRA    EKG Interpretation Date/Time:  Tuesday June 23 2024 11:13:25 EDT Ventricular Rate:  64 PR Interval:  154 QRS Duration:  82 QT Interval:  390 QTC Calculation: 402 R Axis:   -65  Text Interpretation: Normal sinus rhythm Left anterior fascicular block When compared with ECG of 11-Feb-2012 10:42, No significant change was found Confirmed by Bruckner Shelda [phone]) on 07/06/2024 1:29:52 AM      Physical Exam:    VS:  BP (!) 132/58 (BP Location: Left Arm, Patient Position: Sitting, Cuff Size: Normal)   Pulse 71   Ht 4' 11 (1.499 m)   Wt 187 lb (84.8 kg)   SpO2 99%   BMI 37.77 kg/m    Wt Readings from Last 3 Encounters:  06/23/24 187 lb (84.8 kg)  04/07/24 187 lb (84.8 kg)  02/03/24 193 lb 12.8 oz (87.9 kg)    GEN: Well nourished, well developed in no acute distress HEENT: Normal, moist mucous membranes NECK: No JVD CARDIAC: regular rhythm, normal S1 and S2, no rubs or gallops. No murmur. VASCULAR: Radial and DP pulses 2+ bilaterally. No carotid bruits RESPIRATORY:  Clear to auscultation without rales, wheezing or rhonchi  ABDOMEN: Soft, non-tender, non-distended MUSCULOSKELETAL:  Ambulates independently SKIN: Warm and dry, no edema NEUROLOGIC:  Alert and oriented x 3. No focal neuro deficits noted. PSYCHIATRIC:  Normal affect   ASSESSMENT AND PLAN: .    Cardiomyopathy, nonsichemic -on ARB -did not tolerate 25 or 12.5 mg of metoprolol ; discontinued -tolerating Jardiance  -due for recheck echo, ordered today -next med would be spironolactone if K allows   Dyspnea on exertion Nonobstructive CAD Aortic atherosclerosis Hypercholesterolemia -minimal CAD on CT, angiographically normal on cath -tolerating pravastatin  -aspirin  long term upsets her stomach, but no bleeding. -breathing has improved as she has lost weight   Hypertension -continue valsartan -hctz   Type II diabetes -on SGLT2i, semaglutide    Cardiac risk  counseling and prevention recommendations: -recommend heart healthy/Mediterranean diet, with whole grains, fruits, vegetable, fish, lean meats, nuts, and olive oil. Limit salt. -recommend moderate walking, 3-5 times/week for 30-50 minutes each session. Aim for at least 150 minutes.week. Goal should be pace of 3 miles/hours, or walking 1.5 miles in 30 minutes -recommend avoidance of tobacco products. Avoid  excess alcohol.  Dispo: Follow-up in 12 months if echo has normalized  Signed, Shelda Bruckner, MD

## 2024-07-03 DIAGNOSIS — M65341 Trigger finger, right ring finger: Secondary | ICD-10-CM | POA: Diagnosis not present

## 2024-07-03 DIAGNOSIS — M65342 Trigger finger, left ring finger: Secondary | ICD-10-CM | POA: Diagnosis not present

## 2024-07-13 ENCOUNTER — Other Ambulatory Visit: Payer: Self-pay | Admitting: Family

## 2024-07-13 DIAGNOSIS — E1169 Type 2 diabetes mellitus with other specified complication: Secondary | ICD-10-CM

## 2024-07-17 DIAGNOSIS — M65341 Trigger finger, right ring finger: Secondary | ICD-10-CM | POA: Diagnosis not present

## 2024-07-21 ENCOUNTER — Ambulatory Visit (INDEPENDENT_AMBULATORY_CARE_PROVIDER_SITE_OTHER): Admitting: *Deleted

## 2024-07-21 DIAGNOSIS — E538 Deficiency of other specified B group vitamins: Secondary | ICD-10-CM

## 2024-07-21 NOTE — Progress Notes (Signed)
 Patient is in office today for a nurse visit for B12 Injection. Patient Injection was given in the  Right deltoid. Patient tolerated injection well.

## 2024-07-24 ENCOUNTER — Telehealth: Payer: Self-pay | Admitting: *Deleted

## 2024-07-24 NOTE — Telephone Encounter (Signed)
 LOV faxed to [facility] as I called and this is all they need. She does not see where anything has been requested in a fax

## 2024-07-24 NOTE — Telephone Encounter (Signed)
 Copied from CRM #[id number]. Topic: General - Other >> Jul 20, 2024 12:54 PM Joesph PARAS wrote: Reason for CRM: Chap(?) with [facility] is calling to inquire if we received something faxed. Representative does not know if it was an order that needed to be signed, a request for records, or otherwise. Representative did not understand several requests for clarification. Provided correct fax number, rep left callback of [phone].

## 2024-08-03 ENCOUNTER — Encounter: Payer: Self-pay | Admitting: Family

## 2024-08-03 ENCOUNTER — Ambulatory Visit (INDEPENDENT_AMBULATORY_CARE_PROVIDER_SITE_OTHER): Payer: Medicare Other | Admitting: Family

## 2024-08-03 VITALS — BP 132/72 | HR 76 | Temp 97.8°F | Ht 59.0 in | Wt 192.4 lb

## 2024-08-03 DIAGNOSIS — E1159 Type 2 diabetes mellitus with other circulatory complications: Secondary | ICD-10-CM | POA: Diagnosis not present

## 2024-08-03 DIAGNOSIS — M8580 Other specified disorders of bone density and structure, unspecified site: Secondary | ICD-10-CM | POA: Diagnosis not present

## 2024-08-03 DIAGNOSIS — M791 Myalgia, unspecified site: Secondary | ICD-10-CM | POA: Diagnosis not present

## 2024-08-03 DIAGNOSIS — E559 Vitamin D deficiency, unspecified: Secondary | ICD-10-CM

## 2024-08-03 DIAGNOSIS — I152 Hypertension secondary to endocrine disorders: Secondary | ICD-10-CM | POA: Diagnosis not present

## 2024-08-03 DIAGNOSIS — Z0000 Encounter for general adult medical examination without abnormal findings: Secondary | ICD-10-CM | POA: Diagnosis not present

## 2024-08-03 DIAGNOSIS — G4733 Obstructive sleep apnea (adult) (pediatric): Secondary | ICD-10-CM | POA: Diagnosis not present

## 2024-08-03 DIAGNOSIS — E538 Deficiency of other specified B group vitamins: Secondary | ICD-10-CM | POA: Diagnosis not present

## 2024-08-03 DIAGNOSIS — G629 Polyneuropathy, unspecified: Secondary | ICD-10-CM

## 2024-08-03 DIAGNOSIS — Z0001 Encounter for general adult medical examination with abnormal findings: Secondary | ICD-10-CM

## 2024-08-03 DIAGNOSIS — E1169 Type 2 diabetes mellitus with other specified complication: Secondary | ICD-10-CM

## 2024-08-03 DIAGNOSIS — K219 Gastro-esophageal reflux disease without esophagitis: Secondary | ICD-10-CM | POA: Diagnosis not present

## 2024-08-03 DIAGNOSIS — F411 Generalized anxiety disorder: Secondary | ICD-10-CM

## 2024-08-03 LAB — BAYER DCA HB A1C WAIVED: HB A1C (BAYER DCA - WAIVED): 5.9 % — ABNORMAL HIGH (ref 4.8–5.6)

## 2024-08-03 LAB — LIPID PANEL

## 2024-08-03 MED ORDER — OZEMPIC (0.25 OR 0.5 MG/DOSE) 2 MG/3ML ~~LOC~~ SOPN: 0.5000 mg | PEN_INJECTOR | SUBCUTANEOUS | 2 refills | Status: DC

## 2024-08-03 NOTE — Progress Notes (Signed)
 Subjective:    Patient ID: Robin Lindsey, female    DOB: [birth year], 78 y.o.   MRN: [id number]  Chief Complaint  Patient presents with   Medical Management of Chronic Issues   Pt presents to the office today for CPE.    She is followed by Pulmonologist's for OSA. Using CPAP and doing well. Continues to have intermittent SOB. Was encouraged to lose weight.    She followed by Cardiologists annually.     She is morbid obese with a 38 with DM and HTN.     She has osteoporosis and takes Prolia  every 6 months. This has decreased to Osteopenia.    She is taking Ozempic .     08/03/2024   11:09 AM 06/23/2024   11:06 AM 04/07/2024   11:35 AM  Last 3 Weights  Weight (lbs) 192 lb 6.4 oz 187 lb 187 lb  Weight (kg) 87.272 kg 84.823 kg 84.823 kg     Diabetes She presents for her follow-up diabetic visit. She has type 2 diabetes mellitus. Hypoglycemia symptoms include nervousness/anxiousness. Associated symptoms include foot paresthesias. Pertinent negatives for diabetes include no blurred vision. Diabetic complications include peripheral neuropathy. Risk factors for coronary artery disease include diabetes mellitus, dyslipidemia, hypertension, sedentary lifestyle and post-menopausal. She is following a generally unhealthy diet. Her overall blood glucose range is 110-130 mg/dl. (Does not check glucose regularly ) Eye exam is current.  Hypertension This is a chronic problem. The current episode started more than 1 year ago. The problem has been waxing and waning since onset. The problem is uncontrolled. Associated symptoms include anxiety and malaise/fatigue. Pertinent negatives include no blurred vision, peripheral edema or shortness of breath. Risk factors for coronary artery disease include dyslipidemia, obesity, diabetes mellitus and sedentary lifestyle. The current treatment provides moderate improvement.  Gastroesophageal Reflux She complains of belching and heartburn. This is a chronic  problem. The current episode started more than 1 year ago. The problem occurs occasionally. The symptoms are aggravated by medications and certain foods. Risk factors include obesity. She has tried a PPI for the symptoms. The treatment provided moderate relief.  Anxiety Presents for follow-up visit. Symptoms include excessive worry and nervous/anxious behavior. Patient reports no shortness of breath. Symptoms occur occasionally. The severity of symptoms is moderate.        Review of Systems  Constitutional:  Positive for malaise/fatigue.  Eyes:  Negative for blurred vision.  Respiratory:  Negative for shortness of breath.   Gastrointestinal:  Positive for heartburn.  Psychiatric/Behavioral:  The patient is nervous/anxious.   All other systems reviewed and are negative.  Family History  Problem Relation Age of Onset   Colon polyps Mother    Diabetes Mother    Pulmonary fibrosis Mother    Colon cancer Maternal Aunt    Pulmonary fibrosis Maternal Aunt    Heart disease Father    Stroke Father    Dementia Father    Lung disease Father        realted to asbestos exposure   Thyroid  disease Brother    Diabetes Brother    Hypertension Brother    Meniere's disease Brother    Pulmonary fibrosis Maternal Aunt    Anesthesia problems Neg Hx    Hypotension Neg Hx    Malignant hyperthermia Neg Hx    Pseudochol deficiency Neg Hx    Social History   Socioeconomic History   Marital status: Married    Spouse name: Louis   Number of children: 3  Years of education: 1   Highest education level: 12th grade  Occupational History   Occupation: retired  Tobacco Use   Smoking status: Former    Current packs/day: 0.00    Average packs/day: 1 pack/day for 5.0 years (5.0 ttl pk-yrs)    Types: Cigarettes    Start date: 03/10/1962    Quit date: 03/11/1967    Years since quitting: 57.4   Smokeless tobacco: Never   Tobacco comments:    smoked for 4-5 years from age 39- 38 years old  Vaping  Use   Vaping status: Never Used  Substance and Sexual Activity   Alcohol use: No    Alcohol/week: 0.0 standard drinks of alcohol   Drug use: No   Sexual activity: Not Currently  Other Topics Concern   Not on file  Social History Narrative   Not on file   Social Drivers of Health   Financial Resource Strain: Low Risk  (12/02/2023)   Overall Financial Resource Strain (CARDIA)    Difficulty of Paying Living Expenses: Not hard at all  Food Insecurity: No Food Insecurity (12/02/2023)   Hunger Vital Sign    Worried About Running Out of Food in the Last Year: Never true    Ran Out of Food in the Last Year: Never true  Transportation Needs: No Transportation Needs (12/02/2023)   PRAPARE - Administrator, Civil Service (Medical): No    Lack of Transportation (Non-Medical): No  Physical Activity: Insufficiently Active (12/02/2023)   Exercise Vital Sign    Days of Exercise per Week: 3 days    Minutes of Exercise per Session: 30 min  Stress: No Stress Concern Present (12/02/2023)   [employer] of Occupational Health - Occupational Stress Questionnaire    Feeling of Stress : Not at all  Social Connections: Moderately Integrated (12/02/2023)   Social Connection and Isolation Panel    Frequency of Communication with Friends and Family: More than three times a week    Frequency of Social Gatherings with Friends and Family: Three times a week    Attends Religious Services: More than 4 times per year    Active Member of Clubs or Organizations: No    Attends Banker Meetings: Never    Marital Status: Married       Objective:   Physical Exam Vitals reviewed.  Constitutional:      General: She is not in acute distress.    Appearance: She is well-developed. She is obese.  HENT:     Head: Normocephalic and atraumatic.     Right Ear: Tympanic membrane normal.     Left Ear: Tympanic membrane normal.  Eyes:     Pupils: Pupils are equal, round, and reactive to  light.  Neck:     Thyroid : No thyromegaly.  Cardiovascular:     Rate and Rhythm: Normal rate and regular rhythm.     Heart sounds: Normal heart sounds. No murmur heard. Pulmonary:     Effort: Pulmonary effort is normal. No respiratory distress.     Breath sounds: Normal breath sounds. No wheezing.  Abdominal:     General: Bowel sounds are normal. There is no distension.     Palpations: Abdomen is soft.     Tenderness: There is no abdominal tenderness.  Musculoskeletal:        General: No tenderness. Normal range of motion.     Cervical back: Normal range of motion and neck supple.  Skin:    General: Skin  is warm and dry.  Neurological:     Mental Status: She is alert and oriented to person, place, and time.     Cranial Nerves: No cranial nerve deficit.     Deep Tendon Reflexes: Reflexes are normal and symmetric.  Psychiatric:        Behavior: Behavior normal.        Thought Content: Thought content normal.        Judgment: Judgment normal.       BP 132/72   Pulse 76   Temp 97.8 F (36.6 C)   Ht 4' 11 (1.499 m)   Wt 192 lb 6.4 oz (87.3 kg)   BMI 38.86 kg/m      Assessment & Plan:  YAREMI STAHLMAN comes in today with chief complaint of Medical Management of Chronic Issues   Diagnosis and orders addressed:  1. Hypertension associated with diabetes (HCC) - CMP14+EGFR - CBC with Differential/Platelet - TSH  2. Gastroesophageal reflux disease, unspecified whether esophagitis present - CMP14+EGFR - CBC with Differential/Platelet  3. Vitamin D  deficiency - CMP14+EGFR - CBC with Differential/Platelet  4. Type 2 diabetes mellitus with other specified complication, without long-term current use of insulin (HCC) - Bayer DCA Hb A1c Waived - CMP14+EGFR - CBC with Differential/Platelet - Lipid panel - TSH - Vitamin B12 - Semaglutide ,0.25 or 0.5MG /DOS, (OZEMPIC , 0.25 OR 0.5 MG/DOSE,) 2 MG/3ML SOPN; Inject 0.5 mg into the skin once a week.  Dispense: 9 mL; Refill:  2  5. Osteopenia, unspecified location - CMP14+EGFR - CBC with Differential/Platelet  6. GAD (generalized anxiety disorder) - CMP14+EGFR - CBC with Differential/Platelet  7. Morbid obesity (HCC) - CMP14+EGFR - CBC with Differential/Platelet  8. Myalgia due to statin - CMP14+EGFR - CBC with Differential/Platelet  9. OSA (obstructive sleep apnea) - CMP14+EGFR - CBC with Differential/Platelet  10. Peripheral polyneuropathy - CMP14+EGFR - CBC with Differential/Platelet  11. Annual physical exam (Primary) - Bayer DCA Hb A1c Waived - CMP14+EGFR - CBC with Differential/Platelet - Lipid panel - TSH - Vitamin B12 - Semaglutide ,0.25 or 0.5MG /DOS, (OZEMPIC , 0.25 OR 0.5 MG/DOSE,) 2 MG/3ML SOPN; Inject 0.5 mg into the skin once a week.  Dispense: 9 mL; Refill: 2  12. Hyperlipidemia associated with type 2 diabetes mellitus (HCC) - CMP14+EGFR - CBC with Differential/Platelet - Lipid panel  Continue current medications  Will increase Ozempic  to 0.5 mg from 0.25 mg Low carb diet  Encourage healthy diet and exercise  Follow up in 3 months    Bari Learn, FNP

## 2024-08-03 NOTE — Patient Instructions (Signed)
 Health Maintenance After Age 80 After age 27, you are at a higher risk for certain long-term diseases and infections as well as injuries from falls. Falls are a major cause of broken bones and head injuries in people who are older than age 73. Getting regular preventive care can help to keep you healthy and well. Preventive care includes getting regular testing and making lifestyle changes as recommended by your health care provider. Talk with your health care provider about: Which screenings and tests you should have. A screening is a test that checks for a disease when you have no symptoms. A diet and exercise plan that is right for you. What should I know about screenings and tests to prevent falls? Screening and testing are the best ways to find a health problem early. Early diagnosis and treatment give you the best chance of managing medical conditions that are common after age 90. Certain conditions and lifestyle choices may make you more likely to have a fall. Your health care provider may recommend: Regular vision checks. Poor vision and conditions such as cataracts can make you more likely to have a fall. If you wear glasses, make sure to get your prescription updated if your vision changes. Medicine review. Work with your health care provider to regularly review all of the medicines you are taking, including over-the-counter medicines. Ask your health care provider about any side effects that may make you more likely to have a fall. Tell your health care provider if any medicines that you take make you feel dizzy or sleepy. Strength and balance checks. Your health care provider may recommend certain tests to check your strength and balance while standing, walking, or changing positions. Foot health exam. Foot pain and numbness, as well as not wearing proper footwear, can make you more likely to have a fall. Screenings, including: Osteoporosis screening. Osteoporosis is a condition that causes  the bones to get weaker and break more easily. Blood pressure screening. Blood pressure changes and medicines to control blood pressure can make you feel dizzy. Depression screening. You may be more likely to have a fall if you have a fear of falling, feel depressed, or feel unable to do activities that you used to do. Alcohol  use screening. Using too much alcohol  can affect your balance and may make you more likely to have a fall. Follow these instructions at home: Lifestyle Do not drink alcohol  if: Your health care provider tells you not to drink. If you drink alcohol : Limit how much you have to: 0-1 drink a day for women. 0-2 drinks a day for men. Know how much alcohol  is in your drink. In the [place] one drink equals one 12 oz bottle of beer (355 mL), one 5 oz glass of wine (148 mL), or one 1 oz glass of hard liquor (44 mL). Do not use any products that contain nicotine or tobacco. These products include cigarettes, chewing tobacco, and vaping devices, such as e-cigarettes. If you need help quitting, ask your health care provider. Activity  Follow a regular exercise program to stay fit. This will help you maintain your balance. Ask your health care provider what types of exercise are appropriate for you. If you need a cane or walker, use it as recommended by your health care provider. Wear supportive shoes that have nonskid soles. Safety  Remove any tripping hazards, such as rugs, cords, and clutter. Install safety equipment such as grab bars in bathrooms and safety rails on stairs. Keep rooms and walkways  well-lit. General instructions Talk with your health care provider about your risks for falling. Tell your health care provider if: You fall. Be sure to tell your health care provider about all falls, even ones that seem minor. You feel dizzy, tiredness (fatigue), or off-balance. Take over-the-counter and prescription medicines only as told by your health care provider. These include  supplements. Eat a healthy diet and maintain a healthy weight. A healthy diet includes low-fat dairy products, low-fat (lean) meats, and fiber from whole grains, beans, and lots of fruits and vegetables. Stay current with your vaccines. Schedule regular health, dental, and eye exams. Summary Having a healthy lifestyle and getting preventive care can help to protect your health and wellness after age 15. Screening and testing are the best way to find a health problem early and help you avoid having a fall. Early diagnosis and treatment give you the best chance for managing medical conditions that are more common for people who are older than age 42. Falls are a major cause of broken bones and head injuries in people who are older than age 64. Take precautions to prevent a fall at home. Work with your health care provider to learn what changes you can make to improve your health and wellness and to prevent falls. This information is not intended to replace advice given to you by your health care provider. Make sure you discuss any questions you have with your health care provider. Document Revised: 04/17/2021 Document Reviewed: 04/17/2021 Elsevier Patient Education  2024 [employer].

## 2024-08-04 ENCOUNTER — Ambulatory Visit (HOSPITAL_BASED_OUTPATIENT_CLINIC_OR_DEPARTMENT_OTHER)

## 2024-08-04 ENCOUNTER — Ambulatory Visit (HOSPITAL_BASED_OUTPATIENT_CLINIC_OR_DEPARTMENT_OTHER): Payer: Self-pay | Admitting: Cardiology

## 2024-08-04 ENCOUNTER — Ambulatory Visit: Payer: Self-pay | Admitting: Family

## 2024-08-04 DIAGNOSIS — I420 Dilated cardiomyopathy: Secondary | ICD-10-CM

## 2024-08-04 LAB — ECHOCARDIOGRAM COMPLETE
Area-P 1/2: 3.85 cm2
S' Lateral: 3.25 cm

## 2024-08-04 LAB — CBC WITH DIFFERENTIAL/PLATELET
Basophils Absolute: 0.1 x10E3/uL (ref 0.0–0.2)
Basos: 1 %
EOS (ABSOLUTE): 0.2 x10E3/uL (ref 0.0–0.4)
Eos: 2 %
Hematocrit: 43.8 % (ref 34.0–46.6)
Hemoglobin: 14.1 g/dL (ref 11.1–15.9)
Immature Grans (Abs): 0 x10E3/uL (ref 0.0–0.1)
Immature Granulocytes: 0 %
Lymphocytes Absolute: 2.8 x10E3/uL (ref 0.7–3.1)
Lymphs: 34 %
MCH: 30.9 pg (ref 26.6–33.0)
MCHC: 32.2 g/dL (ref 31.5–35.7)
MCV: 96 fL (ref 79–97)
Monocytes Absolute: 0.5 x10E3/uL (ref 0.1–0.9)
Monocytes: 7 %
Neutrophils Absolute: 4.6 x10E3/uL (ref 1.4–7.0)
Neutrophils: 56 %
Platelets: 261 x10E3/uL (ref 150–450)
RBC: 4.57 x10E6/uL (ref 3.77–5.28)
RDW: 12.8 % (ref 11.7–15.4)
WBC: 8.2 x10E3/uL (ref 3.4–10.8)

## 2024-08-04 LAB — CMP14+EGFR
ALT: 20 IU/L (ref 0–32)
AST: 22 IU/L (ref 0–40)
Albumin: 4 g/dL (ref 3.8–4.8)
Alkaline Phosphatase: 57 IU/L (ref 44–121)
BUN/Creatinine Ratio: 18 (ref 12–28)
BUN: 17 mg/dL (ref 8–27)
Bilirubin Total: 0.3 mg/dL (ref 0.0–1.2)
CO2: 23 mmol/L (ref 20–29)
Calcium: 8.9 mg/dL (ref 8.7–10.3)
Chloride: 103 mmol/L (ref 96–106)
Creatinine, Ser: 0.96 mg/dL (ref 0.57–1.00)
Globulin, Total: 2 g/dL (ref 1.5–4.5)
Glucose: 88 mg/dL (ref 70–99)
Potassium: 4.4 mmol/L (ref 3.5–5.2)
Sodium: 140 mmol/L (ref 134–144)
Total Protein: 6 g/dL (ref 6.0–8.5)
eGFR: 60 mL/min/1.73 (ref >59)

## 2024-08-04 LAB — LIPID PANEL
Cholesterol, Total: 144 mg/dL (ref 100–199)
HDL: 63 mg/dL (ref >39)
LDL CALC COMMENT:: 2.3 ratio (ref 0.0–4.4)
LDL Chol Calc (NIH): 62 mg/dL (ref 0–99)
Triglycerides: 107 mg/dL (ref 0–149)
VLDL Cholesterol Cal: 19 mg/dL (ref 5–40)

## 2024-08-04 LAB — VITAMIN B12: Vitamin B-12: 624 pg/mL (ref 232–1245)

## 2024-08-04 LAB — TSH: TSH: 1.92 u[IU]/mL (ref 0.450–4.500)

## 2024-08-13 ENCOUNTER — Telehealth: Payer: Self-pay

## 2024-08-13 DIAGNOSIS — M8580 Other specified disorders of bone density and structure, unspecified site: Secondary | ICD-10-CM

## 2024-08-13 MED ORDER — DENOSUMAB 60 MG/ML ~~LOC~~ SOSY: 60.0000 mg | PREFILLED_SYRINGE | Freq: Once | SUBCUTANEOUS | Status: AC

## 2024-08-13 NOTE — Telephone Encounter (Signed)
 Prolia  sent for benefit verification

## 2024-08-14 ENCOUNTER — Telehealth: Payer: Self-pay

## 2024-08-14 NOTE — Telephone Encounter (Signed)
 Prolia  VOB initiated via [email or web address]  Next Prolia  inj DUE: 10/03/24

## 2024-08-18 ENCOUNTER — Other Ambulatory Visit (HOSPITAL_COMMUNITY): Payer: Self-pay

## 2024-08-18 NOTE — Telephone Encounter (Signed)
 Pt ready for scheduling for PROLIA  on or after : 10/03/24  Option# 1: Buy/Bill (Office supplied medication)  Out-of-pocket cost due at time of clinic visit: $352  Number of injection/visits approved: 2  Primary: UHC-MEDICARE Prolia  co-insurance: 20% Admin fee co-insurance: $20  Secondary: --- Prolia  co-insurance:  Admin fee co-insurance:   Medical Benefit Details: Date Benefits were checked: 08/18/24 Deductible: NO/ Coinsurance: 20%/ Admin Fee: $20  Prior Auth: APPROVED PA# [id number] Expiration Date: 04/02/24-04/02/25  # of doses approved: 2 ----------------------------------------------------------------------- Option# 2- Med Obtained from pharmacy:  Prolia  is no longer preferred for pharmacy benefit. Jubbonti is now preferred. Test claim for Jubbonti  Pharmacy benefit: Copay $0 (Paid to pharmacy) Admin Fee: $20 (Pay at clinic)  Prior Auth: N/A PA# Expiration Date:   # of doses approved:   If patient wants fill through the pharmacy benefit please send prescription to: [facility], and include estimated need by date in rx notes. Pharmacy will ship medication directly to the office  ** This summary of benefits is an estimation of the patient's out-of-pocket cost. Exact cost may very based on individual plan coverage.

## 2024-08-18 NOTE — Telephone Encounter (Signed)
 Robin Lindsey

## 2024-08-24 ENCOUNTER — Ambulatory Visit (INDEPENDENT_AMBULATORY_CARE_PROVIDER_SITE_OTHER): Admitting: *Deleted

## 2024-08-24 DIAGNOSIS — E538 Deficiency of other specified B group vitamins: Secondary | ICD-10-CM | POA: Diagnosis not present

## 2024-08-24 DIAGNOSIS — E559 Vitamin D deficiency, unspecified: Secondary | ICD-10-CM

## 2024-08-24 NOTE — Progress Notes (Signed)
 Patient is in office today for a nurse visit for B12 Injection. Patient Injection was given in the  Left deltoid. Patient tolerated injection well.

## 2024-08-28 ENCOUNTER — Telehealth (HOSPITAL_BASED_OUTPATIENT_CLINIC_OR_DEPARTMENT_OTHER): Payer: Self-pay

## 2024-08-28 NOTE — Telephone Encounter (Signed)
 CMN received for CPAP supplies signed by provider and faxed confirmation received

## 2024-09-09 ENCOUNTER — Telehealth (HOSPITAL_BASED_OUTPATIENT_CLINIC_OR_DEPARTMENT_OTHER): Payer: Self-pay

## 2024-09-09 NOTE — Telephone Encounter (Signed)
 CMN received for CPAP supplies from [facility] signed by provider and faxed confirmation received

## 2024-09-10 DIAGNOSIS — M79671 Pain in right foot: Secondary | ICD-10-CM | POA: Diagnosis not present

## 2024-09-10 DIAGNOSIS — M722 Plantar fascial fibromatosis: Secondary | ICD-10-CM | POA: Diagnosis not present

## 2024-09-16 ENCOUNTER — Other Ambulatory Visit: Payer: Self-pay

## 2024-09-16 DIAGNOSIS — E559 Vitamin D deficiency, unspecified: Secondary | ICD-10-CM

## 2024-09-16 DIAGNOSIS — M8580 Other specified disorders of bone density and structure, unspecified site: Secondary | ICD-10-CM

## 2024-09-16 DIAGNOSIS — M810 Age-related osteoporosis without current pathological fracture: Secondary | ICD-10-CM

## 2024-09-16 MED ORDER — JUBBONTI 60 MG/ML ~~LOC~~ SOSY
60.0000 mg | PREFILLED_SYRINGE | SUBCUTANEOUS | 0 refills | Status: AC
  Filled 2024-09-16 – 2024-09-21 (×2): qty 1, 180d supply, fill #0

## 2024-09-16 NOTE — Progress Notes (Signed)
 Robin Lindsey

## 2024-09-16 NOTE — Telephone Encounter (Signed)
 Patient notified and appt made - RX sent to [facility] pharmacy

## 2024-09-21 ENCOUNTER — Other Ambulatory Visit: Payer: Self-pay

## 2024-09-21 NOTE — Progress Notes (Signed)
 Pharmacy Patient Advocate Encounter  Insurance verification completed.   The patient is insured through [employer] claim for Jubbonti . Co-pay is $0.  This test claim was processed through [facility]- copay amounts may vary at other pharmacies due to pharmacy/plan contracts, or as the patient moves through the different stages of their insurance plan.

## 2024-09-21 NOTE — Progress Notes (Signed)
 Specialty Pharmacy Initial Fill Coordination Note  Robin Lindsey is a 78 y.o. female contacted today regarding initial fill of specialty medication(s) Denosumab -bbdz (Jubbonti)   Patient requested Courier to Provider Office   Delivery date: 09/29/24   Verified address: [facility]-[address]   Medication will be filled on 10/20.   Patient is aware of $0 copayment.

## 2024-09-23 ENCOUNTER — Ambulatory Visit (INDEPENDENT_AMBULATORY_CARE_PROVIDER_SITE_OTHER): Admitting: *Deleted

## 2024-09-23 DIAGNOSIS — E538 Deficiency of other specified B group vitamins: Secondary | ICD-10-CM

## 2024-09-23 DIAGNOSIS — E559 Vitamin D deficiency, unspecified: Secondary | ICD-10-CM

## 2024-09-23 NOTE — Progress Notes (Signed)
 Patient is in office today for a nurse visit for B12 Injection. Patient Injection was given in the  Right deltoid. Patient tolerated injection well.

## 2024-09-24 DIAGNOSIS — S86112A Strain of other muscle(s) and tendon(s) of posterior muscle group at lower leg level, left leg, initial encounter: Secondary | ICD-10-CM | POA: Diagnosis not present

## 2024-09-24 DIAGNOSIS — M79672 Pain in left foot: Secondary | ICD-10-CM | POA: Diagnosis not present

## 2024-09-28 ENCOUNTER — Other Ambulatory Visit: Payer: Self-pay

## 2024-10-05 ENCOUNTER — Ambulatory Visit (INDEPENDENT_AMBULATORY_CARE_PROVIDER_SITE_OTHER)

## 2024-10-05 DIAGNOSIS — M8580 Other specified disorders of bone density and structure, unspecified site: Secondary | ICD-10-CM

## 2024-10-05 MED ORDER — DENOSUMAB-BBDZ 60 MG/ML ~~LOC~~ SOSY
60.0000 mg | PREFILLED_SYRINGE | Freq: Once | SUBCUTANEOUS | Status: AC
  Administered 2024-10-05: 60 mg via SUBCUTANEOUS

## 2024-10-05 NOTE — Progress Notes (Signed)
 Patient is in office today for a nurse visit for  Prolia . Patient Injection was given in the  Right arm. Patient tolerated injection well.

## 2024-10-26 ENCOUNTER — Ambulatory Visit (INDEPENDENT_AMBULATORY_CARE_PROVIDER_SITE_OTHER): Payer: Self-pay | Admitting: *Deleted

## 2024-10-26 DIAGNOSIS — E538 Deficiency of other specified B group vitamins: Secondary | ICD-10-CM | POA: Diagnosis not present

## 2024-10-26 NOTE — Progress Notes (Signed)
 Patient is in office today for a nurse visit for B12 Injection. Patient Injection was given in the  Left deltoid. Patient tolerated injection well.

## 2024-11-03 ENCOUNTER — Ambulatory Visit (INDEPENDENT_AMBULATORY_CARE_PROVIDER_SITE_OTHER): Payer: Self-pay | Admitting: Family

## 2024-11-03 VITALS — BP 115/60 | HR 70 | Temp 97.1°F | Ht 59.0 in | Wt 188.6 lb

## 2024-11-03 DIAGNOSIS — K219 Gastro-esophageal reflux disease without esophagitis: Secondary | ICD-10-CM

## 2024-11-03 DIAGNOSIS — E538 Deficiency of other specified B group vitamins: Secondary | ICD-10-CM

## 2024-11-03 DIAGNOSIS — F411 Generalized anxiety disorder: Secondary | ICD-10-CM

## 2024-11-03 DIAGNOSIS — E1169 Type 2 diabetes mellitus with other specified complication: Secondary | ICD-10-CM | POA: Diagnosis not present

## 2024-11-03 DIAGNOSIS — I152 Hypertension secondary to endocrine disorders: Secondary | ICD-10-CM

## 2024-11-03 DIAGNOSIS — E1159 Type 2 diabetes mellitus with other circulatory complications: Secondary | ICD-10-CM

## 2024-11-03 DIAGNOSIS — G4733 Obstructive sleep apnea (adult) (pediatric): Secondary | ICD-10-CM

## 2024-11-03 MED ORDER — OZEMPIC (0.25 OR 0.5 MG/DOSE) 2 MG/3ML ~~LOC~~ SOPN: 0.5000 mg | PEN_INJECTOR | SUBCUTANEOUS | 2 refills | Status: AC

## 2024-11-03 NOTE — Patient Instructions (Signed)
 Health Maintenance After Age 80 After age 27, you are at a higher risk for certain long-term diseases and infections as well as injuries from falls. Falls are a major cause of broken bones and head injuries in people who are older than age 73. Getting regular preventive care can help to keep you healthy and well. Preventive care includes getting regular testing and making lifestyle changes as recommended by your health care provider. Talk with your health care provider about: Which screenings and tests you should have. A screening is a test that checks for a disease when you have no symptoms. A diet and exercise plan that is right for you. What should I know about screenings and tests to prevent falls? Screening and testing are the best ways to find a health problem early. Early diagnosis and treatment give you the best chance of managing medical conditions that are common after age 90. Certain conditions and lifestyle choices may make you more likely to have a fall. Your health care provider may recommend: Regular vision checks. Poor vision and conditions such as cataracts can make you more likely to have a fall. If you wear glasses, make sure to get your prescription updated if your vision changes. Medicine review. Work with your health care provider to regularly review all of the medicines you are taking, including over-the-counter medicines. Ask your health care provider about any side effects that may make you more likely to have a fall. Tell your health care provider if any medicines that you take make you feel dizzy or sleepy. Strength and balance checks. Your health care provider may recommend certain tests to check your strength and balance while standing, walking, or changing positions. Foot health exam. Foot pain and numbness, as well as not wearing proper footwear, can make you more likely to have a fall. Screenings, including: Osteoporosis screening. Osteoporosis is a condition that causes  the bones to get weaker and break more easily. Blood pressure screening. Blood pressure changes and medicines to control blood pressure can make you feel dizzy. Depression screening. You may be more likely to have a fall if you have a fear of falling, feel depressed, or feel unable to do activities that you used to do. Alcohol  use screening. Using too much alcohol  can affect your balance and may make you more likely to have a fall. Follow these instructions at home: Lifestyle Do not drink alcohol  if: Your health care provider tells you not to drink. If you drink alcohol : Limit how much you have to: 0-1 drink a day for women. 0-2 drinks a day for men. Know how much alcohol  is in your drink. In the [place] one drink equals one 12 oz bottle of beer (355 mL), one 5 oz glass of wine (148 mL), or one 1 oz glass of hard liquor (44 mL). Do not use any products that contain nicotine or tobacco. These products include cigarettes, chewing tobacco, and vaping devices, such as e-cigarettes. If you need help quitting, ask your health care provider. Activity  Follow a regular exercise program to stay fit. This will help you maintain your balance. Ask your health care provider what types of exercise are appropriate for you. If you need a cane or walker, use it as recommended by your health care provider. Wear supportive shoes that have nonskid soles. Safety  Remove any tripping hazards, such as rugs, cords, and clutter. Install safety equipment such as grab bars in bathrooms and safety rails on stairs. Keep rooms and walkways  well-lit. General instructions Talk with your health care provider about your risks for falling. Tell your health care provider if: You fall. Be sure to tell your health care provider about all falls, even ones that seem minor. You feel dizzy, tiredness (fatigue), or off-balance. Take over-the-counter and prescription medicines only as told by your health care provider. These include  supplements. Eat a healthy diet and maintain a healthy weight. A healthy diet includes low-fat dairy products, low-fat (lean) meats, and fiber from whole grains, beans, and lots of fruits and vegetables. Stay current with your vaccines. Schedule regular health, dental, and eye exams. Summary Having a healthy lifestyle and getting preventive care can help to protect your health and wellness after age 15. Screening and testing are the best way to find a health problem early and help you avoid having a fall. Early diagnosis and treatment give you the best chance for managing medical conditions that are more common for people who are older than age 42. Falls are a major cause of broken bones and head injuries in people who are older than age 64. Take precautions to prevent a fall at home. Work with your health care provider to learn what changes you can make to improve your health and wellness and to prevent falls. This information is not intended to replace advice given to you by your health care provider. Make sure you discuss any questions you have with your health care provider. Document Revised: 04/17/2021 Document Reviewed: 04/17/2021 Elsevier Patient Education  2024 [employer].

## 2024-11-03 NOTE — Progress Notes (Signed)
 Subjective:    Patient ID: Robin Lindsey, female    DOB: March 14, 1946, 78 y.o.   MRN: [id number]  Chief Complaint  Patient presents with   Medical Management of Chronic Issues   Pt presents to the office today for chronic follow up.    She is followed by Pulmonologist's for OSA. Using CPAP and doing well. Continues to have intermittent SOB. Was encouraged to lose weight.    She followed by Cardiologists annually.     She is morbid obese with a 38 with DM and HTN.     She has osteoporosis and takes Prolia  every 6 months. This has decreased to Osteopenia.    She is taking Ozempic  0.5 mg.  Her starting weight was 210. She has lost 22 lbs.      11/03/2024   11:16 AM 08/03/2024   11:09 AM 06/23/2024   11:06 AM  Last 3 Weights  Weight (lbs) 188 lb 9.6 oz 192 lb 6.4 oz 187 lb  Weight (kg) 85.548 kg 87.272 kg 84.823 kg     Diabetes She presents for her follow-up diabetic visit. She has type 2 diabetes mellitus. Hypoglycemia symptoms include nervousness/anxiousness. Associated symptoms include foot paresthesias. Pertinent negatives for diabetes include no blurred vision. Diabetic complications include peripheral neuropathy. Risk factors for coronary artery disease include diabetes mellitus, dyslipidemia, hypertension, sedentary lifestyle, post-menopausal and obesity. She is following a generally unhealthy diet. Her overall blood glucose range is 110-130 mg/dl. (Does not check glucose regularly ) Eye exam is current.  Hypertension This is a chronic problem. The current episode started more than 1 year ago. The problem has been resolved since onset. The problem is controlled. Associated symptoms include anxiety. Pertinent negatives include no blurred vision, malaise/fatigue, peripheral edema or shortness of breath. Risk factors for coronary artery disease include dyslipidemia, obesity, diabetes mellitus and sedentary lifestyle. The current treatment provides moderate improvement.   Gastroesophageal Reflux She complains of belching and heartburn. This is a chronic problem. The current episode started more than 1 year ago. The problem occurs occasionally. The symptoms are aggravated by medications and certain foods. Risk factors include obesity. She has tried a PPI for the symptoms. The treatment provided moderate relief.  Anxiety Presents for follow-up visit. Symptoms include excessive worry and nervous/anxious behavior. Patient reports no shortness of breath. Symptoms occur rarely. The severity of symptoms is moderate.        Review of Systems  Constitutional:  Negative for malaise/fatigue.  Eyes:  Negative for blurred vision.  Respiratory:  Negative for shortness of breath.   Gastrointestinal:  Positive for heartburn.  Psychiatric/Behavioral:  The patient is nervous/anxious.   All other systems reviewed and are negative.  Family History  Problem Relation Age of Onset   Colon polyps Mother    Diabetes Mother    Pulmonary fibrosis Mother    Colon cancer Maternal Aunt    Pulmonary fibrosis Maternal Aunt    Heart disease Father    Stroke Father    Dementia Father    Lung disease Father        realted to asbestos exposure   Thyroid  disease Brother    Diabetes Brother    Hypertension Brother    Meniere's disease Brother    Pulmonary fibrosis Maternal Aunt    Anesthesia problems Neg Hx    Hypotension Neg Hx    Malignant hyperthermia Neg Hx    Pseudochol deficiency Neg Hx    Social History   Socioeconomic History   Marital  status: Married    Spouse name: Louis   Number of children: 3   Years of education: 12   Highest education level: 12th grade  Occupational History   Occupation: retired  Tobacco Use   Smoking status: Former    Current packs/day: 0.00    Average packs/day: 1 pack/day for 5.0 years (5.0 ttl pk-yrs)    Types: Cigarettes    Start date: 03/10/1962    Quit date: 03/11/1967    Years since quitting: 57.6   Smokeless tobacco: Never    Tobacco comments:    smoked for 4-5 years from age 42- 58 years old  Vaping Use   Vaping status: Never Used  Substance and Sexual Activity   Alcohol use: No    Alcohol/week: 0.0 standard drinks of alcohol   Drug use: No   Sexual activity: Not Currently  Other Topics Concern   Not on file  Social History Narrative   Not on file   Social Drivers of Health   Financial Resource Strain: Low Risk  (11/03/2024)   Overall Financial Resource Strain (CARDIA)    Difficulty of Paying Living Expenses: Not very hard  Food Insecurity: No Food Insecurity (11/03/2024)   Hunger Vital Sign    Worried About Running Out of Food in the Last Year: Never true    Ran Out of Food in the Last Year: Never true  Transportation Needs: No Transportation Needs (11/03/2024)   PRAPARE - Administrator, Civil Service (Medical): No    Lack of Transportation (Non-Medical): No  Physical Activity: Insufficiently Active (11/03/2024)   Exercise Vital Sign    Days of Exercise per Week: 1 day    Minutes of Exercise per Session: 30 min  Stress: No Stress Concern Present (11/03/2024)   [employer] of Occupational Health - Occupational Stress Questionnaire    Feeling of Stress: Not at all  Social Connections: Socially Integrated (11/03/2024)   Social Connection and Isolation Panel    Frequency of Communication with Friends and Family: More than three times a week    Frequency of Social Gatherings with Friends and Family: Once a week    Attends Religious Services: More than 4 times per year    Active Member of [employer] or Organizations: Yes    Attends Banker Meetings: 1 to 4 times per year    Marital Status: Married       Objective:   Physical Exam Vitals reviewed.  Constitutional:      General: She is not in acute distress.    Appearance: She is well-developed. She is obese.  HENT:     Head: Normocephalic and atraumatic.     Right Ear: Tympanic membrane normal.     Left Ear:  Tympanic membrane normal.  Eyes:     Pupils: Pupils are equal, round, and reactive to light.  Neck:     Thyroid : No thyromegaly.  Cardiovascular:     Rate and Rhythm: Normal rate and regular rhythm.     Heart sounds: Normal heart sounds. No murmur heard. Pulmonary:     Effort: Pulmonary effort is normal. No respiratory distress.     Breath sounds: Normal breath sounds. No wheezing.  Abdominal:     General: Bowel sounds are normal. There is no distension.     Palpations: Abdomen is soft.     Tenderness: There is no abdominal tenderness.  Musculoskeletal:        General: No tenderness. Normal range of motion.  Cervical back: Normal range of motion and neck supple.  Skin:    General: Skin is warm and dry.  Neurological:     Mental Status: She is alert and oriented to person, place, and time.     Cranial Nerves: No cranial nerve deficit.     Deep Tendon Reflexes: Reflexes are normal and symmetric.  Psychiatric:        Behavior: Behavior normal.        Thought Content: Thought content normal.        Judgment: Judgment normal.       BP 115/60   Pulse 70   Temp (!) 97.1 F (36.2 C) (Temporal)   Ht 4' 11 (1.499 m)   Wt 188 lb 9.6 oz (85.5 kg)   BMI 38.09 kg/m      Assessment & Plan:  Robin Lindsey comes in today with chief complaint of Medical Management of Chronic Issues   Diagnosis and orders addressed: 1. Type 2 diabetes mellitus with other specified complication, without long-term current use of insulin (HCC) - Semaglutide ,0.25 or 0.5MG /DOS, (OZEMPIC , 0.25 OR 0.5 MG/DOSE,) 2 MG/3ML SOPN; Inject 0.5 mg into the skin once a week.  Dispense: 9 mL; Refill: 2 - CMP14+EGFR  2. Morbid obesity (HCC) - CMP14+EGFR  3. GAD (generalized anxiety disorder) (Primary) - CMP14+EGFR  4. Gastroesophageal reflux disease, unspecified whether esophagitis present - CMP14+EGFR  5. Hypertension associated with diabetes (HCC) - CMP14+EGFR  6. OSA (obstructive sleep apnea)  -  CMP14+EGFR  7. Vitamin B 12 deficiency  - CMP14+EGFR   Continue current medications  Continue Ozempic  0.5 mg given diarrhea and GI upset at times Low carb diet  Encourage healthy diet and exercise  Follow up in 3 months    Bari Learn, FNP

## 2024-11-04 LAB — CMP14+EGFR
ALT: 11 IU/L (ref 0–32)
AST: 14 IU/L (ref 0–40)
Albumin: 4 g/dL (ref 3.8–4.8)
Alkaline Phosphatase: 54 IU/L (ref 49–135)
BUN/Creatinine Ratio: 18 (ref 12–28)
BUN: 18 mg/dL (ref 8–27)
Bilirubin Total: 0.4 mg/dL (ref 0.0–1.2)
CO2: 26 mmol/L (ref 20–29)
Calcium: 9.4 mg/dL (ref 8.7–10.3)
Chloride: 100 mmol/L (ref 96–106)
Creatinine, Ser: 0.98 mg/dL (ref 0.57–1.00)
Globulin, Total: 2.1 g/dL (ref 1.5–4.5)
Glucose: 87 mg/dL (ref 70–99)
Potassium: 4 mmol/L (ref 3.5–5.2)
Sodium: 141 mmol/L (ref 134–144)
Total Protein: 6.1 g/dL (ref 6.0–8.5)
eGFR: 58 mL/min/1.73 — ABNORMAL LOW (ref >59)

## 2024-11-09 ENCOUNTER — Ambulatory Visit: Payer: Self-pay | Admitting: Family

## 2024-11-25 ENCOUNTER — Ambulatory Visit

## 2024-11-25 LAB — OPHTHALMOLOGY REPORT-SCANNED

## 2024-11-26 ENCOUNTER — Ambulatory Visit

## 2024-11-26 DIAGNOSIS — E538 Deficiency of other specified B group vitamins: Secondary | ICD-10-CM

## 2024-11-26 NOTE — Progress Notes (Signed)
 Patient is in office today for a nurse visit for B12 Injection. Patient Injection was given in the  Right deltoid. Patient tolerated injection well.

## 2024-12-22 ENCOUNTER — Ambulatory Visit (INDEPENDENT_AMBULATORY_CARE_PROVIDER_SITE_OTHER)

## 2024-12-22 ENCOUNTER — Telehealth: Payer: Self-pay

## 2024-12-22 VITALS — BP 118/76 | Ht 59.0 in | Wt 192.4 lb

## 2024-12-22 DIAGNOSIS — Z0000 Encounter for general adult medical examination without abnormal findings: Secondary | ICD-10-CM | POA: Diagnosis not present

## 2024-12-22 DIAGNOSIS — M19079 Primary osteoarthritis, unspecified ankle and foot: Secondary | ICD-10-CM

## 2024-12-22 NOTE — Telephone Encounter (Signed)
 Patient seen for AWV and she states that her insurance is requiring a referral to see specialists for 2026.  She is supposed to see orthopedics early February.  She goes to [facility] and sees Dr. Romona and Dr. Barton.  Please advise.

## 2024-12-22 NOTE — Progress Notes (Signed)
 "  Chief Complaint  Patient presents with   Medicare Wellness     Subjective:   Robin Lindsey is a 79 y.o. female who presents for a Medicare Annual Wellness Visit.  Visit info / Clinical Intake: Medicare Wellness Visit Type:: Subsequent Annual Wellness Visit Persons participating in visit and providing information:: patient Medicare Wellness Visit Mode:: In-person (required for WTM) Interpreter Needed?: No Pre-visit prep was completed: yes AWV questionnaire completed by patient prior to visit?: no Living arrangements:: lives with spouse/significant other Patient's Overall Health Status Rating: good Typical amount of pain: some Does pain affect daily life?: no Are you currently prescribed opioids?: no  Dietary Habits and Nutritional Risks How many meals a day?: 3 Eats fruit and vegetables daily?: yes Most meals are obtained by: preparing own meals In the last 2 weeks, have you had any of the following?: none Diabetic:: (!) yes Any non-healing wounds?: no How often do you check your BS?: 1 Would you like to be referred to a Nutritionist or for Diabetic Management? : no  Functional Status Activities of Daily Living (to include ambulation/medication): Independent Ambulation: Independent Medication Administration: Independent Home Management (perform basic housework or laundry): Independent Manage your own finances?: (!) no Primary transportation is: driving Concerns about vision?: no *vision screening is required for WTM* Concerns about hearing?: no  Fall Screening Falls in the past year?: 0 Number of falls in past year: 0 Was there an injury with Fall?: 0 Fall Risk Category Calculator: 0 Patient Fall Risk Level: Low Fall Risk  Fall Risk Patient at Risk for Falls Due to: No Fall Risks Fall risk Follow up: Falls evaluation completed; Education provided; Falls prevention discussed  Home and Transportation Safety: All rugs have non-skid backing?: yes All stairs or  steps have railings?: yes Grab bars in the bathtub or shower?: yes Have non-skid surface in bathtub or shower?: yes Good home lighting?: yes Regular seat belt use?: yes Hospital stays in the last year:: no  Cognitive Assessment Difficulty concentrating, remembering, or making decisions? : no Will 6CIT or Mini Cog be Completed: no 6CIT or Mini Cog Declined: patient alert, oriented, able to answer questions appropriately and recall recent events  Advance Directives (For Healthcare) Does Patient Have a Medical Advance Directive?: No Would patient like information on creating a medical advance directive?: Yes (MAU/Ambulatory/Procedural Areas - Information given)  Reviewed/Updated  Reviewed/Updated: Reviewed All (Medical, Surgical, Family, Medications, Allergies, Care Teams, Patient Goals)    Allergies (verified) Metformin  and related, Aspirin , and Nsaids   Current Medications (verified) Outpatient Encounter Medications as of 12/22/2024  Medication Sig   denosumab -bbdz (JUBBONTI ) 60 MG/ML SOSY injection Inject 60 mg into the skin every 6 (six) months.   empagliflozin  (JARDIANCE ) 10 MG TABS tablet Take 1 tablet (10 mg total) by mouth daily before breakfast.   LUMIGAN 0.01 % SOLN Place 1 drop into both eyes at bedtime.   meloxicam  (MOBIC ) 7.5 MG tablet Take 7.5 mg by mouth daily.   omeprazole  (PRILOSEC) 20 MG capsule TAKE ONE (1) CAPSULE BY MOUTH 2 TIMES DAILY   pravastatin  (PRAVACHOL ) 10 MG tablet Take 1 tablet (10 mg total) by mouth daily.   Semaglutide ,0.25 or 0.5MG /DOS, (OZEMPIC , 0.25 OR 0.5 MG/DOSE,) 2 MG/3ML SOPN Inject 0.5 mg into the skin once a week.   valsartan -hydrochlorothiazide  (DIOVAN -HCT) 160-25 MG tablet TAKE ONE (1) TABLET EACH DAY   Facility-Administered Encounter Medications as of 12/22/2024  Medication   cyanocobalamin  (VITAMIN B12) injection 1,000 mcg   denosumab  (PROLIA ) injection 60 mg  History: Past Medical History:  Diagnosis Date   Arthritis     Cataract    Diabetes mellitus without complication (HCC)    pt is not on diabetic medication   GERD (gastroesophageal reflux disease)    Glaucoma    worse in left eye than right eye   Heel pain    right heel pain   Hypertension    Osteoporosis    Personal history of colonic polyps 12/09/2012   Plantar fasciitis    right foot   Past Surgical History:  Procedure Laterality Date   BREAST LUMPECTOMY  10 + yrs.   left breast/benign   CATARACT EXTRACTION W/PHACO  02/14/2012   Procedure: CATARACT EXTRACTION PHACO AND INTRAOCULAR LENS PLACEMENT (IOC);  Surgeon: Cherene Mania, MD;  Location: [facility];  Service: Ophthalmology;  Laterality: Left;  CDE 13.73   CATARACT EXTRACTION W/PHACO  03/03/2012   Procedure: CATARACT EXTRACTION PHACO AND INTRAOCULAR LENS PLACEMENT (IOC);  Surgeon: Cherene Mania, MD;  Location: [facility];  Service: Ophthalmology;  Laterality: Right;  CDE: 16.49   COLONOSCOPY     FINGER SURGERY     percutaneous micrfasciotomy with topaz coblation wand  12/11/2015   RIGHT/LEFT HEART CATH AND CORONARY ANGIOGRAPHY N/A 05/22/2023   Procedure: RIGHT/LEFT HEART CATH AND CORONARY ANGIOGRAPHY;  Surgeon: Anner Alm ORN, MD;  Location: [facility];  Service: Cardiovascular;  Laterality: N/A;   rt foot surgery Right    Family History  Problem Relation Age of Onset   Colon polyps Mother    Diabetes Mother    Pulmonary fibrosis Mother    Colon cancer Maternal Aunt    Pulmonary fibrosis Maternal Aunt    Heart disease Father    Stroke Father    Dementia Father    Lung disease Father        realted to asbestos exposure   Thyroid  disease Brother    Diabetes Brother    Hypertension Brother    Meniere's disease Brother    Pulmonary fibrosis Maternal Aunt    Anesthesia problems Neg Hx    Hypotension Neg Hx    Malignant hyperthermia Neg Hx    Pseudochol deficiency Neg Hx    Social History   Occupational History   Occupation: retired  Tobacco Use   Smoking status: Former     Current packs/day: 0.00    Average packs/day: 1 pack/day for 5.0 years (5.0 ttl pk-yrs)    Types: Cigarettes    Start date: 03/10/1962    Quit date: 03/11/1967    Years since quitting: 57.8   Smokeless tobacco: Never   Tobacco comments:    smoked for 4-5 years from age 51- 50 years old  Vaping Use   Vaping status: Never Used  Substance and Sexual Activity   Alcohol use: No    Alcohol/week: 0.0 standard drinks of alcohol   Drug use: No   Sexual activity: Not Currently   Tobacco Counseling Counseling given: Not Answered Tobacco comments: smoked for 4-5 years from age 34- 40 years old  SDOH Screenings   Food Insecurity: No Food Insecurity (12/22/2024)  Housing: Low Risk (12/22/2024)  Transportation Needs: No Transportation Needs (12/22/2024)  Utilities: Not At Risk (12/22/2024)  Alcohol Screen: Low Risk (12/02/2023)  Depression (PHQ2-9): Low Risk (12/22/2024)  Financial Resource Strain: Low Risk (11/03/2024)  Physical Activity: Insufficiently Active (12/22/2024)  Social Connections: Socially Integrated (12/22/2024)  Stress: No Stress Concern Present (12/22/2024)  Tobacco Use: Medium Risk (12/22/2024)  Health Literacy: Adequate Health Literacy (12/22/2024)  See flowsheets for full screening details  Depression Screen PHQ 2 & 9 Depression Scale- Over the past 2 weeks, how often have you been bothered by any of the following problems? Little interest or pleasure in doing things: 0 Feeling down, depressed, or hopeless (PHQ Adolescent also includes...irritable): 0 PHQ-2 Total Score: 0 Trouble falling or staying asleep, or sleeping too much: 0 Feeling tired or having little energy: 0 Poor appetite or overeating (PHQ Adolescent also includes...weight loss): 0 Feeling bad about yourself - or that you are a failure or have let yourself or your family down: 0 Trouble concentrating on things, such as reading the newspaper or watching television (PHQ Adolescent also includes...like school work):  0 Moving or speaking so slowly that other people could have noticed. Or the opposite - being so fidgety or restless that you have been moving around a lot more than usual: 0 Thoughts that you would be better off dead, or of hurting yourself in some way: 0 PHQ-9 Total Score: 0 If you checked off any problems, how difficult have these problems made it for you to do your work, take care of things at home, or get along with other people?: Not difficult at all     Goals Addressed             This Visit's Progress    Maintain health and independence   On track            Objective:    Today's Vitals   12/22/24 1119  BP: 118/76  Weight: 192 lb 6.4 oz (87.3 kg)  Height: 4' 11 (1.499 m)   Body mass index is 38.86 kg/m.  Hearing/Vision screen Hearing Screening - Comments:: Patient is able to hear conversational tones without difficulty. No issues reported.    Vision Screening - Comments:: Wears rx glasses - up to date with routine eye exams with [facility]  Immunizations and Health Maintenance Health Maintenance  Topic Date Due   DTaP/Tdap/Td (1 - Tdap) Never done   COVID-19 Vaccine (5 - 2025-26 season) 08/10/2024   Influenza Vaccine  03/09/2025 (Originally 07/10/2024)   Diabetic kidney evaluation - Urine ACR  02/02/2025   FOOT EXAM  02/02/2025   HEMOGLOBIN A1C  02/03/2025   OPHTHALMOLOGY EXAM  06/17/2025   Bone Density Scan  09/05/2025   Diabetic kidney evaluation - eGFR measurement  11/03/2025   Medicare Annual Wellness (AWV)  12/22/2025   Colonoscopy  07/21/2026   Pneumococcal Vaccine: 50+ Years  Completed   Zoster Vaccines- Shingrix   Completed   Meningococcal B Vaccine  Aged Out   Hepatitis C Screening  Discontinued        Assessment/Plan:  This is a routine wellness examination for [place].  Patient Care Team: Lavell Bari LABOR, FNP as PCP - General (Family Medicine) Lonni Slain, MD as PCP - Cardiology (Cardiology) Octavia Charleston, MD as Consulting  Physician (Ophthalmology) Rosalynn LELON Ingle, MD (Inactive) as Consulting Physician (Obstetrics and Gynecology) Billee Mliss BIRCH, RPH-CPP as Pharmacist (Family Medicine) Romona Harari, MD as Consulting Physician (Orthopedic Surgery) Barton Drape, MD as Consulting Physician (Orthopedic Surgery) Mammography, [facility] (Diagnostic Radiology)  I have personally reviewed and noted the following in the patients chart:   Medical and social history Use of alcohol, tobacco or illicit drugs  Current medications and supplements including opioid prescriptions. Functional ability and status Nutritional status Physical activity Advanced directives List of other physicians Hospitalizations, surgeries, and ER visits in previous 12 months Vitals Screenings to include cognitive, depression,  and falls Referrals and appointments  No orders of the defined types were placed in this encounter.  In addition, I have reviewed and discussed with patient certain preventive protocols, quality metrics, and best practice recommendations. A written personalized care plan for preventive services as well as general preventive health recommendations were provided to patient.   Lavelle Charmaine Browner, LPN   [id number]/86/[id number]   Return in 1 year (on 12/22/2025).  After Visit Summary: (In Person-Printed) AVS printed and given to the patient  Nurse Notes: No voiced or noted concerns at this time Patient advised to keep follow-up appointment with PCP (02/04/25 @ 11:40) HM Addressed: Request sent for last diabetic eye exam   "

## 2024-12-22 NOTE — Telephone Encounter (Signed)
 Referral placed.

## 2024-12-22 NOTE — Patient Instructions (Addendum)
 Ms. Robin Lindsey,  Thank you for taking the time for your Medicare Wellness Visit. I appreciate your continued commitment to your health goals. Please review the care plan we discussed, and feel free to reach out if I can assist you further.  Please note that Annual Wellness Visits do not include a physical exam. Some assessments may be limited, especially if the visit was conducted virtually. If needed, we may recommend an in-person follow-up with your provider.  Ongoing Care Seeing your primary care provider every 3 to 6 months helps us  monitor your health and provide consistent, personalized care.   Referrals If a referral was made during today's visit and you haven't received any updates within two weeks, please contact the referred provider directly to check on the status.  Recommended Screenings:  Health Maintenance  Topic Date Due   DTaP/Tdap/Td vaccine (1 - Tdap) Never done   COVID-19 Vaccine (5 - 2025-26 season) 08/10/2024   Medicare Annual Wellness Visit  12/01/2024   Flu Shot  03/09/2025*   Yearly kidney health urinalysis for diabetes  02/02/2025   Complete foot exam   02/02/2025   Hemoglobin A1C  02/03/2025   Eye exam for diabetics  06/17/2025   Osteoporosis screening with Bone Density Scan  09/05/2025   Yearly kidney function blood test for diabetes  11/03/2025   Colon Cancer Screening  07/21/2026   Pneumococcal Vaccine for age over 76  Completed   Zoster (Shingles) Vaccine  Completed   Meningitis B Vaccine  Aged Out   Hepatitis C Screening  Discontinued  *Topic was postponed. The date shown is not the original due date.       12/02/2023    1:53 PM  Advanced Directives  Does Patient Have a Medical Advance Directive? No  Would patient like information on creating a medical advance directive? Yes (MAU/Ambulatory/Procedural Areas - Information given)   Information on Advanced Care Planning can be found at [place]  Secretary of [facility] Advance Health Care Directives  Advance Health Care Directives ([email or web address]   Vision: Annual vision screenings are recommended for early detection of glaucoma, cataracts, and diabetic retinopathy. These exams can also reveal signs of chronic conditions such as diabetes and high blood pressure.  Dental: Annual dental screenings help detect early signs of oral cancer, gum disease, and other conditions linked to overall health, including heart disease and diabetes.  Please see the attached documents for additional preventive care recommendations.

## 2024-12-28 ENCOUNTER — Ambulatory Visit: Admitting: *Deleted

## 2024-12-28 DIAGNOSIS — E538 Deficiency of other specified B group vitamins: Secondary | ICD-10-CM | POA: Diagnosis not present

## 2024-12-28 NOTE — Progress Notes (Signed)
Pt given B12 injection IM left deltoid and tolerated well. °

## 2025-01-29 ENCOUNTER — Ambulatory Visit

## 2025-02-04 ENCOUNTER — Ambulatory Visit: Admitting: Family
